# Patient Record
Sex: Male | Born: 1955 | Race: White | Hispanic: No | Marital: Single | Smoking: Never smoker
Health system: Southern US, Community
[De-identification: ages and names within clinical notes are randomized; demographics above are authoritative.]

## PROBLEM LIST (undated history)

## (undated) DIAGNOSIS — R6510 Systemic inflammatory response syndrome (SIRS) of non-infectious origin without acute organ dysfunction: Secondary | ICD-10-CM

## (undated) DIAGNOSIS — I4891 Unspecified atrial fibrillation: Secondary | ICD-10-CM

## (undated) DIAGNOSIS — M1990 Unspecified osteoarthritis, unspecified site: Secondary | ICD-10-CM

## (undated) DIAGNOSIS — G4730 Sleep apnea, unspecified: Secondary | ICD-10-CM

## (undated) DIAGNOSIS — I10 Essential (primary) hypertension: Secondary | ICD-10-CM

## (undated) DIAGNOSIS — I509 Heart failure, unspecified: Secondary | ICD-10-CM

## (undated) DIAGNOSIS — I499 Cardiac arrhythmia, unspecified: Secondary | ICD-10-CM

## (undated) DIAGNOSIS — D72829 Elevated white blood cell count, unspecified: Secondary | ICD-10-CM

## (undated) DIAGNOSIS — E119 Type 2 diabetes mellitus without complications: Secondary | ICD-10-CM

## (undated) DIAGNOSIS — N390 Urinary tract infection, site not specified: Secondary | ICD-10-CM

## (undated) DIAGNOSIS — E871 Hypo-osmolality and hyponatremia: Secondary | ICD-10-CM

## (undated) HISTORY — DX: Type 2 diabetes mellitus without complications: E11.9

## (undated) HISTORY — DX: Systemic inflammatory response syndrome (sirs) of non-infectious origin without acute organ dysfunction: R65.10

## (undated) HISTORY — DX: Elevated white blood cell count, unspecified: D72.829

## (undated) HISTORY — PX: TONSILLECTOMY: SUR1361

## (undated) HISTORY — PX: PENILE PROSTHESIS IMPLANT: SHX240

## (undated) HISTORY — PX: JOINT REPLACEMENT: SHX530

## (undated) HISTORY — DX: Cardiac arrhythmia, unspecified: I49.9

## (undated) HISTORY — PX: OTHER SURGICAL HISTORY: SHX169

## (undated) HISTORY — DX: Urinary tract infection, site not specified: N39.0

## (undated) HISTORY — DX: Essential (primary) hypertension: I10

## (undated) HISTORY — DX: Unspecified atrial fibrillation: I48.91

## (undated) HISTORY — DX: Hypo-osmolality and hyponatremia: E87.1

---

## 2004-11-02 ENCOUNTER — Other Ambulatory Visit: Payer: Self-pay

## 2004-11-03 ENCOUNTER — Other Ambulatory Visit: Payer: Self-pay

## 2004-11-03 ENCOUNTER — Inpatient Hospital Stay: Payer: Self-pay | Admitting: Anesthesiology

## 2007-09-05 ENCOUNTER — Other Ambulatory Visit: Payer: Self-pay

## 2007-09-05 ENCOUNTER — Ambulatory Visit: Payer: Self-pay | Admitting: Podiatry

## 2007-09-08 ENCOUNTER — Ambulatory Visit: Payer: Self-pay | Admitting: Podiatry

## 2010-03-20 ENCOUNTER — Ambulatory Visit: Payer: Self-pay | Admitting: Internal Medicine

## 2012-12-19 ENCOUNTER — Inpatient Hospital Stay: Payer: Self-pay | Admitting: Internal Medicine

## 2012-12-19 LAB — COMPREHENSIVE METABOLIC PANEL
Albumin: 2.1 g/dL — ABNORMAL LOW (ref 3.4–5.0)
Alkaline Phosphatase: 145 U/L — ABNORMAL HIGH (ref 50–136)
Anion Gap: 11 (ref 7–16)
Calcium, Total: 8.2 mg/dL — ABNORMAL LOW (ref 8.5–10.1)
Creatinine: 1.45 mg/dL — ABNORMAL HIGH (ref 0.60–1.30)
EGFR (Non-African Amer.): 53 — ABNORMAL LOW
Potassium: 4 mmol/L (ref 3.5–5.1)
SGOT(AST): 55 U/L — ABNORMAL HIGH (ref 15–37)
SGPT (ALT): 33 U/L (ref 12–78)
Total Protein: 7.2 g/dL (ref 6.4–8.2)

## 2012-12-19 LAB — URINALYSIS, COMPLETE
Glucose,UR: >=500 mg/dL (ref 0–75)
Nitrite: NEGATIVE
Protein: 100
Specific Gravity: 1.02 (ref 1.003–1.030)
Squamous Epithelial: <1
WBC UR: 409 /HPF (ref 0–5)

## 2012-12-19 LAB — CBC
HCT: 34.4 % — ABNORMAL LOW (ref 40.0–52.0)
RBC: 4.04 10*6/uL — ABNORMAL LOW (ref 4.40–5.90)

## 2012-12-19 LAB — LIPASE, BLOOD: Lipase: 66 U/L — ABNORMAL LOW (ref 73–393)

## 2012-12-19 LAB — PRO B NATRIURETIC PEPTIDE: B-Type Natriuretic Peptide: 2717 pg/mL — ABNORMAL HIGH (ref 0–125)

## 2012-12-19 LAB — CK TOTAL AND CKMB (NOT AT ARMC): CK, Total: 89 U/L (ref 35–232)

## 2012-12-20 DIAGNOSIS — I4891 Unspecified atrial fibrillation: Secondary | ICD-10-CM

## 2012-12-20 LAB — BASIC METABOLIC PANEL
Anion Gap: 11 (ref 7–16)
BUN: 20 mg/dL — ABNORMAL HIGH (ref 7–18)
Chloride: 91 mmol/L — ABNORMAL LOW (ref 98–107)
EGFR (African American): >60
Potassium: 3.4 mmol/L — ABNORMAL LOW (ref 3.5–5.1)
Sodium: 126 mmol/L — ABNORMAL LOW (ref 136–145)

## 2012-12-20 LAB — CBC WITH DIFFERENTIAL/PLATELET
Basophil #: 0.1 10*3/uL (ref 0.0–0.1)
Basophil %: 0.3 %
Eosinophil #: 0 10*3/uL (ref 0.0–0.7)
HCT: 37.9 % — ABNORMAL LOW (ref 40.0–52.0)
Lymphocyte #: 1.2 10*3/uL (ref 1.0–3.6)
MCH: 28.6 pg (ref 26.0–34.0)
MCHC: 33.2 g/dL (ref 32.0–36.0)
Monocyte #: 1.9 x10 3/mm — ABNORMAL HIGH (ref 0.2–1.0)
Monocyte %: 8 %
Neutrophil #: 20.6 10*3/uL — ABNORMAL HIGH (ref 1.4–6.5)
Neutrophil %: 86.3 %
Platelet: 240 10*3/uL (ref 150–440)
WBC: 23.8 10*3/uL — ABNORMAL HIGH (ref 3.8–10.6)

## 2012-12-20 LAB — TSH: Thyroid Stimulating Horm: 0.082 u[IU]/mL — ABNORMAL LOW

## 2012-12-21 DIAGNOSIS — I359 Nonrheumatic aortic valve disorder, unspecified: Secondary | ICD-10-CM

## 2012-12-21 LAB — BASIC METABOLIC PANEL
Anion Gap: 9 (ref 7–16)
BUN: 14 mg/dL (ref 7–18)
Co2: 25 mmol/L (ref 21–32)
EGFR (Non-African Amer.): >60
Osmolality: 263 (ref 275–301)
Potassium: 3.3 mmol/L — ABNORMAL LOW (ref 3.5–5.1)

## 2012-12-21 LAB — CBC WITH DIFFERENTIAL/PLATELET
Basophil #: 0 10*3/uL (ref 0.0–0.1)
Basophil %: 0.4 %
Eosinophil %: 0.9 %
HCT: 31.9 % — ABNORMAL LOW (ref 40.0–52.0)
HGB: 10.7 g/dL — ABNORMAL LOW (ref 13.0–18.0)
Lymphocyte #: 0.9 10*3/uL — ABNORMAL LOW (ref 1.0–3.6)
Lymphocyte %: 7.4 %
MCH: 28.8 pg (ref 26.0–34.0)
Monocyte %: 9.9 %
Neutrophil #: 9.6 10*3/uL — ABNORMAL HIGH (ref 1.4–6.5)
Platelet: 167 10*3/uL (ref 150–440)
RBC: 3.73 10*6/uL — ABNORMAL LOW (ref 4.40–5.90)

## 2012-12-21 LAB — MAGNESIUM: Magnesium: 1.6 mg/dL — ABNORMAL LOW

## 2012-12-21 LAB — T4, FREE: Free Thyroxine: 1.47 ng/dL — ABNORMAL HIGH (ref 0.76–1.46)

## 2012-12-22 DIAGNOSIS — I4891 Unspecified atrial fibrillation: Secondary | ICD-10-CM

## 2012-12-22 LAB — BASIC METABOLIC PANEL
Anion Gap: 6 — ABNORMAL LOW (ref 7–16)
BUN: 8 mg/dL (ref 7–18)
Calcium, Total: 7.7 mg/dL — ABNORMAL LOW (ref 8.5–10.1)
Chloride: 99 mmol/L (ref 98–107)
Creatinine: 0.78 mg/dL (ref 0.60–1.30)
EGFR (Non-African Amer.): >60
Sodium: 132 mmol/L — ABNORMAL LOW (ref 136–145)

## 2012-12-22 LAB — CBC WITH DIFFERENTIAL/PLATELET
Eosinophil #: 0.1 10*3/uL (ref 0.0–0.7)
HCT: 32.8 % — ABNORMAL LOW (ref 40.0–52.0)
Lymphocyte #: 0.9 10*3/uL — ABNORMAL LOW (ref 1.0–3.6)
MCH: 28.6 pg (ref 26.0–34.0)
MCHC: 33.4 g/dL (ref 32.0–36.0)
MCV: 86 fL (ref 80–100)
Monocyte #: 1.1 x10 3/mm — ABNORMAL HIGH (ref 0.2–1.0)
Neutrophil #: 8 10*3/uL — ABNORMAL HIGH (ref 1.4–6.5)
Neutrophil %: 78 %
Platelet: 198 10*3/uL (ref 150–440)
RBC: 3.83 10*6/uL — ABNORMAL LOW (ref 4.40–5.90)
RDW: 14.8 % — ABNORMAL HIGH (ref 11.5–14.5)

## 2012-12-22 LAB — STOOL CULTURE

## 2012-12-22 LAB — MAGNESIUM: Magnesium: 1.5 mg/dL — ABNORMAL LOW

## 2012-12-23 LAB — BASIC METABOLIC PANEL
BUN: 7 mg/dL (ref 7–18)
Calcium, Total: 8.2 mg/dL — ABNORMAL LOW (ref 8.5–10.1)
Chloride: 101 mmol/L (ref 98–107)
Co2: 27 mmol/L (ref 21–32)
Creatinine: 0.76 mg/dL (ref 0.60–1.30)
EGFR (Non-African Amer.): >60
Glucose: 92 mg/dL (ref 65–99)
Sodium: 134 mmol/L — ABNORMAL LOW (ref 136–145)

## 2012-12-23 LAB — CBC WITH DIFFERENTIAL/PLATELET
Basophil #: 0.1 10*3/uL (ref 0.0–0.1)
Basophil %: 1 %
Eosinophil #: 0.3 10*3/uL (ref 0.0–0.7)
Eosinophil %: 2.5 %
HCT: 33.4 % — ABNORMAL LOW (ref 40.0–52.0)
HGB: 11.2 g/dL — ABNORMAL LOW (ref 13.0–18.0)
MCH: 29.1 pg (ref 26.0–34.0)
Monocyte #: 1.1 x10 3/mm — ABNORMAL HIGH (ref 0.2–1.0)
Monocyte %: 10.1 %
Neutrophil #: 8.1 10*3/uL — ABNORMAL HIGH (ref 1.4–6.5)
Neutrophil %: 71.6 %
RBC: 3.85 10*6/uL — ABNORMAL LOW (ref 4.40–5.90)
RDW: 15 % — ABNORMAL HIGH (ref 11.5–14.5)
WBC: 11.3 10*3/uL — ABNORMAL HIGH (ref 3.8–10.6)

## 2012-12-23 LAB — MAGNESIUM: Magnesium: 1.6 mg/dL — ABNORMAL LOW

## 2012-12-23 LAB — DIGOXIN LEVEL: Digoxin: 0.77 ng/mL

## 2012-12-24 LAB — T4, FREE: Free Thyroxine: 1.08 ng/dL (ref 0.76–1.46)

## 2012-12-24 LAB — TSH: Thyroid Stimulating Horm: 0.615 u[IU]/mL

## 2012-12-25 DIAGNOSIS — I359 Nonrheumatic aortic valve disorder, unspecified: Secondary | ICD-10-CM

## 2012-12-25 LAB — BASIC METABOLIC PANEL
BUN: 10 mg/dL (ref 7–18)
Calcium, Total: 8.7 mg/dL (ref 8.5–10.1)
Co2: 30 mmol/L (ref 21–32)
EGFR (Non-African Amer.): >60
Glucose: 66 mg/dL (ref 65–99)
Osmolality: 273 (ref 275–301)
Sodium: 138 mmol/L (ref 136–145)

## 2012-12-25 LAB — CBC WITH DIFFERENTIAL/PLATELET
Basophil #: 0.1 10*3/uL (ref 0.0–0.1)
Eosinophil #: 0.4 10*3/uL (ref 0.0–0.7)
Eosinophil %: 3.3 %
HCT: 35 % — ABNORMAL LOW (ref 40.0–52.0)
Lymphocyte #: 1.4 10*3/uL (ref 1.0–3.6)
Lymphocyte %: 12.1 %
MCH: 28.6 pg (ref 26.0–34.0)
MCHC: 33 g/dL (ref 32.0–36.0)
MCV: 87 fL (ref 80–100)
Monocyte #: 1 x10 3/mm (ref 0.2–1.0)
Monocyte %: 8.1 %
Neutrophil #: 8.9 10*3/uL — ABNORMAL HIGH (ref 1.4–6.5)
Platelet: 385 10*3/uL (ref 150–440)
RBC: 4.03 10*6/uL — ABNORMAL LOW (ref 4.40–5.90)
WBC: 11.8 10*3/uL — ABNORMAL HIGH (ref 3.8–10.6)

## 2012-12-25 LAB — MAGNESIUM: Magnesium: 1.9 mg/dL

## 2012-12-26 ENCOUNTER — Telehealth: Payer: Self-pay

## 2012-12-26 LAB — CULTURE, BLOOD (SINGLE)

## 2012-12-26 NOTE — Telephone Encounter (Signed)
tcm

## 2012-12-26 NOTE — Telephone Encounter (Signed)
Message copied by [facility], Idella Lamontagne E on Tue Dec 26, 2012  2:04 PM ------      Message from: Coralee Rud      Created: Tue Dec 26, 2012  1:58 PM      Regarding: tcm       appt 01/04/13 ------

## 2012-12-27 NOTE — Telephone Encounter (Signed)
TCM I was able to speak with pt re: recent d/c from [facility] for atrial fib He confirms compliance with medications as prescribed He was d/c yesterday and is on his way to pharmacy now to get RXs Denies sob, palpitations or CP Has HH nurse coming to home today between 3-4 pm for IV abx admin. Teaching He confirms knowledge re: appt with [place] 4/17 He will call us prior to appt should he have any other concerns

## 2013-01-04 ENCOUNTER — Encounter: Payer: Self-pay | Admitting: Cardiovascular Disease

## 2013-01-04 ENCOUNTER — Ambulatory Visit (INDEPENDENT_AMBULATORY_CARE_PROVIDER_SITE_OTHER): Payer: BC Managed Care – PPO | Admitting: Cardiovascular Disease

## 2013-01-04 VITALS — BP 150/80 | HR 69 | Ht 75.0 in | Wt 263.8 lb

## 2013-01-04 DIAGNOSIS — R7881 Bacteremia: Secondary | ICD-10-CM | POA: Insufficient documentation

## 2013-01-04 DIAGNOSIS — R6510 Systemic inflammatory response syndrome (SIRS) of non-infectious origin without acute organ dysfunction: Secondary | ICD-10-CM

## 2013-01-04 DIAGNOSIS — I4891 Unspecified atrial fibrillation: Secondary | ICD-10-CM

## 2013-01-04 DIAGNOSIS — E119 Type 2 diabetes mellitus without complications: Secondary | ICD-10-CM

## 2013-01-04 DIAGNOSIS — I10 Essential (primary) hypertension: Secondary | ICD-10-CM | POA: Insufficient documentation

## 2013-01-04 MED ORDER — METOPROLOL TARTRATE 25 MG PO TABS: 25.0000 mg | ORAL_TABLET | Freq: Two times a day (BID) | ORAL | Status: DC

## 2013-01-04 MED ORDER — GLIPIZIDE 5 MG PO TABS: 5.0000 mg | ORAL_TABLET | Freq: Two times a day (BID) | ORAL | Status: DC

## 2013-01-04 MED ORDER — HYDROCHLOROTHIAZIDE 25 MG PO TABS: 50.0000 mg | ORAL_TABLET | Freq: Every day | ORAL | Status: DC

## 2013-01-04 MED ORDER — DILTIAZEM HCL ER BEADS 300 MG PO CP24: 300.0000 mg | ORAL_CAPSULE | Freq: Every day | ORAL | Status: DC

## 2013-01-04 MED ORDER — ENALAPRIL MALEATE 20 MG PO TABS: 20.0000 mg | ORAL_TABLET | Freq: Every day | ORAL | Status: DC

## 2013-01-04 MED ORDER — INSULIN DETEMIR 100 UNIT/ML FLEXPEN: 30.0000 [IU] | Freq: Every day | SUBCUTANEOUS | Status: DC

## 2013-01-04 MED ORDER — AMIODARONE HCL 200 MG PO TABS: 200.0000 mg | ORAL_TABLET | Freq: Every day | ORAL | Status: DC

## 2013-01-04 NOTE — Assessment & Plan Note (Signed)
Upper respiratory infection followed by an GI pathology, severe diarrhea. Bacteremia and hypotension  Requiring antibiotics.

## 2013-01-04 NOTE — Assessment & Plan Note (Signed)
Maintaining normal sinus rhythm. We will decrease the amiodarone to 200 mg daily, continue diltiazem and metoprolol. We will hold the anticoagulation at the end of the month as this was a single episode of atrial fibrillation that was short lived and converted on medication.

## 2013-01-04 NOTE — Assessment & Plan Note (Signed)
Continue current medications. We have encouraged he watch his blood pressure at home.

## 2013-01-04 NOTE — Assessment & Plan Note (Signed)
Severe illness recently with SIRS. Improved on antibiotics. TEE suggesting no endocarditis. He did have a PICC line which was recently removed after antibiotics was complete.

## 2013-01-04 NOTE — Progress Notes (Signed)
Patient ID: Raymond Avery, male    DOB: Feb 10, 1956, 57 y.o.   MRN: [id number]  HPI Comments: Mr. Benish is a very pleasant 57 year old gentleman currently with no primary care physician, poorly controlled diabetes who presented to the hospital 12/19/2012 with diarrhea, dysuria, SIRS, developing atrial fibrillation with RVR on April 2. We will consult for management of his arrhythmia. He presents to the clinic today to establish care.  As an outpatient, he had upper respiratory infection, took his son's amoxicillin. Developed diarrhea that was profuse with a dark color. Went to urgent care and was sent to the emergency room. He was tachycardic with heart rates in the 120-130 range, dehydration with hyponatremia, urinary tract infection by report. He was given IV fluids. Developed atrial fibrillation with RVR. He was started on diltiazem IV and digoxin for heart rate of 140. Magnesium and potassium was repleted, he was started on amiodarone as rate did not improve. He converted to normal sinus rhythm and was continued on these medications as an outpatient.  Echocardiogram done in the hospital showed severe asymmetric LVH septal greater than posterior wall, moderately dilated left atrium, ejection fraction 50-55%.  TEE was performed to rule out endocarditis. This did not show any vegetation  Cardiac catheterization February 2006 showing no significant CAD, ejection fraction 40%  Today he reports that he feels well. He is unable to afford the xarelto which cost him $100. He is also on insulin which is costing him $100. He denies any tachycardia or palpitations.  EKG today showing normal sinus rhythm with rate 69 beats per minute, no significant ST or T wave changes, left axis deviation   Outpatient Encounter Prescriptions as of 01/04/2013  Medication Sig Dispense Refill  . amiodarone (PACERONE) 200 MG tablet Take 1 tablet (200 mg total) by mouth daily.  90 tablet  3  . diltiazem (TIAZAC) 300 MG 24  hr capsule Take 1 capsule (300 mg total) by mouth daily.  90 capsule  3  . enalapril (VASOTEC) 20 MG tablet Take 1 tablet (20 mg total) by mouth daily.  90 tablet  3  . glipiZIDE (GLUCOTROL) 5 MG tablet Take 1 tablet (5 mg total) by mouth 2 (two) times daily before a meal.  180 tablet  3  . hydrochlorothiazide (HYDRODIURIL) 25 MG tablet Take 2 tablets (50 mg total) by mouth daily.  90 tablet  3  . insulin detemir (LEVEMIR) 100 unit/ml SOLN Inject 30 Units into the skin daily at 10 pm.  1 mL  6  . metoprolol tartrate (LOPRESSOR) 25 MG tablet Take 1 tablet (25 mg total) by mouth 2 (two) times daily.  180 tablet  3  . Rivaroxaban (XARELTO) 20 MG TABS Take 20 mg by mouth daily.      Marland Kitchen  amiodarone (PACERONE) 400 MG tablet Take 400 mg by mouth daily.      . [DISCONTINUED] dextrose 5 % SOLN 50 mL with cefTRIAXone 2 G SOLR 2 g Inject 2 g into the vein daily.      . [DISCONTINUED] diltiazem (TIAZAC) 300 MG 24 hr capsule Take 300 mg by mouth daily.      . [DISCONTINUED] enalapril (VASOTEC) 20 MG tablet Take 20 mg by mouth daily.      . [DISCONTINUED] glipiZIDE (GLUCOTROL) 5 MG tablet Take 5 mg by mouth 2 (two) times daily before a meal.      . [DISCONTINUED] hydrochlorothiazide (HYDRODIURIL) 25 MG tablet Take 50 mg by mouth daily.       . [  DISCONTINUED] insulin detemir (LEVEMIR) 100 unit/ml SOLN Inject 30 Units into the skin daily at 10 pm.      . [DISCONTINUED] metoprolol tartrate (LOPRESSOR) 25 MG tablet Take 25 mg by mouth 2 (two) times daily.       No facility-administered encounter medications on file as of 01/04/2013.     Review of Systems  Constitutional: Negative.   HENT: Negative.   Eyes: Negative.   Respiratory: Negative.   Cardiovascular: Negative.   Gastrointestinal: Negative.   Musculoskeletal: Negative.   Skin: Negative.   Neurological: Negative.   Psychiatric/Behavioral: Negative.   All other systems reviewed and are negative.    BP 150/80  Pulse 69  Ht 6\' 3"  (1.905 m)  Wt  263 lb 12 oz (119.636 kg)  BMI 32.97 kg/m2  Physical Exam  Nursing note and vitals reviewed. Constitutional: He is oriented to person, place, and time. He appears well-developed and well-nourished.  HENT:  Head: Normocephalic.  Nose: Nose normal.  Mouth/Throat: Oropharynx is clear and moist.  Eyes: Conjunctivae are normal. Pupils are equal, round, and reactive to light.  Neck: Normal range of motion. Neck supple. No JVD present.  Cardiovascular: Normal rate, regular rhythm, S1 normal, S2 normal, normal heart sounds and intact distal pulses.  Exam reveals no gallop and no friction rub.   No murmur heard. Pulmonary/Chest: Effort normal and breath sounds normal. No respiratory distress. He has no wheezes. He has no rales. He exhibits no tenderness.  Abdominal: Soft. Bowel sounds are normal. He exhibits no distension. There is no tenderness.  Musculoskeletal: Normal range of motion. He exhibits no edema and no tenderness.  Lymphadenopathy:    He has no cervical adenopathy.  Neurological: He is alert and oriented to person, place, and time. Coordination normal.  Skin: Skin is warm and dry. No rash noted. No erythema.  Psychiatric: He has a normal mood and affect. His behavior is normal. Judgment and thought content normal.      Assessment and Plan

## 2013-01-04 NOTE — Patient Instructions (Addendum)
You are doing well. Please decrease amiodarone to 1/2 daily (200 mg)  Please stop the xarelto once the script runs out  Please call us if you have new issues that need to be addressed before your next appt.  Your physician wants you to follow-up in: 6 months.  You will receive a reminder letter in the mail two months in advance. If you don't receive a letter, please call our office to schedule the follow-up appointment.

## 2013-01-04 NOTE — Assessment & Plan Note (Addendum)
He'll continue on insulin for now. We have suggested he try to obtain a primary care physician in the very near future for management of his diabetes.we will renew his medications and encouraged him to actively call primary care physicians in [place] or [place] for further management.

## 2013-01-19 ENCOUNTER — Other Ambulatory Visit: Payer: Self-pay

## 2013-01-19 MED ORDER — ENALAPRIL MALEATE 20 MG PO TABS: 20.0000 mg | ORAL_TABLET | Freq: Every day | ORAL | Status: DC

## 2013-01-19 MED ORDER — GLIPIZIDE 5 MG PO TABS: 5.0000 mg | ORAL_TABLET | Freq: Two times a day (BID) | ORAL | Status: DC

## 2013-01-19 MED ORDER — INSULIN DETEMIR 100 UNIT/ML FLEXPEN: 30.0000 [IU] | Freq: Every day | SUBCUTANEOUS | Status: DC

## 2013-01-19 MED ORDER — HYDROCHLOROTHIAZIDE 25 MG PO TABS: 50.0000 mg | ORAL_TABLET | Freq: Every day | ORAL | Status: DC

## 2013-01-19 MED ORDER — METOPROLOL TARTRATE 25 MG PO TABS: 25.0000 mg | ORAL_TABLET | Freq: Two times a day (BID) | ORAL | Status: DC

## 2013-01-19 NOTE — Telephone Encounter (Signed)
Refilled Enalapril, Levemir, glipizide, Hydrochlorothiazide, and metoprolol sent to [facility].

## 2013-01-19 NOTE — Telephone Encounter (Signed)
Pt wants Levemir switched to generic. States he went to [facility] and these were not filled.

## 2013-05-14 ENCOUNTER — Inpatient Hospital Stay: Payer: Self-pay | Admitting: Podiatry

## 2013-05-14 DIAGNOSIS — Z01810 Encounter for preprocedural cardiovascular examination: Secondary | ICD-10-CM

## 2013-05-14 LAB — CBC WITH DIFFERENTIAL/PLATELET
Eosinophil %: 3.2 %
HCT: 36.2 % — ABNORMAL LOW (ref 40.0–52.0)
Lymphocyte %: 15.4 %
MCH: 29.9 pg (ref 26.0–34.0)
Monocyte %: 7.7 %
Neutrophil #: 7.7 10*3/uL — ABNORMAL HIGH (ref 1.4–6.5)
Neutrophil %: 73 %
Platelet: 365 10*3/uL (ref 150–440)

## 2013-05-14 LAB — BASIC METABOLIC PANEL
BUN: 9 mg/dL (ref 7–18)
Chloride: 103 mmol/L (ref 98–107)
EGFR (African American): >60
EGFR (Non-African Amer.): >60
Glucose: 129 mg/dL — ABNORMAL HIGH (ref 65–99)
Osmolality: 276 (ref 275–301)
Potassium: 4.3 mmol/L (ref 3.5–5.1)

## 2013-05-15 LAB — LIPID PANEL
Ldl Cholesterol, Calc: 62 mg/dL (ref 0–100)
Triglycerides: 75 mg/dL (ref 0–200)

## 2013-05-16 LAB — CBC WITH DIFFERENTIAL/PLATELET
Basophil #: 0.1 10*3/uL (ref 0.0–0.1)
Basophil %: 0.5 %
Eosinophil #: 0.2 10*3/uL (ref 0.0–0.7)
Eosinophil %: 1.9 %
Lymphocyte #: 1.4 10*3/uL (ref 1.0–3.6)
Lymphocyte %: 12.4 %
MCV: 87 fL (ref 80–100)
Monocyte #: 1.5 x10 3/mm — ABNORMAL HIGH (ref 0.2–1.0)
Neutrophil %: 72.1 %
RDW: 13.6 % (ref 11.5–14.5)
WBC: 11.6 10*3/uL — ABNORMAL HIGH (ref 3.8–10.6)

## 2013-05-17 LAB — BASIC METABOLIC PANEL
Anion Gap: 8 (ref 7–16)
Chloride: 101 mmol/L (ref 98–107)
Co2: 25 mmol/L (ref 21–32)
Creatinine: 1.14 mg/dL (ref 0.60–1.30)
Glucose: 178 mg/dL — ABNORMAL HIGH (ref 65–99)
Osmolality: 272 (ref 275–301)
Sodium: 134 mmol/L — ABNORMAL LOW (ref 136–145)

## 2013-05-17 LAB — CBC WITH DIFFERENTIAL/PLATELET
Eosinophil #: 0.3 10*3/uL (ref 0.0–0.7)
HGB: 13.3 g/dL (ref 13.0–18.0)
Lymphocyte #: 1.7 10*3/uL (ref 1.0–3.6)
Lymphocyte %: 17.7 %
MCH: 30.6 pg (ref 26.0–34.0)
MCV: 87 fL (ref 80–100)
Monocyte #: 1.2 x10 3/mm — ABNORMAL HIGH (ref 0.2–1.0)
Monocyte %: 12.9 %
Neutrophil #: 6.2 10*3/uL (ref 1.4–6.5)
Neutrophil %: 65.4 %
Platelet: 314 10*3/uL (ref 150–440)
RBC: 4.34 10*6/uL — ABNORMAL LOW (ref 4.40–5.90)
RDW: 13.7 % (ref 11.5–14.5)
WBC: 9.5 10*3/uL (ref 3.8–10.6)

## 2013-05-17 LAB — PATHOLOGY REPORT

## 2013-05-18 LAB — CBC WITH DIFFERENTIAL/PLATELET
Basophil #: 0.1 10*3/uL (ref 0.0–0.1)
Basophil %: 0.7 %
Eosinophil #: 0.5 10*3/uL (ref 0.0–0.7)
Lymphocyte %: 21.6 %
MCH: 29.9 pg (ref 26.0–34.0)
MCHC: 34.6 g/dL (ref 32.0–36.0)
MCV: 86 fL (ref 80–100)
Monocyte #: 1.2 x10 3/mm — ABNORMAL HIGH (ref 0.2–1.0)
Monocyte %: 13.5 %
Neutrophil #: 5.2 10*3/uL (ref 1.4–6.5)
Platelet: 355 10*3/uL (ref 150–440)
RDW: 13.6 % (ref 11.5–14.5)
WBC: 8.8 10*3/uL (ref 3.8–10.6)

## 2013-05-18 LAB — CREATININE, SERUM: EGFR (Non-African Amer.): >60

## 2013-05-20 LAB — WOUND CULTURE

## 2013-08-16 ENCOUNTER — Other Ambulatory Visit: Payer: Self-pay | Admitting: Cardiovascular Disease

## 2013-09-25 ENCOUNTER — Observation Stay: Payer: Self-pay | Admitting: Internal Medicine

## 2013-09-25 LAB — COMPREHENSIVE METABOLIC PANEL
ALK PHOS: 89 U/L
ALT: 20 U/L (ref 12–78)
Albumin: 3.5 g/dL (ref 3.4–5.0)
Anion Gap: 4 — ABNORMAL LOW (ref 7–16)
BUN: 13 mg/dL (ref 7–18)
Bilirubin,Total: 0.8 mg/dL (ref 0.2–1.0)
CO2: 28 mmol/L (ref 21–32)
CREATININE: 1.07 mg/dL (ref 0.60–1.30)
Calcium, Total: 9.4 mg/dL (ref 8.5–10.1)
Chloride: 101 mmol/L (ref 98–107)
EGFR (African American): >60
EGFR (Non-African Amer.): >60
Glucose: 158 mg/dL — ABNORMAL HIGH (ref 65–99)
OSMOLALITY: 270 (ref 275–301)
POTASSIUM: 4.2 mmol/L (ref 3.5–5.1)
SGOT(AST): 22 U/L (ref 15–37)
Sodium: 133 mmol/L — ABNORMAL LOW (ref 136–145)
Total Protein: 8 g/dL (ref 6.4–8.2)

## 2013-09-25 LAB — CBC
HCT: 49.8 % (ref 40.0–52.0)
HGB: 17.1 g/dL (ref 13.0–18.0)
MCH: 30.7 pg (ref 26.0–34.0)
MCHC: 34.3 g/dL (ref 32.0–36.0)
MCV: 90 fL (ref 80–100)
Platelet: 251 10*3/uL (ref 150–440)
RBC: 5.55 10*6/uL (ref 4.40–5.90)
RDW: 13 % (ref 11.5–14.5)
WBC: 8.7 10*3/uL (ref 3.8–10.6)

## 2013-11-16 DIAGNOSIS — N182 Chronic kidney disease, stage 2 (mild): Secondary | ICD-10-CM

## 2013-11-16 DIAGNOSIS — E1122 Type 2 diabetes mellitus with diabetic chronic kidney disease: Secondary | ICD-10-CM | POA: Insufficient documentation

## 2013-12-01 DIAGNOSIS — I10 Essential (primary) hypertension: Secondary | ICD-10-CM

## 2013-12-01 DIAGNOSIS — E1159 Type 2 diabetes mellitus with other circulatory complications: Secondary | ICD-10-CM | POA: Insufficient documentation

## 2013-12-01 DIAGNOSIS — I152 Hypertension secondary to endocrine disorders: Secondary | ICD-10-CM | POA: Insufficient documentation

## 2013-12-03 DIAGNOSIS — R809 Proteinuria, unspecified: Secondary | ICD-10-CM | POA: Insufficient documentation

## 2014-01-27 IMAGING — XA IR VASCULAR PROCEDURE
1 series · 1 of 1 positions shown · non-contrast
Comparison: none

[Series 1: single · 1 of 1 slices shown]
[im 1/1]
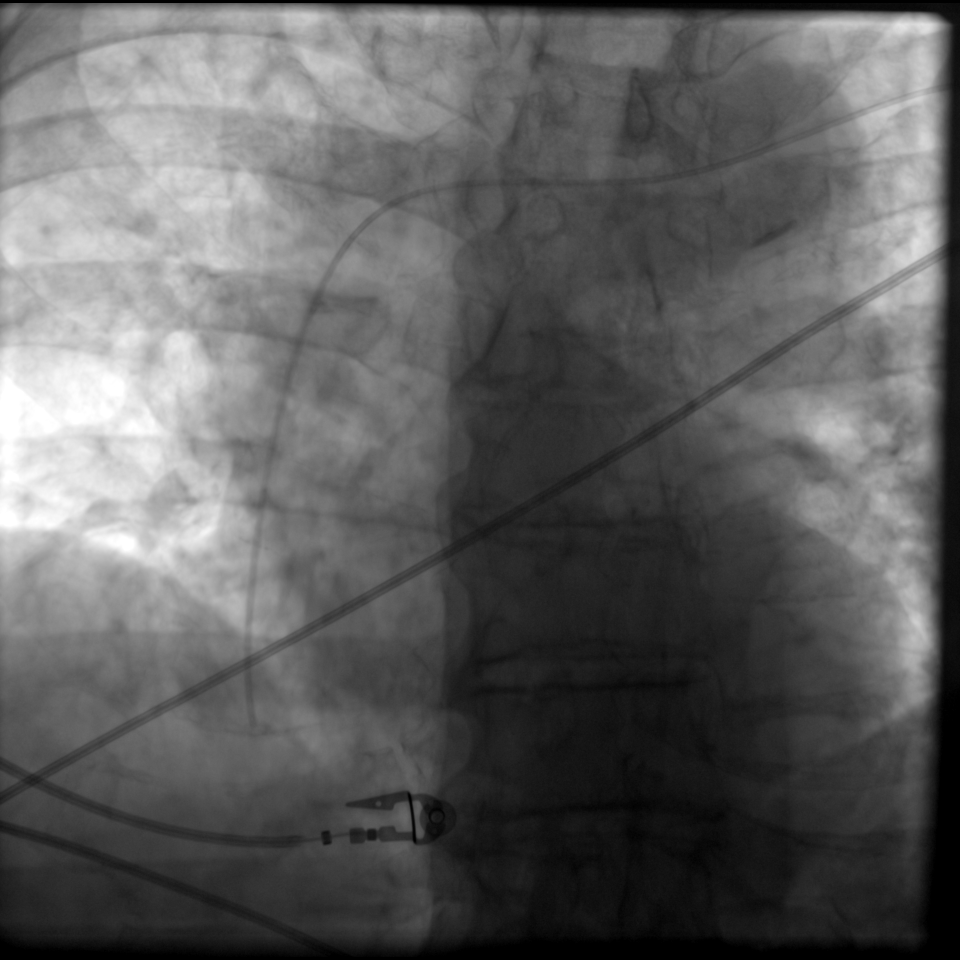

[1 of 1 positions shown; findings below may reference images not displayed]

IMAGES IMPORTED FROM THE SYNGO WORKFLOW SYSTEM
NO DICTATION FOR STUDY

## 2014-04-16 DIAGNOSIS — N529 Male erectile dysfunction, unspecified: Secondary | ICD-10-CM | POA: Insufficient documentation

## 2014-05-24 DIAGNOSIS — M1710 Unilateral primary osteoarthritis, unspecified knee: Secondary | ICD-10-CM | POA: Insufficient documentation

## 2014-08-19 DIAGNOSIS — E1169 Type 2 diabetes mellitus with other specified complication: Secondary | ICD-10-CM | POA: Insufficient documentation

## 2014-08-19 DIAGNOSIS — N521 Erectile dysfunction due to diseases classified elsewhere: Secondary | ICD-10-CM

## 2015-01-10 NOTE — Op Note (Signed)
PATIENT NAME:  Raymond Avery, Raymond Avery MR#:  [id number] DATE OF BIRTH:  01-22-1956  DATE OF PROCEDURE:  05/15/2013  PREOPERATIVE DIAGNOSIS: Gangrene with osteomyelitis, right great toe.   POSTOPERATIVE DIAGNOSIS: Gangrene with osteomyelitis, right great toe.   PROCEDURE: Amputation, right great toe.   SURGEON: Ricci Barkerodd W. Dashiell Franchino, DPM.   ANESTHESIA: Local MAC.   HEMOSTASIS: Pneumatic tourniquet, right ankle, 250 mmHg.   ESTIMATED BLOOD LOSS: 25 mL.   PATHOLOGY: Gangrenous right great toe.   CULTURES: Bone cultures proximal phalanx, right hallux.   DRAINS: None.   COMPLICATIONS: None apparent.   OPERATIVE INDICATIONS: This is a 59 year old, noncompliant diabetic male with a long history of ulceration which subsequently went into gangrene and osteomyelitis. He was admitted for surgical amputation and IV antibiotics.   OPERATIVE PROCEDURE: The patient was taken to the Operating Room and placed on the table in the supine position. Following satisfactory sedation, the right foot was anesthetized with 18 mL of 0.5% Sensorcaine plain around the first metatarsal. A pneumatic tourniquet was applied at the level of the right ankle and the foot was prepped and draped in the usual sterile fashion. The foot was exsanguinated and the tourniquet inflated to 250 mmHg.   Attention was then directed to the distal aspect of the right foot where a fishmouth incision was made coursing medial to lateral around the base of the great toe. The incision was carried sharply down to the level of bone and dissection carried back to the level of the metatarsophalangeal joint, where the toe was completely disarticulated and removed. There was noted to be good healthy bleeding tissues for the entirety of the wound. Some arthritic changes were noted at the first MTPJ and some bony prominence was removed from medial, lateral and dorsal aspect of the joint. The wound was then flushed with copious amounts of sterile saline containing GU  irrigation and then closed using 4-0 nylon vertical mattress and simple interrupted sutures. Xeroform, 4 x 4's, ABD and Kerlix were then applied to the right foot followed by an Ace wrap. The tourniquet was released and blood flow noted to return immediately to the right foot. The patient tolerated the procedure and anesthesia well and was transported to the PACU with vital signs stable and in good condition.    ____________________________ Raymond Avery Taneia Mealor, DPM tc:jm D: 05/15/2013 13:29:40 ET T: 05/15/2013 14:10:36 ET JOB#: [id number]  cc: Raymond Avery Randie Bloodgood, DPM, <Dictator> Kurstyn Larios DPM ELECTRONICALLY SIGNED 05/26/2013 10:01

## 2015-01-10 NOTE — Consult Note (Signed)
PATIENT NAME:  Raymond Avery, Raymond Avery MR#:  [id number] DATE OF BIRTH:  January 08, 1956  DATE OF CONSULTATION:  05/17/2013  REFERRING PHYSICIAN:  Dr. Cleda Mccreedy in podiatry CONSULTING PHYSICIAN:  Cheral Marker. Ola Spurr, MD  REASON FOR CONSULT:  Osteomyelitis status post amputation of great toe.   HISTORY OF PRESENT ILLNESS: This is a 59 year old, poorly controlled diabetic who has no primary care physician. He developed an ulceration on his right great toe that started to become infected. He was initially treated with amoxicillin, but the lesion turned black and he was seen by podiatry.  He was admitted with August 25 and underwent amputation of the great toe on August 26. Cultures are sent and are pending and are mixed. We are consulted for further antibiotic management. Since then, the wound has been dressed and apparently has been stable. He has had no fevers. He just really wants to go home tomorrow and hopes she can be treated with oral antibiotic as opposed to IV.  He states he had IV PICC line in April of this year, but I find no record of this and he has no further recollection of what was done at that time.   PAST MEDICAL HISTORY: 1.  Poorly controlled diabetes.  2.  Hypertension.   3.  History of atrial fibrillation.   PAST SURGICAL HISTORY:  None.     SOCIAL HISTORY: The patient quit smoking many years ago. He drinks 2 to 4 beers daily. He lives at home with his son. He is a Designer, fashion/clothing.   FAMILY HISTORY: Positive for mother with myocardial infarction, father with colon cancer.   REVIEW OF SYSTEMS:  Eleven systems reviewed and negative, except as per HPI.    CURRENT ANTIBIOTICS:  Current antibiotics include vancomycin began on August 25.     OTHER MEDICATIONS: Include: Amiodarone, enalapril, hydralazine, sliding scale insulin, metoprolol, morphine, hydrochlorothiazide and amlodipine.    allergies: the patient is listed as being allergic to only oxycodone, which causes nausea, vomiting  ALLERGIES:  THE PATIENT IS LISTED AS BEING TO ALLERGIC TO ONLY OXYCODONE, WHICH CAUSES NAUSEA AND VOMITING.   PHYSICAL EXAMINATION: VITAL SIGNS: T-max 98.4, pulse 63, blood pressure 165/88 respirations 17, sats 95% in room air.  GENERAL: He is lying in bed. He was unpleasant and grumpy during the exam.   HEENT: Pupils equal, round, reactive to light and accommodation. Extraocular movements are intact. Sclerae are anicteric.  Oropharynx is clear with no thrush.  NECK: Supple.  HEART:  Regular rate and rhythm.   ABDOMEN: Soft, nontender, nondistended. No organomegaly.  EXTREMITIES: Trace edema bilaterally. His right leg was wrapped postoperative. No drainage on the dressings. It did not remove the dressings.    LABORATORY, DIAGNOSTIC AND RADIOLOGIC DATA:  Renal function was normal with a creatinine of 1.14. White blood count is currently 9.5, down from 11.6 on August 27. Hemoglobin is 13.4, platelets 314. No ESR is available. Culture from the wound done on August 26 operatively reveals moderate growth of staph aureus, moderate 2 gram-negative rods. No sensitivities are yet available. Pathology from the surgery reveals necrotic toe with osteomyelitis. The proximal soft tissue margin appeared viable. The metatarsal had reactive periosteal changes and osteoarthritis.   IMPRESSION: A 59 year old with poorly controlled diabetic who is now two days status post amputation of his right great toe for gangrene. Cultures were mixed. He is currently on vancomycin, although he does have gram-negative growing in his culture.    1.  He does seem to have a good  blood supply per operative note reviewed. He also had a relatively viable margin for pathology review. I believe he can likely be treated with oral antibiotics as most of the infected bone has been removed. I would defer local wound care to Dr. Cleda Mccreedy. Pending culture results, I would recommend we should start him on Zosyn while he is hospitalized. Once we get sensitivities  to the staph aureus and the 2 E. coli's, we can  tailor an oral antibiotic regimen.  I would also continue the vancomycin for now.  2.  He would likely need a 2 to 4 week course of oral antibiotics depending on how well his wound heals.  I can see him as an outpatient to help follow along with Dr. Cleda Mccreedy.  3.  I will be out of the office until Tuesday, September 2, but  please call with questions [phone]. Thank you for the consult   ____________________________ Cheral Marker. Ola Spurr, MD dpf:cc D: 05/17/2013 22:09:49 ET T: 05/17/2013 23:03:31 ET JOB#: [id number]  cc: Cheral Marker. Ola Spurr, MD, <Dictator> Susa Bones Ola Spurr MD ELECTRONICALLY SIGNED 05/21/2013 11:51

## 2015-01-10 NOTE — Discharge Summary (Signed)
PATIENT NAME:  Raymond Avery, Raymond Avery MR#:  [id number] DATE OF BIRTH:  1956-05-12  DATE OF ADMISSION:  05/14/2013 DATE OF DISCHARGE:  05/18/2013   PRINCIPAL DIAGNOSIS: Gangrene with osteomyelitis, right great toe.   SECONDARY DIAGNOSIS: Non-insulin-dependent diabetes, poorly controlled.   PROCEDURES: Amputation right great toe, 05/15/2013.   CONSULTS:  1.  [facility], internal medicine, for daily medical management.  2.  Infectious disease, Dr. Sampson GoonFitzgerald.   HOSPITAL COURSE: The patient was admitted on 08/25/2014m initiated on vancomycin. The patient was stabilized and cleared medically for surgery, which was performed on 05/15/2013. Uneventful amputation of the right great toe was performed. Postoperatively, the incision remained coapted with moderate bleeding but reduction of the erythema and edema. Admission white blood cell count was 10.5 with a slight left shift. Postoperatively, white count continued to reduce after initial spike the second day down to 8.8 on discharge with reduction of the left shift. 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 The patient was stabilized for discharge on oral antibiotics after consultation with Dr. Sampson GoonFitzgerald on 05/18/2013.   DISCHARGE INSTRUCTIONS: 1.  The patient is to keep the bandage clean, dry, and do not remove.   2.  Limited walking or standing with surgical shoe on the right foot.  3.  Bactrim DS 1 p.o. q.12 h.  4.  Follow-up appointment next Wednesday.   The patient was discharged in stable and satisfactory condition.   ____________________________ Linus Galasodd Ileana Chalupa, DPM tc:jm D: 05/18/2013 13:00:19 ET T: 05/18/2013 13:14:08 ET JOB#: [id number]  cc: Linus Galasodd Clayden Withem, DPM, <Dictator> Annaleia Pence DPM ELECTRONICALLY SIGNED 05/26/2013 10:02

## 2015-01-10 NOTE — Consult Note (Signed)
Brief Consult Note: Diagnosis: 1. Right Great Toe Infection/Gangrene 2. DM 3. HTN 4. hx of A. fib.   Patient was seen by consultant.   Consult note dictated.   Recommend to proceed with surgery or procedure.   Orders entered.   Comments: 59 yo male w/ hx of DM, HTN, hx of a. fib came into hospital as a direct admission from Podiatry office due to right great toe infection/gangrene.   1. Right Great Toe infection/gangrene - cont. IV Vanco.  - going for right great toe amputation tomorrow as per POdiatry.  - cont. care as per Podiatry.   2. Pre-operative medial eval - likely a low risk for non-cardiac surgery.  - no contraindications to surgery at this time.  - cont. pre-operative B-blocker. Will review ECG.   3. DM - cont. SSI.  - hold Glipizide for now.   4. HTN - cont. Enalapril, Metoprolol  5. hx of a. fib - currently in NSR and will check ECG.   Full Code  Thanks for the consult and will follow with you.  Job # [id number].  Electronic Signatures: [place] SirenSainani, Dhana Totton J (MD)  (Signed 25-Aug-14 14:27)  Authored: Brief Consult Note   Last Updated: 25-Aug-14 14:27 by [place] SirenSainani, Tykia Mellone J (MD)

## 2015-01-10 NOTE — Consult Note (Signed)
PATIENT NAME:  Raymond Avery, Raymond Avery MR#:  [id number] DATE OF BIRTH:  1955/10/24  DATE OF CONSULTATION:  12/22/2012  REFERRING PHYSICIAN:  Dr. Allena KatzPatel CONSULTING PHYSICIAN:  Rosalyn GessMichael E. Chevez Sambrano, MD  NEW INFECTIOUS DISEASE VISIT   REASON FOR CONSULTATION:  Methicillin sensitive Staphylococcus aureus sepsis.   HISTORY OF PRESENT ILLNESS: The patient is a 59 year old male with a past history significant for poorly controlled diabetes, who was admitted on 04/01 with several-week history of generalized malaise and feeling poorly. He states that he began feeling ill in January when he had a nonspecific viral illness with generalized malaise and some nausea and vomiting. He recovered from this illness and several weeks prior to admission developed a subsequent illness with some cough, some shortness of breath and generalized malaise. He was out of work for approximately a week or so and his family ultimately brought him to the hospital. During his recent illness, he has had some diarrhea. He did take some of his son's amoxicillin. He denied any urinary symptoms to me, although the H and P indicates he had some increased frequency and burning with urination. In the Emergency Room he was noted to be tachycardic and have acute renal failure. He was admitted to the CCU. He was started on vancomycin and Zosyn. Blood cultures have grown methicillin sensitive Staphylococcus aureus. An echocardiogram did not demonstrate any valvular vegetations. He was also noted to be in atrial fibrillation. He has been doing better over the last few days.   ALLERGIES:  Include OXYCODONE.   PAST MEDICAL HISTORY: 1.  Diabetes. The patient had been on diabetes medicine until approximately September when he decided to stop.  2.  Hypertension.  3.  Status post MI.   SOCIAL HISTORY: The patient lives at home with his son. He does not smoke. He drinks several drinks per day. The last time he had anything to drink was several weeks ago;  however, no injecting drug use history.   FAMILY HISTORY: Positive for MI in his mother and colon cancer in his father.  REVIEW OF SYSTEMS:  GENERAL: Some mild fevers, chills and sweats but he said these had improved over the last week or so prior to admission.  HEENT:  No headaches. No sinus congestion. Rare sore throat. No nasal congestion.  NECK:  No stiffness. No swollen glands.  RESPIRATORY: Some nonproductive cough, minimal shortness of breath. No sputum production. No wheezing.  CARDIAC:  No chest pains or palpitations. No peripheral edema.  GASTROINTESTINAL: Some nausea. No vomiting. No abdominal pain. Positive diarrhea multiple times a day. This has now resolved.  GENITOURINARY:  He denied any complaints to me.  MUSCULOSKELETAL:  No myalgias or arthralgias. No frank arthritis.  SKIN:  No rashes.  NEUROLOGIC:  No focal weakness.  PSYCHIATRIC:  No complaints.  All other systems are negative.   PHYSICAL EXAMINATION: VITAL SIGNS:  T-max of 100.2, T-current of 98.2, pulse 94, blood pressure 166/85, 93% on room air.  GENERAL:  A 59 year old white male in no acute distress.  HEENT: Normocephalic, atraumatic. Pupils equal, reactive to light. Extraocular motion intact. Sclerae, conjunctivae, and lids are without evidence for emboli or petechiae. Oropharynx shows no erythema or exudate. Teeth and gums are in fair condition.  NECK:  Supple. Full range of motion. Midline trachea. No lymphadenopathy. No thyromegaly.  CHEST:  Clear to auscultation bilaterally with good air movement. No focal consolidation.  CARDIAC: Irregularly irregular rhythm without murmur, rub or gallop.  ABDOMEN:  Soft, nontender and nondistended. No hepatosplenomegaly.  No hernias noted.  EXTREMITIES:  No evidence for tenosynovitis.  SKIN:  No rashes. No stigmata of endocarditis. Specifically, no Janeway lesions or Osler nodes.  NEUROLOGIC:  The patient was awake and interactive, moving all 4 extremities.  PSYCHIATRIC:   Mood and affect appeared normal.   LABORATORY DATA: BUN of 8, creatinine 0.78, bicarbonate 27, anion gap of 6. LFTs on admission showed an AST of 55 ALT 33, alkaline phosphatase of 145, total bilirubin of 0.9. TSH was 0.082 with a free T4 of 1.47. White count is 10.3 with a hemoglobin of 11.0, platelet count of 198, ANC of 8.0. On admission his white count was 27.0. Blood cultures from admission are growing methicillin sensitive Staphylococcus aureus in 4 of 4 bottles. There was gram-negative rod noted in one set that on Gram stain that was unable to be isolated. A Clostridium difficile PCR was negative. Stool cultures were negative. A urinalysis on admission had greater than 500 glucose, 2+ blood, negative nitrites, 3+ leukocyte esterase, 19 red cells, 409 white cells. Urine culture is growing 50,000 CFUs per mL of methicillin sensitive Staphylococcus aureus. Repeat blood culture from 04/02 shows no growth. An echocardiogram showed mild global LV dysfunction with an ejection fraction of 50% to 55%. Mitral valve had mild mitral annular calcifications but no stenosis. There was trace regurgitation seen. Tricuspid valve was normal with trivial tricuspid regurgitation. Aortic valve had mild sclerosis present, but no stenosis. There was no regurgitation noted. Pulmonic valve was not well seen. There is no note of any vegetations.  A chest x-ray showed no acute cardiopulmonary disease.  A CT scan of the abdomen and pelvis demonstrated severe bladder wall thickening and perivesicular inflammatory changes concerning for cystitis. There was a nodular soft tissue area adjacent to the right bladder base that could have been an enlarged seminal vesicle versus a prostatic mass   IMPRESSION:  A 59 year old male with a past history of poorly controlled diabetes, who was admitted with methicillin sensitive Staphylococcus aureus and gram-negative rod bacteremia and atrial fibrillation.   RECOMMENDATIONS: 1.  His urine  had a small amount of MSSA on culture. Staph does not usually cause an ascending urinary tract infection. It is much more likely to be hematogenously spread to the bladder. He has not had any recent lines or surgeries. There is no obvious source for his bacteremia. He does have a possible prostatic mass.  2.  His repeat blood cultures show clearance.  3.  He had a transthoracic echo, which did not show any vegetations. He will need a TEE.  4.  We will change his antibiotics to ceftriaxone.  5.  The gram-negative rod seen on his Gram stain did not grow on culture. Ceftriaxone will cover many gram-negative rods. 6.  Would ask urology to see him about the possibility of a prostatic mass.  This is a high-level infectious disease consult. Thank you much for involving me in the patient's care.    ____________________________ Rosalyn Gess. Tanya Marvin, MD meb:ce D: 12/22/2012 14:45:29 ET T: 12/22/2012 15:32:24 ET JOB#: [id number]  cc: Rosalyn Gess. Kelina Beauchamp, MD, <Dictator> Seymore Brodowski E Holli Rengel MD ELECTRONICALLY SIGNED 12/23/2012 10:29

## 2015-01-10 NOTE — Discharge Summary (Signed)
PATIENT NAME:  Raymond Avery, Raymond Avery MR#:  [id number] DATE OF BIRTH:  03-04-1956  DATE OF ADMISSION:  12/19/2012 DATE OF DISCHARGE:  12/26/2012  DISCHARGE DIAGNOSES:  Methicillin-sensitive Staphylococcus aureus bacteremia, urinary tract infection, systemic inflammatory response syndrome due to bacteremia, diabetes mellitus, atrial fibrillation, hypertension, acute renal failure, improved hypomagnesemia, hypertrophic obstructive cardiomyopathy, pelvic mass.   CODE STATUS: FULL CODE.   MEDICATIONS ON DISCHARGE:  Enalapril 20 mg oral tablet once a day, amiodarone 400 mg oral tablet once a day, rivaroxaban 20 mg oral tablet once a day, glipizide 5 mg oral tablet 2 times a day, insulin detemir 100 units/mL take 30 units subcutaneous once a day, metoprolol tartrate 25 mg oral tablet 2 times a day, diltiazem 300 mg 24-hour capsule extended release once a day, ceftriaxone 2 grams IV every 24 hours for 8 days until April 16th, 2014, hydrochlorothiazide 25 mg oral tablet once a day.   HOME HEALTH ON DISCHARGE:  Yes.  HOME HEALTH SERVICES:  Nurse Health instructions IV antibiotic therapy and PICC line care as protocol.   DIET ON DISCHARGE:  Low sodium, low fat, cholesterol, carbohydrate-controlled diet, regular consistency.  TIMEFRAME TO FOLLOWUP:  Within 1 to 2 weeks with Dr. Leavy CellaBlocker, and Dr. Windell HummingbirdGollan's office, in 2 to 4 weeks with Dr. Heywood FootmanStoioff's office. Follow up with Dr. Leavy CellaBlocker for infection and antibiotics follow up. Followup with Dr. Mariah MillingGollan for atrial fibrillation and anticoagulation and amiodarone doses and follow up with Urologic Clinic with Dr. Lonna CobbStoioff for bladder thickening and mass.   HISTORY OF PRESENTING ILLNESS:  This is a 59 year old male who presented to the hospital with a few days history of diarrhea, frequent dysuria, also had upper respiratory illness, took some amoxicillin, which was prescribed for his son, took about 10 tablets in the last few days and started having diarrhea, has to go to the  bathroom about 10 to 15 times per day. Also had increased frequency with the urination and some burning with urination, did not have any improvement so went to [facility] and he was referred from there to ER for further evaluation. In the ER, he was having significant tachycardia with heart rate 120 to 130, noted to have acute renal failure and hyponatremia and also had a positive urinalysis with urinary tract infection noted to be having SIRS.  HOSPITAL COURSE AND STAY:  SIRS due to UTI. His urine was growing 50,000 colony-forming units plus blood culture was growing MSSA. Initially he was on broad-spectrum, but Infectious Disease consult was done, and after cultures came out positive with this, he was started on ceftriaxone 2 grams daily and continued on that with advice from ID to follow and continue doing this for the next 2 more weeks. He was discharged with PICC line and advised to follow up with Dr. Leavy CellaBlocker for further workup. His blood culture repeat one came out negative.   OTHER MEDICAL ISSUES ADDRESSED DURING THE HOSPITAL STAY:  1.  Systemic inflammatory response syndrome due to MSSA bacteremia, resolved after treatment.  2.  Acute on chronic diarrhea. C. difficile and bacterial cultures were negative and his diarrhea resolved. Possibly that might be due to antibiotic use what he was doing over-the-counter from his son's prescription.  3.  Diabetes mellitus. Which was very poorly controlled. His Hb A1c was 11, started on insulin and discharged on the same.  4.  Hypertension. Blood pressure remained stable while in the hospital.  5.  Atrial fibrillation with rapid ventricular rhythm. He was started on amiodarone drip  for better control and diltiazem was started and later on with medication it came under control. Cardiology consult was also done for this. They helped in management. Echocardiogram was done, which showed some HOCM and medication doses were adjusted by Dr. Mariah Milling. Later on  because of his MSSA bacteremia, a transesophageal echocardiogram was also done by Dr. Mariah Milling which does not show any vegetation from the walls and so he was advised to have IV antibiotic therapy for 2 weeks after having negative blood culture by Dr. Leavy Cella. 6.  Hypertension. Blood pressure was initially elevated, but metoprolol, enalapril and hydrochlorothiazide brought it under control.  7.  Acute renal failure. This was due to tubular necrosis secondary to hypotension and SIRS, improved slowly, and resolved with IV fluids.  8.  Hyponatremia. This was due to hypovolemia secondary to diarrhea and dehydration. Improved with IV fluids.  9.  Suppressed TSH and slightly elevated Free T4 likely due to euthyroid sick syndrome. We repeated it later on and TSH and free T4 were normal.  10.  Bladder thickening and pelvic mass on CT of the abdomen. Urology consult was called in and they suggested to followup as outpatient in Urology Clinic. No further inpatient workup. CONSULTATIONS DURING THE HOSPITAL:  Dr. Mariah Milling for Cardiology. Dr. Irineo Axon for Urology, Dr. Orson Aloe for Infectious Disease.  IMPORTANT LABORATORY RESULTS IN THE HOSPITAL:  On presentation, creatinine was 1.45. BNP was 2717, creatinine became normal and remained around 0.7, 0.8 range until discharge. On presentation sodium was also 121, which came up to normal and remained stable. On discharge it was 138. Hemoglobin A1c was 11.6. The patient was also treated for hypomagnesemia with the magnesium level was low in the hospital and it came up after replacement. Next, total WBC count was 27,000 on presentation, which came down to 11,000 on discharge. Hemoglobin level 11.7 on presentation and remained fairly stable. C. difficile in stool was negative. Blood culture:  MSSA bacteremia. Urine culture:  50,000 colony-forming units of staph aureus. This was reported on April 1st. Repeated blood culture on April 2nd remained no growth. Urinalysis  positive with 409 WBCs and 3+ leukocyte esterase.   Echocardiogram:  Severe, asymmetric left ventricular hypertrophy, moderately dilated left atrium, mild mitral annular calcification, low normal global left ventricular systolic function, left ventricular ejection fraction by visual estimate is 50% to 55%, mild aortic valve sclerosis without stenosis.  Transesophageal echocardiogram:  No valvular vegetation nor concerning endocarditis. No thrombus. Left ventricular ejection fraction by visual estimate is 55% to 60%. Normal right ventricular size and systolic function. Mild aortic valve sclerosis without stenosis.   TOTAL TIME SPENT ON THIS DISCHARGE:  45 minutes.   ____________________________ Hope Pigeon Elisabeth Pigeon, MD [id number] D: 12/29/2012 08:51:35 ET T: 12/29/2012 10:19:48 ET JOB#: [id number]  cc: Hope Pigeon. Elisabeth Pigeon, MD, <Dictator> Antonieta Iba, MD Verna Czech. Lonna Cobb, MD Rosalyn Gess. Blocker, MD  Altamese Dilling MD ELECTRONICALLY SIGNED 12/31/2012 22:20

## 2015-01-10 NOTE — H&P (Signed)
PATIENT NAME:  Raymond Avery, GEIMAN MR#:  [id number] DATE OF BIRTH:  23-Apr-1956  DATE OF ADMISSION:  12/19/2012  PRIMARY CARE PHYSICIAN: Does not have one.   CHIEF COMPLAINT: Diarrhea, dysuria and frequency of urination.   HISTORY OF PRESENT ILLNESS: This is a 59 year old male who presents to the hospital with a few day history of diarrhea and also frequency and dysuria. The patient says about a few weeks ago he developed an upper respiratory illness, which has now resolved. He took some of his son's amoxicillin. He took about 10 tablets. For the past few days he has been having some diarrhea, going to the bathroom about 10 to 15 times per day. He also has been having some frequency with urination, also some burning on urination. Since he was not improving, he went to the urgent care here near the hospital. He was referred by the urgent care to come to the ER for further evaluation. In the Emergency Room, the patient at triage was noted to be significantly tachycardic with heart rates in the 120s to 130s. Also noted to have acute renal failure and hyponatremia and also a positive urinalysis for urinary tract infection. The patient was noted to be in systemic inflammatory response syndrome, likely suspected to be due to urinary tract infection. The patient's diarrhea has been not related to any food. It has been now kind of dark in color, nonbloody. He has had no nausea, no vomiting, no fevers, no chills, no sick contacts and no other associated symptoms presently.   REVIEW OF SYSTEMS:    CONSTITUTIONAL: No documented fever. No weight gain or weight loss.  EYES: No blurred or double vision.  ENT: No tinnitus. No postnasal drip, no redness of the oropharynx.  RESPIRATORY: No cough, no wheeze, hemoptysis, no dyspnea.  CARDIOVASCULAR: No chest pain, no orthopnea, no palpitations, no syncope.  GASTROINTESTINAL: No nausea, no vomiting. Positive diarrhea. No abdominal pain, no melena, no hematochezia.   GENITOURINARY: Positive dysuria. No hematuria. Positive frequency.  ENDOCRINE: No polyuria, nocturia. No heat or cold intolerance.  HEMATOLOGIC: No anemia, no bruising, no bleeding.  INTEGUMENTARY: No rashes, no lesions.  MUSCULOSKELETAL: No arthritis, no swelling, no gout.  NEUROLOGIC: No numbness, no tingling, no ataxia, no seizure type activity.  PSYCHIATRIC: No anxiety, no insomnia, no ADD.   PAST MEDICAL HISTORY: Consistent with diabetes and hypertension.   ALLERGIES: No known drug allergies.   SOCIAL HISTORY: No smoking. He does drink about a couple of beers and mixed drinks every day, last drink a few weeks back. No illicit drug abuse. Lives at home with his son.   FAMILY HISTORY: Mother died from MI. Father died from colon cancer.   CURRENT MEDICATIONS: Glipizide 10 mg daily, enalapril unknown dose but daily and metoprolol 1 tab daily.   PHYSICAL EXAMINATION:  VITAL SIGNS: On admission noted to be temperature 98, pulse 107, respirations 20, blood pressure 143/90, sats 97% on room air.  GENERAL: He is a pleasant appearing male, in mild distress.  HEENT: Atraumatic, normocephalic. Extraocular muscles are intact. Pupils equal and reactive to light. Sclerae anicteric. No conjunctival injection. No pharyngeal erythema.  NECK: Supple. There is no jugular venous distention, no bruits, no lymphadenopathy, no thyromegaly.  HEART: Tachycardic, regular. No murmurs, no rubs, no clicks.  LUNGS: Clear to auscultation bilaterally. No rales, no rhonchi, no wheezes.  ABDOMEN: Soft, flat, nontender, nondistended. Has good bowel sounds. No hepatosplenomegaly appreciated.  EXTREMITIES: No evidence of any cyanosis, clubbing or peripheral edema. Has +2 pedal  and radial pulses bilaterally.  NEUROLOGICAL: The patient is alert, awake, oriented x 3 with no focal motor or sensory deficits appreciated bilaterally.  SKIN: Moist and warm with no rash appreciated.  LYMPHATIC: There is no cervical or axillary  lymphadenopathy.   LABORATORY AND RADIOLOGICAL DATA: Serum glucose of 399, BUN 20, creatinine 1.4, sodium 121, potassium 4, chloride 87, bicarbonate 23, AST 65, alkaline phosphatase 145, albumin 2.1. White cell count 27, hemoglobin 11.7, hematocrit 34.4, platelet count 223. Urinalysis showed 3+ leukocyte esterase, 19 red cells and 409 white cells. The patient did have a chest x-ray done, which showed no evidence of any acute cardiopulmonary disease. The patient also had a CT of the abdomen and pelvis done without contrast, which showed severe bladder wall thickening and perivesicular inflammatory changes consistent with cystitis.   ASSESSMENT AND PLAN: This is a 59 year old male with a history of diabetes, hypertension, who presents to the hospital due to diarrhea, frequency of urination, dysuria, and noted to be in systemic inflammatory response syndrome secondary to urinary tract infection.  1.  Systemic inflammatory response syndrome: The patient presented with tachycardia, hypotension from the urgent care; therefore, this is the likely diagnosis. Noted to have a positive urinalysis. Also has underlying diarrhea, but the source of diarrhea is unclear. I will treat the patient with intravenous fluids, give intravenous ceftriaxone for the urinary tract infection. Follow urine and blood cultures. Also check stool for Clostridium difficile and comprehensive culture. Continue supportive care for now and follow hemodynamics.  2.  Urinary tract infection: Likely the source of the systemic inflammatory response syndrome. Treat with IV ceftriaxone. Follow urine cultures. Give him some Pyridium for his dysuria.  3.  Acute diarrhea: The exact etiology of the diarrhea is unclear. He did say that he took some amoxicillin for his upper respiratory infection, therefore, questionable if this is related to Clostridium difficile. His CT abdomen did not show any evidence of intra-abdominal pathology, although it was done  noncontrast. For now, will continue supportive care with intravenous fluids and antidiarrheals. Check stool for Clostridium difficile and comprehensive culture. Place him on a clear liquid diet for now.  4.  Diabetes: The patient is usually very noncompliant with his medications and his sugars are fairly uncontrolled. For now, I will place him on sliding scale insulin coverage. Check a hemoglobin A1c.  5.  Hypertension: The patient's blood pressure is currently on the lower side; therefore, I will hold his metoprolol and enalapril for now.  6.  Acute renal failure: This is likely acute tubular necrosis from hypotension and dehydration from diarrhea. Will continue aggressive intravenous fluids. Follow BUN, creatinine and urine output. Renal dose medications. Avoid nephrotoxins. Hold his angiotensin-converting enzyme inhibitor for now.  7.  Hyponatremia: This is likely hypovolemic hypotonic hyponatremia secondary to diarrhea and dehydration. Will hydrate him with intravenous fluids and follow his sodium. 8.  Leukocytosis: This is likely secondary to the urinary tract infection and also to acute diarrhea. Will follow his white cell count after treatment with intravenous antibiotics.   CODE STATUS: The patient is a full code.   TIME SPENT WITH ADMISSION: 50 minutes.  ____________________________ Rolly PancakeVivek J. Cherlynn KaiserSainani, MD [email or web address] D: 12/19/2012 18:30:03 ET T: 12/19/2012 19:00:34 ET JOB#: [id number]  cc: Rolly PancakeVivek J. Cherlynn KaiserSainani, MD, <Dictator> Houston SirenVIVEK J Keliah Harned MD ELECTRONICALLY SIGNED 12/20/2012 21:19

## 2015-01-10 NOTE — Consult Note (Signed)
  DATE OF BIRTH:  07-Jan-1956  DATE OF CONSULTATION:  12/22/2012  REFERRING PHYSICIAN:  Dr. Leavy CellaBlocker  CONSULTING PHYSICIAN:  Scott C. Stoioff, MD  REASON FOR CONSULTATION:  Possible prostatic mass.   HISTORY OF PRESENT ILLNESS:  A 59 year old white male admitted April 1 with a several- week history of malaise, and a several-day history of  cough and shortness of breath.. He was subsequently found to have methicillin-sensitive Staphylococcus aureus sepsis. A urine culture did grow 50,000 colonies of methicillin-resistant Staph aureus. He has noted a one-week history of urinary frequency and dysuria. He is presently on peridium, and states these symptoms are significantly improved. He has been seen by Infectious Disease, and no obvious etiology of his sepsis has been identified. It is felt his positive urine culture is secondary to hematogenous spread.   Past urologic history is remarkable for erectile dysfunction. He has previously seen Dr. Leonette MonarchHarman. Prior to his admission, he has had no bothersome lower urinary tract symptoms, including hesitancy, straining to urinate, decreased force caliber stream, urinary incontinence, frequency, urgency or nocturia. He denies gross hematuria.   PAST MEDICAL HISTORY: 1.  Diabetes mellitus.  2.  Hypertension.  3.  Coronary artery disease.   SOCIAL HISTORY: No tobacco use. He has several drinks per week.   ALLERGIES:  NKDA.  REVIEW OF SYSTEMS: Otherwise noncontributory, except as per the HPI.   PHYSICAL EXAMINATION:  VITAL SIGNS:  Temp is 95.9, pulse 76, BP 149/78.  GENERAL:  Alert male in no acute distress.  HEENT:  Conjunctivae/sclerae clear.  NECK:  Supple, without adenopathy.  ABDOMEN:  Soft, nontender, without masses.  GENITOURINARY: Examination was deferred at this time.   DIAGNOSTIC DATA:  Noncontrast CT of the abdomen and pelvis was reviewed. There is no hydronephrosis. The bladder wall is thickened. There is an approximately 3 x 4 cm soft  tissue density adjacent to the right bladder base. It is difficult to ascertain if this is asymmetric prostate or seminal vesicle.    IMPRESSIONS:  1.  Small pelvic mass of undetermined etiology.  2.  Benign prostatic hypertrophy.  3.  Frequency and dysuria secondary to urinary tract infection - improving.   RECOMMENDATION:  I have recommended a followup appointment in approximately one month for urine recheck, DRE. He will subsequently need either a contrast CT of pelvis or MRI. This was discussed with the patient, and he indicates he will follow up in approximately one month. If he is discharged over the weekend, he will contact our office for followup.   ____________________________ Verna CzechScott C. Lonna CobbStoioff, MD scs:mr D: 12/22/2012 18:39:21 ET T: 12/22/2012 21:33:08 ET JOB#: [id number]  cc: Lorin PicketScott C. Lonna CobbStoioff, MD, <Dictator> Riki AltesSCOTT C STOIOFF MD ELECTRONICALLY SIGNED 12/28/2012 8:06

## 2015-01-10 NOTE — Consult Note (Signed)
Impression:    59yo male w/ h/o poorly controlled DM admitted with Methacillin Sensitive Staph aureus and GNR bacteremia sepsis and atrial fibrillation.     His urine had a small amount of Methacillin Sensitive Staph aureus in his urine.  Staph does not usually cause ascending UTI.  It is much more likely to be hematogenous spread to the bladder.  He has not had any recent lines or surgeries.  No obvious source for his bacteremia.  He does have a possible prostatic mass.    His repeat BCx show clearance.    He had a TTE which did not note any vegetations.  He will need a TEE.    Will change his antibiotics to ceftriaxone.    The GNR was seen on Gm stain, but did not grow on culture.  Ceftriaxone will cover many GNRs. 6) Would ask urology to see him for possible prostatic mass.  Electronic Signatures: Ashanna Heinsohn MPH, Rosalyn GessMichael E (MD) (Signed on 04-Apr-14 14:32)  Authored   Last Updated: 04-Apr-14 14:45 by Briona Korpela MPH, Rosalyn GessMichael E (MD)

## 2015-01-10 NOTE — H&P (Signed)
PATIENT NAME:  Raymond Avery, Raymond Avery MR#:  [id number] DATE OF BIRTH:  19-Feb-1956  DATE OF ADMISSION:  05/14/2013  HISTORY OF PRESENT ILLNESS: This is a 59 year old poorly controlled diabetic male with a chronic history of an ulceration on his right great toe. The patient states that within the last month or so, started to have more problems with the ulceration and it became infected a couple of weeks ago. Started taking some amoxicillin. He states that in the last couple of weeks it has significantly degraded and the tip has turned black and the infection has seemed to spread all the way down to the bone, which is now exposed. The patient states he has not been followed by a regular medical doctor for his diabetes and states he most likely is not in very good control. Has had a history of noncompliance with medical issues and medication in the past. The patient presented to the office and was admitted for IV antibiotics and amputation of the great toe.   PAST MEDICAL HISTORY:  Type 2 diabetes with associated neuropathy, hypertension, obesity, A. fib, noncompliance with medical recommendations, sleep apnea.   PAST SURGICAL HISTORY:  Right foot surgery 2008.   MEDICATIONS:  Hydrochlorothiazide 25 mg 2 daily, glipizide 5 mg 1 tablet p.o. b.i.d., enalapril maleate 20 mg daily, metoprolol 25 mg b.i.d., amoxicillin dose unsure.   ALLERGIES: No known drug allergies.   FAMILY HISTORY: Hypertension.   SOCIAL HISTORY: Does relate some alcohol use. Does not relate tobacco. He does work but it is only to about 20 hours a week at this point.   REVIEW OF SYSTEMS: The patient denies any fever or chills. He has had some swelling in the right leg and foot. Significant redness and drainage with odor from the right great toe. Does relate significant neuropathy symptoms distally in the toes on both feet. Denies any stomach pain or nausea. No cough or shortness of breath. Denies nasal discharge or stuffiness. No hearing or  vision changes. Denies any generalized weakness or fatigue. No weight loss or gain.   PHYSICAL EXAMINATION VASCULAR: DP pulse is 2/4 bilateral. PT pulse is trace bilateral. Capillary filling time is intact with the exception of the right great toe which is gangrenous.  NEUROLOGICAL: There is loss of protective sensation distally in the toes bilateral. Proprioception is impaired.  INTEGUMENT: The skin is warm, dry and supple. There is some edema in the right foot and ankle as well as significant erythema in the forefoot and at the base of the great toe. Full-thickness ulceration with underlying necrotic tissue and exposed bone at the hallux IPJ with gangrenous changes distally at the tip.  MUSCULOSKELETAL: Adequate range of motion of the pedal joints.   IMPRESSION 1.  Diabetes with neuropathy, poorly controlled.  2.  Gangrene with osteomyelitis, right great toe.  3.  Cellulitis, foot.   PLAN:  The patient is admitted for IV antibiotics and amputation of the right great toe. I discussed the procedure with the patient as well as expected healing times. Risks and complications of surgery were discussed. We will obtain a consent form for amputation of the right great toe. Consult internal medicine for medical history and physical and daily medical management as well as surgical clearance. The patient initiated on vancomycin, to be dose adjusted by pharmacy. He will be n.p.o. after midnight. Plan for surgery tomorrow around noon.    ____________________________ Linus Galas, DPM tc:cs D: 05/14/2013 13:54:32 ET T: 05/14/2013 14:10:23 ET JOB#: [id number]  cc: Tawanna Cooler  Alberteen Spindleline, DPM, <Dictator> Jerami Tammen DPM ELECTRONICALLY SIGNED 05/26/2013 10:01

## 2015-01-10 NOTE — Consult Note (Signed)
General Aspect 59 year old male who presents to the hospital with a few day history of diarrhea, dysuria, converting to atrial fib with RVR today.    A few weeks ago he developed an upper respiratory illness, and took  his son's amoxicillin, about 10 tablets. For the past few days he has been having some diarrhea, going to the bathroom about 10 to 15 times per day. He describes it as "very dark tar like stool".  He has been having some frequency with urination, and burning on urination. Since he was not improving, he went to the urgent care.  referred by the urgent care to come to the ER for further evaluation. In the Emergency Room, the patient  was noted to be significantly tachycardic with heart rates in the 120s to 130s. Also noted to have acute renal failure, hyponatremia, positive urinalysis for urinary tract infection. . He has had no nausea, no vomiting, no fevers, no chills, no sick contacts and no other associated symptoms presently.   Present Illness . ALLERGIES: No known drug allergies.   SOCIAL HISTORY: No smoking. He does drink about a couple of beers and mixed drinks every day, last drink a few weeks back. No illicit drug abuse. Lives at home with his son.   FAMILY HISTORY: Mother died from MI. Father died from colon cancer.   Physical Exam:  GEN well developed, well nourished, no acute distress   HEENT pink conjunctivae   NECK supple   RESP normal resp effort  clear BS   CARD Irregular rate and rhythm  Tachycardic   ABD denies tenderness  soft   LYMPH negative neck   SKIN normal to palpation   NEURO motor/sensory function intact   PSYCH alert, A+O to time, place, person, good insight   Review of Systems:  Subjective/Chief Complaint diarrhea, palpitations   General: Weight loss or gain  Fatigue  Weakness  Trouble sleeping   Skin: No Complaints   ENT: No Complaints   Eyes: No Complaints   Neck: No Complaints   Respiratory: No Complaints    Cardiovascular: Palpitations   Gastrointestinal: No Complaints   Genitourinary: No Complaints   Vascular: No Complaints   Musculoskeletal: No Complaints   Neurologic: No Complaints   Hematologic: No Complaints   Endocrine: No Complaints   Psychiatric: No Complaints   Review of Systems: All other systems were reviewed and found to be negative   Medications/Allergies Reviewed Medications/Allergies reviewed     Urinary Tract Infection: 19-Dec-2012   Hyponatremia: 19-Dec-2012   Diarrhea: 19-Dec-2012   Acute Renal Failure: 19-Dec-2012   CHF:    HTN:        Admit Diagnosis:   ACUTE RENAL FAILURE: Onset Date: 20-Dec-2012, Status: Active, Description: ACUTE RENAL FAILURE      Admit Reason:   Diarrhea (787.91): Status: Active, Coding System: ICD9, Coded Name: Diarrhea  Home Medications: Medication Instructions Status  metoprolol 1  orally once a day Active  enalapril 1  orally once a day Active  glipiZIDE 1  orally once a day Active   Lab Results:  Hepatic:  01-Apr-14 11:45   Bilirubin, Total 0.9  Alkaline Phosphatase  145  SGPT (ALT) 33  SGOT (AST)  55  Total Protein, Serum 7.2  Albumin, Serum  2.1  Routine Chem:  01-Apr-14 11:45   Glucose, Serum  399  BUN  20  Creatinine (comp)  1.45  Sodium, Serum  121  Potassium, Serum 4.0  Chloride, Serum  87  CO2, Serum  23  Calcium (Total), Serum  8.2  Anion Gap 11  Osmolality (calc) 263  eGFR (African American) >60  eGFR (Non-African American)  53 (eGFR values <29m/min/1.73 m2 may be an indication of chronic kidney disease (CKD). Calculated eGFR is useful in patients with stable renal function. The eGFR calculation will not be reliable in acutely ill patients when serum creatinine is changing rapidly. It is not useful in  patients on dialysis. The eGFR calculation may not be applicable to patients at the low and high extremes of body sizes, pregnant women, and vegetarians.)  Hemoglobin A1c ([facility])  11.6 (The  American Diabetes Association recommends that a primary goal of therapy should be <7% and that physicians should reevaluate the treatment regimen in patients with HbA1c values consistently >8%.)  B-Type Natriuretic Peptide ([facility]  2717 (Result(s) reported on 19 Dec 2012 at 01:34PM.)  Lipase  66 (Result(s) reported on 19 Dec 2012 at 12:29PM.)  Cardiac:  01-Apr-14 11:45   CK, Total 89  CPK-MB, Serum 0.5 (Result(s) reported on 19 Dec 2012 at 01:34PM.)  Troponin I < 0.02 (0.00-0.05 0.05 ng/mL or less: NEGATIVE  Repeat testing in 3-6 hrs  if clinically indicated. >0.05 ng/mL: POTENTIAL  MYOCARDIAL INJURY. Repeat  testing in 3-6 hrs if  clinically indicated. NOTE: An increase or decrease  of 30% or more on serial  testing suggests a  clinically important change)  Routine Hem:  01-Apr-14 11:45   WBC (CBC)  27.0  RBC (CBC)  4.04  Hemoglobin (CBC)  11.7  Hematocrit (CBC)  34.4  Platelet Count (CBC) 223 (Result(s) reported on 19 Dec 2012 at 12:04PM.)  MCV 85  MCH 29.0  MCHC 34.1  RDW 14.3    Oxycodone: N/V  Vital Signs/Nurse's Notes: **Vital Signs.:   02-Apr-14 18:26  Vital Signs Type Routine  Temperature Temperature (F) 99.5  Celsius 37.5  Temperature Source oral  Pulse Pulse 144  Respirations Respirations 36  Systolic BP Systolic BP 1229 Diastolic BP (mmHg) Diastolic BP (mmHg) 78  Mean BP 98  Pulse Ox % Pulse Ox % 94  Pulse Ox Activity Level  At rest  Oxygen Delivery Room Air/ 21 %  Pulse Ox Heart Rate 110  Telemetry pattern Cardiac Rhythm Atrial fibrillation; rate151    Impression 59year old male who presents to the hospital with a few day history of diarrhea, dysuria, converting to atrial fib with RVR today.    1) Atrial fibrillation with RVR rate is around 140 this evening, mild sx Poor rate control on diltiazem IV and digoxin Likely converted secondary to recent diarrhea/SIRS, low potassium and magnesium --Will replete magnesium, potassium (additional kcl  40) --Will start amiodarone infusion/bolus, change diltaizem to 90 mg po q6 hrs given extra digoxin 0.25 now  2) Diarrhea stool studies pending,  3.  Urinary tract infection:  on ABX, cx pending  4.  Diabetes:  poorly controlled, hgba1c is 11, incrase levamir  5.  Hypertension:   will monitor on diltiazem  6.  Acute renal failure: Suspect dehydration and acute tubular necrosis  7.  Hyponatremia: Likely secondary to secondary to diarrhea and dehydration. improved with ivf  8.  Leukocytosis:  This is likely secondary to the urinary tract infection and also to acute diarrhea. management per medical service   Electronic Signatures: GIda Rogue(MD)  (Signed 02-Apr-14 22:08)  Authored: General Aspect/Present Illness, History and Physical Exam, Review of System, Past Medical History, Health Issues, Home Medications, Labs, Allergies, Vital Signs/Nurse's Notes, Impression/Plan   Last Updated:  02-Apr-14 22:08 by Ida Rogue (MD)

## 2015-01-10 NOTE — Consult Note (Signed)
Brief Consult Note: Diagnosis: R great toe osteo and gangrene with mixed cx. S/p amp 7.26 with viable margins. Poorly controlled DM.   Patient was seen by consultant.   Consult note dictated.   Recommend further assessment or treatment.   Orders entered.   Comments: Cont vanco  Start zosyn pending ID and sensis of GNR Could likely treat wtih oral abx depending on final cx results. Would do 2-4 weeks oral therapy depeniding on how wound heals I will be off until 9/2 but please call wtih cx results and I can help with final abx selection [phone]. i will also see in 2-4 weeks as otpt. Thank you for the consult.  Electronic Signatures: Dierdre HarnessFitzgerald, Makaela Cando Patrick (MD)  (Signed 28-Aug-14 22:13)  Authored: Brief Consult Note   Last Updated: 28-Aug-14 22:13 by Dierdre HarnessFitzgerald, Mehar Sagen Patrick (MD)

## 2015-01-10 NOTE — Op Note (Signed)
PATIENT NAME:  Raymond Avery, Raymond Avery MR#:  [id number] DATE OF BIRTH:  12/28/1955  DATE OF PROCEDURE:  12/26/2012  PREOPERATIVE DIAGNOSIS:  Methicillin-resistant Staphylococcus aureus bacteremia.  POSTOPERATIVE DIAGNOSIS:  Methicillin-resistant Staphylococcus aureus bacteremia.   PROCEDURES: 1.  Ultrasound guidance for vascular access to left basilic vein.  2.  Fluoroscopic guidance for placement of catheter.  3.  Insertion of a single-lumen 4-French PICC line, left arm.   SURGEON:  Renford DillsGregory G Elinda Bunten, M.D.   ANESTHESIA:  Local.   ESTIMATED BLOOD LOSS:  Minimal.   INDICATION FOR PROCEDURE:  Requiring IV antibiotics greater than 5 days.  DESCRIPTION OF PROCEDURE:  The patient's left arm was sterilely prepped and draped, and a sterile surgical field was created. The basilic vein was accessed under direct ultrasound guidance without difficulty with a micropuncture needle and permanent image was recorded. 0.018 wire was then placed into the superior vena cava. Peel-away sheath was placed over the wire. A single lumen peripherally inserted central venous catheter was then placed over the wire and the wire and peel-away sheath were removed. The catheter tip was placed into the superior vena cava and was secured at the skin at 52 cm with a sterile dressing. The catheter withdrew blood well and flushed easily with heparinized saline. The patient tolerated procedure well.  ____________________________ Renford DillsGregory G. Kenzy Campoverde, MD ggs:jm D: 12/29/2012 18:08:36 ET T: 12/30/2012 12:08:38 ET JOB#: [id number]  cc: Renford DillsGregory G. Alinah Sheard, MD, <Dictator> Renford DillsGREGORY G Viva Gallaher MD ELECTRONICALLY SIGNED 01/29/2013 13:43

## 2015-01-10 NOTE — Consult Note (Signed)
PATIENT NAME:  Raymond Avery, Raymond Avery MR#:  [id number] DATE OF BIRTH:  February 24, 1956  DATE OF CONSULTATION:  05/14/2013  REFERRING PHYSICIAN:  Linus Galasodd Cline, DPM CONSULTING PHYSICIAN:  Rolly PancakeVivek J. Cherlynn KaiserSainani, MD  PRIMARY CARE PHYSICIAN: Does not have one.   REASON FOR CONSULTATION:  Medical management and preoperative clearance.   HISTORY OF PRESENT ILLNESS: This is a 59 year old male with a past medical history of diabetes, hypertension, history of A. fib, who presented to the hospital for from Dr. Dory Larsenline's office due to a right great toe infection/gangrene. The patient said that about a month ago, he developed significant swelling in his right leg. He used Epsom salt baths which seemed to reduce the swelling down although his right great toe continued to bother him. He went to see Dr. Alberteen Spindleline, who put him on some topical antibiotics. The patient was applying the topical antibiotics although his toe was not looking any better. The patient went to Dr. Dory Larsenline's office today and was noted to have gangrene of his right great toe. He was admitted to the hospital for IV antibiotics and for possible right great toe amputation. Hospitalist services were contacted for preoperative medical evaluation and medical management.   The patient presently denies any chest pain, shortness of breath, nausea, vomiting, abdominal pain, fevers, chills, cough or any other associated symptoms presently.   REVIEW OF SYSTEMS CONSTITUTIONAL: No documented fever. No weight gain, no weight loss.  EYES: No blurred or double vision.  EARS, NOSE AND THROAT:  No tinnitus. No postnasal drip. No redness of the oropharynx.  RESPIRATORY: No cough, no wheeze, no hemoptysis, no dyspnea.  CARDIOVASCULAR: No chest pain, no orthopnea, no palpitations, no syncope.  GASTROINTESTINAL: No nausea. No vomiting. No diarrhea. No abdominal pain. No melena or hematochezia.  GENITOURINARY: No dysuria or hematuria.  ENDOCRINE: No polyuria or nocturia. No heat or cold  intolerance .  HEMATOLOGY:  No anemia, no bruising, no bleeding.  INTEGUMENTARY: No rashes. No lesions.  MUSCULOSKELETAL: No arthritis. No swelling. No gout.  NEUROLOGIC: No numbness or tingling. No ataxia. No seizure-type activity.  PSYCHIATRIC: No anxiety, no insomnia. No ADD.   PAST MEDICAL HISTORY: Consistent with diabetes, hypertension, history of atrial fibrillation.   ALLERGIES: OXYCODONE, WHICH CAUSES NAUSEA.   SOCIAL HISTORY: Used to be a smoker, quit many years ago, does drink about 3 to 4 beers daily. No illicit drug abuse. Lives at home with his son.   FAMILY HISTORY: The patient's mother died from a myocardial infarction. Father died from colon cancer.   CURRENT MEDICATIONS: Glipizide 5 mg b.i.d., hydrochlorothiazide 25 mg daily, enalapril 20 mg b.i.d., metoprolol tartrate 25 mg b.i.d.   PHYSICAL EXAMINATION: Presently is as follows VITAL SIGNS: Temperature 98.6, pulse 71, respirations 20, blood pressure 168/92, sats 96% on room air.  GENERAL: He is a pleasant-appearing male in no apparent distress.  HEAD, EYES, EARS, NOSE AND THROAT:  Exam atraumatic, normocephalic. Extraocular muscles are intact. Pupils are equal and reactive to light. Sclerae anicteric. No conjunctival injection. No pharyngeal erythema.  NECK: Supple. There is jugular venous distension. No bruits. No lymphadenopathy. No thyromegaly.  HEART: Regular rate and rhythm. No murmurs. No rubs. No clicks.  LUNGS: Clear to auscultation bilaterally. No rales, no rhonchi, no wheezes.  ABDOMEN: Soft, flat, nontender, nondistended. Has good bowel sounds. No hepatosplenomegaly appreciated.  EXTREMITIES: He does have +1 to 2 pitting edema from the knees to the ankles bilaterally. No evidence of any cyanosis or clubbing. He does have a right great toe bandage  with exposed bone and gangrene on it. He does have a foul-smelling odor to it.  SKIN: Moist and warm with no rashes.  LYMPHATIC: There is no cervical or axillary  lymphadenopathy.   LABORATORY DATA: Serum glucose 129, BUN 9, creatinine 1.15, sodium 138, potassium 4.2, chloride 103, bicarb 30. The patient did have an x-ray of the right great toe which showed  disruption and fragmentation of the distal aspect of the first proximal phalanx and the base of the first distal phalanx concerning for osteomyelitis.   ASSESSMENT AND PLAN: This is a 59 year old male with a history of diabetes, hypertension, history of atrial fibrillation, who presents to the hospital as a direct admission from podiatry office due to a right great toe infection/gangrene.  1.  Right great toe infection and gangrene. The patient's x-ray is concerning for possible underlying osteomyelitis. For now, continue the patient with IV vancomycin. The patient is going for right great toe amputation, possibly tomorrow, as per podiatry. Continue care as per podiatry for now.  2.  Preoperative medical evaluation. The patient is likely a low risk for noncardiac surgery. There is no absolute contraindication to surgery. I would continue preoperative beta blocker.  I will review his EKG.  3.  Diabetes. Continue sliding scale insulin for now. Hold his glipizide.  4.  Hypertension. Continue enalapril. Continue metoprolol.  5.  History of chronic atrial fibrillation. The patient seems to be currently in normal sinus rhythm I will review his EKG as mentioned.  6.  The patient is a full code.   Thank you so much for the consultation. We will follow along with you.   TIME SPENT: 50 minutes.   ____________________________ Rolly Pancake. Cherlynn Kaiser, MD vjs:cs D: 05/14/2013 14:26:32 ET T: 05/14/2013 14:52:54 ET JOB#: [id number]  cc: Rolly Pancake. Cherlynn Kaiser, MD, <Dictator> Houston Siren MD ELECTRONICALLY SIGNED 06/02/2013 15:09

## 2015-01-10 NOTE — Consult Note (Signed)
Brief Consult Note: Diagnosis: Small pelvic mass of undetermined etiology.   Patient was seen by consultant.   Consult note dictated.   Comments: f/u office 1 month for DRE, UA,PSA.  Will sched pelvic MRI w/contrast on f/u.  Electronic Signatures: Riki AltesStoioff, Ladarrian Asencio C (MD)  (Signed 04-Apr-14 18:43)  Authored: Brief Consult Note   Last Updated: 04-Apr-14 18:43 by Riki AltesStoioff, Deitrich Steve C (MD)

## 2015-01-11 NOTE — H&P (Signed)
PATIENT NAME:  Raymond Avery, Raymond Avery MR#:  [id number] DATE OF BIRTH:  06/05/1956  DATE OF ADMISSION:  09/25/2013  PRIMARY CARE PHYSICIAN:  None.   REFERRING ER PHYSICIAN:  Dr. Loleta Roseory Forbach   CHIEF COMPLAINT: Double vision.   HISTORY OF PRESENT ILLNESS: This is a 59 year old male with past medical history of hypertension, atrial fibrillation, diabetes, who is taking medication every day, as he states, but is not following with any doctor. For the last 10 to 15 days,  before Christmas, he started having some double vision. He said that he has to keep closed, which is right side to see everything started and so he started wearing an eye patch. He did not see any doctor or did not go to any Emergency Room and just continued like that doing his regular work, but then the problem was not going away. So he went to an ophthalmologist today and ophthalmologist after examining him found there are some abnormal movements so we told him to go to the Emergency Room immediately, and he came over here. He was suspected having a stroke. MRI of the brain and MR angiogram were done to rule out any aneurysm , but it both of them are negative, but they confirm some finding of sinusitis.  Because of his persistent symptoms and his blood pressure was severely elevated, so he is being admitted for further management of his hypertension.  REVIEW OF SYSTEMS:  CONSTITUTIONAL: Negative for fever, fatigue, weakness, pain or weight loss.  EYES: No blurring, but has double vision.  No pain or redness or discharge.  EARS, NOSE, THROAT: No tinnitus, ear pain or hearing loss.  RESPIRATORY: No cough, wheezing, hemoptysis, or shortness of breath.  CARDIOVASCULAR: No chest pain, orthopnea, edema or arrhythmia.  GASTROINTESTINAL: No nausea, vomiting, diarrhea, abdominal pain.  GENITOURINARY: No dysuria, hematuria or increased frequency.  ENDOCRINE: No heat or cold intolerance. No increased sweating.  SKIN: No acne, rashes, or lesions.   MUSCULOSKELETAL: No pain or swelling in the joints.  NEUROLOGICAL: No numbness, weakness, tremor or vertigo.   PSYCHIATRIC: No anxiety, insomnia, bipolar disorder.   PAST MEDICAL HISTORY: Hypertension, atrial fibrillation, diabetes.   SOCIAL HISTORY: He used to be a smoker, quit many years ago. Drinks about 3 to 4 beers daily. No illicit drug use. Lives at home with his son.   FAMILY HISTORY: Mother died of myocardial infarction and father died of colon cancer.   HOME MEDICATIONS: 1.  The patient states he is taking metoprolol 25 mg oral tablet once a day.  2.  Hydrochlorothiazide 25 mg once a day.  3.  Glipizide 5 mg 2 times a day.  4.  Enalapril 20 mg 1 to 2 times a day.   He is not following with any doctor but when he was admitted to the hospital, they gave  enough refills in prescriptions and he is still taking the medication and he agrees that he is taking it regularly.    PHYSICAL EXAMINATION: VITAL SIGNS: Temperature 97.9, pulse rate 77, respirations 16 blood pressure 217/102 and pulse oximetry 96 on room air.  GENERAL: The patient is fully alert and oriented to time, place, and person. Does not appear in any acute distress.  HEENT: Head and neck atraumatic. He is wearing a patch on his right eye.  Conjunctivae pink. Oral mucosa moist.  NECK: Supple. No JVD.  RESPIRATORY: Bilateral clear and equal air entry.  CARDIOVASCULAR: S1, S2 present, regular. No murmur.  ABDOMEN: Soft, nontender. Bowel sounds present. No  organomegaly.  SKIN: No rashes.  LEGS: No edema.  NEUROLOGICAL: Power 5/5. Follows commands, no gross abnormality. Cranial nerves: Right eye has defect in moving inward, so he is having right medial rectus palsy. Other eye movements were preserved.  JOINTS: No swelling or tenderness.  SKIN: No rashes.  PSYCHIATRIC: Does not appear in any acute psychiatric illness at this time.   LABORATORY, DIAGNOSTIC AND RADIOLOGIC DATA:  Glucose 158, BUN 13, creatinine 1.07, sodium  133, potassium 4.2, chloride 101, CO2 is 28, calcium 9.4. WBC 8.7, hemoglobin 17.1, platelet count 251, MCV 90.   MRI of the brain without contrast is done and it showed unremarkable head.    MR angiogram:  No aneurysm identified. 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    ASSESSMENT AND PLAN: 1.  Hypertensive urgency. The ER physician gave hydralazine and labetalol injections to the patient and his blood pressure came down to 184/something.  We will admit him to the floor with further management with oral medication of his hypertension I added amlodipine to his home medication to have a better control.  2.  Diabetes. We will keep him on insulin sliding scale coverage currently and monitor his blood sugar.  3.  Atrial fibrillation, currently in sinus rhythm. We will continue monitoring.  4.  Double vision, and right medial rectus palsy. We will call neurology consult for further work-up. MRI is negative. Most likely this is an effect of his hypertensive urgency, but will need further input from neurologist.   CODE STATUS: Full code.   TOTAL TIME SPENT ON THIS ADMISSION: 50 minutes    ____________________________ Hope Pigeon Elisabeth Pigeon, MD vgv:cc D: 09/25/2013 20:44:29 ET T: 09/25/2013 21:00:32 ET JOB#: [id number]  cc: Hope Pigeon. Elisabeth Pigeon, MD, <Dictator> Altamese Dilling MD ELECTRONICALLY SIGNED 09/26/2013 22:24

## 2015-01-11 NOTE — Discharge Summary (Signed)
PATIENT NAME:  Raymond Avery, Raymond Avery MR#:  [id number] DATE OF BIRTH:  1955/10/24  DATE OF ADMISSION:  09/25/2013 DATE OF DISCHARGE:  09/26/2013  ADMITTING DIAGNOSIS: Double vision.   DISCHARGE DIAGNOSES: 1. 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 No evidence of cerebrovascular accident or brain mass, likely due to poorly controlled hypertension.  2. Accelerated hypertension.  3. Diabetes.  4. History of atrial fibrillation.   PERTINENT LABS AND EVALUATIONS: Admitting glucose 158, BUN 13, creatinine 1.07, sodium was 133, potassium 4.2, chloride 101, CO2 is 28, calcium 9.4. WBC count 8.7, hemoglobin 17.1, platelet count was 251.   MR angiogram. No aneurysm identified. No other abnormality was noted. 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   MRI of the brain. No evidence of acute intracranial abnormality.  left maxillary sinus  fluid compatible with history of recent sinus infection.   WBC count was 8.7, hemoglobin 17.1, platelet count 251. LFTs were normal. BMP: Glucose was 158, BUN 13, creatinine 1.07, sodium 133, potassium was 4.2, chloride 101, CO2 was 28, calcium was 8.4.   HOSPITAL COURSE: Please refer to H and P done by the admitting physician. The patient is a 59 year old white male who has a history of poorly controlled hypertension, diabetes, previous history of atrial fibrillation, who presented with complaint of having double vision since 10 to 15 days. He kept his eyes closed and then subsequently went to an ophthalmologist who recommended the patient be evaluated in the ED for possible CVA or brain mass. He was noted to have cranial III palsy. He also was noted to have significantly elevated blood pressures on presentation. Due to these symptoms, he was admitted. He underwent MRI and MRA of the brain showed no acute abnormality.  His case was discussed with Dr. Frazier ButtBenfield. He states that with MRI being  negative and MRA being negative, likely due to accelerated hypertension, recommend continuing the patient with supportive care including the patch. Follow-up with them in a few weeks. Therefore, at this time, the patient is being discharged. With further outpatient ophthalmology follow-up.   DISCHARGE MEDICATIONS: Hydrochlorothiazide 25, 2 tabs daily, enalapril 20 1 tab p.o. b.i.d., glipizide 5, 1 tab p.o. b.i.d. metoprolol tartrate 25 daily, amlodipine 10 daily, amoxicillin clavulanate   mg 1 tab p.o. b.i.d. x 7 days.   DIET: Low sodium, carbohydrate -controlled.   ACTIVITY: As tolerated.   FOLLOWUP: With primary M.D. in 1 to 2 weeks. Follow [facility] with [facility] in 2 to 4 weeks. The patient to keep a blood pressure log  every day. Notify primary care M.D. if blood pressure is accelerated significantly.   TIME SPENT ON DISCHARGE: 35 minutes.  ____________________________ Lacie ScottsShreyang H. Allena KatzPatel, MD shp:sg D: 10/16/2013 08:13:19 ET T: 10/16/2013 09:19:32 ET JOB#: [id number]  cc: Jkayla Spiewak H. Allena KatzPatel, MD, <Dictator> Charise CarwinSHREYANG H Jackelynn Hosie MD ELECTRONICALLY SIGNED 10/20/2013 10:29

## 2015-06-09 ENCOUNTER — Inpatient Hospital Stay
Admission: AD | Admit: 2015-06-09 | Discharge: 2015-06-14 | DRG: 616 | Disposition: A | Discharge status: Home / Self Care | Payer: No Typology Code available for payment source | Source: Ambulatory Visit | Attending: Internal Medicine | Admitting: Internal Medicine

## 2015-06-09 ENCOUNTER — Inpatient Hospital Stay: Payer: No Typology Code available for payment source

## 2015-06-09 DIAGNOSIS — I739 Peripheral vascular disease, unspecified: Secondary | ICD-10-CM | POA: Diagnosis present

## 2015-06-09 DIAGNOSIS — Z79899 Other long term (current) drug therapy: Secondary | ICD-10-CM

## 2015-06-09 DIAGNOSIS — E1152 Type 2 diabetes mellitus with diabetic peripheral angiopathy with gangrene: Secondary | ICD-10-CM | POA: Diagnosis present

## 2015-06-09 DIAGNOSIS — N189 Chronic kidney disease, unspecified: Secondary | ICD-10-CM | POA: Diagnosis present

## 2015-06-09 DIAGNOSIS — E11621 Type 2 diabetes mellitus with foot ulcer: Principal | ICD-10-CM | POA: Diagnosis present

## 2015-06-09 DIAGNOSIS — Z7901 Long term (current) use of anticoagulants: Secondary | ICD-10-CM | POA: Diagnosis not present

## 2015-06-09 DIAGNOSIS — E11628 Type 2 diabetes mellitus with other skin complications: Secondary | ICD-10-CM | POA: Diagnosis present

## 2015-06-09 DIAGNOSIS — I129 Hypertensive chronic kidney disease with stage 1 through stage 4 chronic kidney disease, or unspecified chronic kidney disease: Secondary | ICD-10-CM | POA: Diagnosis present

## 2015-06-09 DIAGNOSIS — E860 Dehydration: Secondary | ICD-10-CM | POA: Diagnosis present

## 2015-06-09 DIAGNOSIS — E1169 Type 2 diabetes mellitus with other specified complication: Secondary | ICD-10-CM | POA: Diagnosis present

## 2015-06-09 DIAGNOSIS — M25532 Pain in left wrist: Secondary | ICD-10-CM | POA: Diagnosis present

## 2015-06-09 DIAGNOSIS — R6520 Severe sepsis without septic shock: Secondary | ICD-10-CM | POA: Diagnosis present

## 2015-06-09 DIAGNOSIS — M868X7 Other osteomyelitis, ankle and foot: Secondary | ICD-10-CM | POA: Diagnosis present

## 2015-06-09 DIAGNOSIS — Z794 Long term (current) use of insulin: Secondary | ICD-10-CM

## 2015-06-09 DIAGNOSIS — L03116 Cellulitis of left lower limb: Secondary | ICD-10-CM

## 2015-06-09 DIAGNOSIS — I4891 Unspecified atrial fibrillation: Secondary | ICD-10-CM | POA: Diagnosis present

## 2015-06-09 DIAGNOSIS — I509 Heart failure, unspecified: Secondary | ICD-10-CM | POA: Diagnosis present

## 2015-06-09 DIAGNOSIS — N179 Acute kidney failure, unspecified: Secondary | ICD-10-CM | POA: Diagnosis present

## 2015-06-09 DIAGNOSIS — E669 Obesity, unspecified: Secondary | ICD-10-CM | POA: Diagnosis present

## 2015-06-09 DIAGNOSIS — E1140 Type 2 diabetes mellitus with diabetic neuropathy, unspecified: Secondary | ICD-10-CM | POA: Diagnosis present

## 2015-06-09 DIAGNOSIS — A4181 Sepsis due to Enterococcus: Secondary | ICD-10-CM | POA: Diagnosis present

## 2015-06-09 DIAGNOSIS — M84478A Pathological fracture, left toe(s), initial encounter for fracture: Secondary | ICD-10-CM | POA: Diagnosis present

## 2015-06-09 DIAGNOSIS — R52 Pain, unspecified: Secondary | ICD-10-CM

## 2015-06-09 DIAGNOSIS — Z6839 Body mass index (BMI) 39.0-39.9, adult: Secondary | ICD-10-CM

## 2015-06-09 HISTORY — DX: Heart failure, unspecified: I50.9

## 2015-06-09 LAB — COMPREHENSIVE METABOLIC PANEL
ALBUMIN: 3.4 g/dL — AB (ref 3.5–5.0)
ALT: 27 U/L (ref 17–63)
ANION GAP: 11 (ref 5–15)
AST: 23 U/L (ref 15–41)
Alkaline Phosphatase: 66 U/L (ref 38–126)
BILIRUBIN TOTAL: 0.8 mg/dL (ref 0.3–1.2)
BUN: 32 mg/dL — ABNORMAL HIGH (ref 6–20)
CALCIUM: 8.8 mg/dL — AB (ref 8.9–10.3)
CHLORIDE: 97 mmol/L — AB (ref 101–111)
CO2: 22 mmol/L (ref 22–32)
CREATININE: 1.51 mg/dL — AB (ref 0.61–1.24)
GFR calc Af Amer: 56 mL/min — ABNORMAL LOW (ref >60)
GFR calc non Af Amer: 48 mL/min — ABNORMAL LOW (ref >60)
Glucose, Bld: 222 mg/dL — ABNORMAL HIGH (ref 65–99)
POTASSIUM: 4 mmol/L (ref 3.5–5.1)
Sodium: 130 mmol/L — ABNORMAL LOW (ref 135–145)
Total Protein: 7.9 g/dL (ref 6.5–8.1)

## 2015-06-09 LAB — CBC WITH DIFFERENTIAL/PLATELET
BASOS ABS: 0 10*3/uL (ref 0–0.1)
BASOS PCT: 0 %
EOS ABS: 0.1 10*3/uL (ref 0–0.7)
EOS PCT: 1 %
HCT: 38.5 % — ABNORMAL LOW (ref 40.0–52.0)
Hemoglobin: 12.9 g/dL — ABNORMAL LOW (ref 13.0–18.0)
Lymphocytes Relative: 7 %
Lymphs Abs: 0.9 10*3/uL — ABNORMAL LOW (ref 1.0–3.6)
MCH: 29 pg (ref 26.0–34.0)
MCHC: 33.6 g/dL (ref 32.0–36.0)
MCV: 86.4 fL (ref 80.0–100.0)
MONO ABS: 1.9 10*3/uL — AB (ref 0.2–1.0)
MONOS PCT: 15 %
Neutro Abs: 9.8 10*3/uL — ABNORMAL HIGH (ref 1.4–6.5)
Neutrophils Relative %: 77 %
PLATELETS: 290 10*3/uL (ref 150–440)
RBC: 4.46 MIL/uL (ref 4.40–5.90)
RDW: 13.3 % (ref 11.5–14.5)
WBC: 12.9 10*3/uL — ABNORMAL HIGH (ref 3.8–10.6)

## 2015-06-09 LAB — GLUCOSE, CAPILLARY
GLUCOSE-CAPILLARY: 224 mg/dL — AB (ref 65–99)
GLUCOSE-CAPILLARY: 181 mg/dL — AB (ref 65–99)

## 2015-06-09 MED ORDER — METOPROLOL TARTRATE 25 MG PO TABS: 25.0000 mg | ORAL_TABLET | Freq: Two times a day (BID) | ORAL | Status: DC

## 2015-06-09 MED ORDER — SODIUM CHLORIDE 0.9 % IV SOLN
INTRAVENOUS | Status: AC
  Administered 2015-06-09: 17:00:00 via INTRAVENOUS

## 2015-06-09 MED ORDER — AMIODARONE HCL 200 MG PO TABS: 200.0000 mg | ORAL_TABLET | Freq: Every day | ORAL | Status: DC

## 2015-06-09 MED ORDER — ACETAMINOPHEN 325 MG PO TABS
650.0000 mg | ORAL_TABLET | Freq: Four times a day (QID) | ORAL | Status: DC | PRN
  Administered 2015-06-10 – 2015-06-11 (×2): 650 mg via ORAL
  Filled 2015-06-09 (×2): qty 2

## 2015-06-09 MED ORDER — DILTIAZEM HCL ER BEADS 300 MG PO CP24: 300.0000 mg | ORAL_CAPSULE | Freq: Every day | ORAL | Status: DC

## 2015-06-09 MED ORDER — GLIPIZIDE 5 MG PO TABS
5.0000 mg | ORAL_TABLET | Freq: Two times a day (BID) | ORAL | Status: DC
  Administered 2015-06-09: 5 mg via ORAL
  Filled 2015-06-09: qty 1

## 2015-06-09 MED ORDER — VANCOMYCIN HCL 10 G IV SOLR
1500.0000 mg | Freq: Two times a day (BID) | INTRAVENOUS | Status: DC
  Filled 2015-06-09: qty 1500

## 2015-06-09 MED ORDER — VANCOMYCIN HCL 10 G IV SOLR
2500.0000 mg | Freq: Once | INTRAVENOUS | Status: DC
  Filled 2015-06-09 (×2): qty 2500

## 2015-06-09 MED ORDER — VANCOMYCIN HCL IN DEXTROSE 1-5 GM/200ML-% IV SOLN: 1000.0000 mg | INTRAVENOUS | Status: DC

## 2015-06-09 MED ORDER — INSULIN DETEMIR 100 UNIT/ML FLEXPEN: 30.0000 [IU] | Freq: Every day | SUBCUTANEOUS | Status: DC

## 2015-06-09 MED ORDER — INSULIN ASPART 100 UNIT/ML ~~LOC~~ SOLN: 0.0000 [IU] | Freq: Every day | SUBCUTANEOUS | Status: DC

## 2015-06-09 MED ORDER — SENNOSIDES-DOCUSATE SODIUM 8.6-50 MG PO TABS
1.0000 | ORAL_TABLET | Freq: Every evening | ORAL | Status: DC | PRN
  Filled 2015-06-09: qty 1

## 2015-06-09 MED ORDER — PIPERACILLIN-TAZOBACTAM 3.375 G IVPB
3.3750 g | Freq: Three times a day (TID) | INTRAVENOUS | Status: DC
Start: 2015-06-09 — End: 2015-06-14
  Administered 2015-06-09 – 2015-06-14 (×16): 3.375 g via INTRAVENOUS
  Filled 2015-06-09 (×21): qty 50

## 2015-06-09 MED ORDER — ENOXAPARIN SODIUM 40 MG/0.4ML ~~LOC~~ SOLN
40.0000 mg | SUBCUTANEOUS | Status: DC
  Filled 2015-06-09: qty 0.4

## 2015-06-09 MED ORDER — HYDRALAZINE HCL 50 MG PO TABS
100.0000 mg | ORAL_TABLET | Freq: Two times a day (BID) | ORAL | Status: DC
  Administered 2015-06-09 – 2015-06-14 (×10): 100 mg via ORAL
  Filled 2015-06-09 (×10): qty 2

## 2015-06-09 MED ORDER — METOPROLOL SUCCINATE ER 25 MG PO TB24
25.0000 mg | ORAL_TABLET | Freq: Every day | ORAL | Status: DC
  Administered 2015-06-10 – 2015-06-14 (×5): 25 mg via ORAL
  Filled 2015-06-09 (×5): qty 1

## 2015-06-09 MED ORDER — ACETAMINOPHEN 650 MG RE SUPP: 650.0000 mg | Freq: Four times a day (QID) | RECTAL | Status: DC | PRN

## 2015-06-09 MED ORDER — HYDROCHLOROTHIAZIDE 25 MG PO TABS: 50.0000 mg | ORAL_TABLET | Freq: Every day | ORAL | Status: DC

## 2015-06-09 MED ORDER — INSULIN DETEMIR 100 UNIT/ML ~~LOC~~ SOLN
30.0000 [IU] | Freq: Every day | SUBCUTANEOUS | Status: DC
  Filled 2015-06-09: qty 0.3

## 2015-06-09 MED ORDER — VANCOMYCIN HCL 10 G IV SOLR
1500.0000 mg | Freq: Two times a day (BID) | INTRAVENOUS | Status: DC
  Filled 2015-06-09 (×2): qty 1500

## 2015-06-09 MED ORDER — ENALAPRIL MALEATE 10 MG PO TABS: 20.0000 mg | ORAL_TABLET | Freq: Every day | ORAL | Status: DC

## 2015-06-09 MED ORDER — VANCOMYCIN HCL 10 G IV SOLR
1500.0000 mg | Freq: Two times a day (BID) | INTRAVENOUS | Status: DC
  Administered 2015-06-10: 1500 mg via INTRAVENOUS
  Filled 2015-06-09 (×3): qty 1500

## 2015-06-09 MED ORDER — VANCOMYCIN HCL 10 G IV SOLR
1500.0000 mg | Freq: Once | INTRAVENOUS | Status: AC
  Administered 2015-06-09: 1500 mg via INTRAVENOUS
  Filled 2015-06-09: qty 1500

## 2015-06-09 MED ORDER — PIPERACILLIN-TAZOBACTAM 3.375 G IVPB 30 MIN: 3.3750 g | Freq: Four times a day (QID) | INTRAVENOUS | Status: DC

## 2015-06-09 MED ORDER — INSULIN ASPART 100 UNIT/ML ~~LOC~~ SOLN
0.0000 [IU] | Freq: Three times a day (TID) | SUBCUTANEOUS | Status: DC
  Administered 2015-06-09: 5 [IU] via SUBCUTANEOUS
  Administered 2015-06-10: 3 [IU] via SUBCUTANEOUS
  Administered 2015-06-10 (×2): 5 [IU] via SUBCUTANEOUS
  Administered 2015-06-11: 3 [IU] via SUBCUTANEOUS
  Administered 2015-06-11 (×2): 5 [IU] via SUBCUTANEOUS
  Administered 2015-06-12: 3 [IU] via SUBCUTANEOUS
  Administered 2015-06-12: 2 [IU] via SUBCUTANEOUS
  Administered 2015-06-12: 3 [IU] via SUBCUTANEOUS
  Administered 2015-06-13: 2 [IU] via SUBCUTANEOUS
  Administered 2015-06-13: 8 [IU] via SUBCUTANEOUS
  Administered 2015-06-13: 3 [IU] via SUBCUTANEOUS
  Administered 2015-06-14: 5 [IU] via SUBCUTANEOUS
  Administered 2015-06-14 (×2): 3 [IU] via SUBCUTANEOUS
  Filled 2015-06-09: qty 3
  Filled 2015-06-09: qty 2
  Filled 2015-06-09: qty 5
  Filled 2015-06-09: qty 3
  Filled 2015-06-09: qty 5
  Filled 2015-06-09 (×2): qty 3
  Filled 2015-06-09: qty 5
  Filled 2015-06-09: qty 3
  Filled 2015-06-09: qty 8
  Filled 2015-06-09 (×2): qty 5
  Filled 2015-06-09: qty 2
  Filled 2015-06-09: qty 5
  Filled 2015-06-09 (×2): qty 3

## 2015-06-09 MED ORDER — VANCOMYCIN HCL 10 G IV SOLR
2000.0000 mg | Freq: Once | INTRAVENOUS | Status: DC
  Filled 2015-06-09: qty 2000

## 2015-06-09 NOTE — Plan of Care (Signed)
Problem: Consults Goal: General Medical Patient Education See Patient Education Module for specific education. Outcome: Progressing Pt was admitted to 1a direct admit from Dr's office, lives at home. Admitted for IV antibiotics for osteomyelitis to Left great toe.

## 2015-06-09 NOTE — Consult Note (Signed)
ANTIBIOTIC CONSULT NOTE - INITIAL  Pharmacy Consult for vancomycin/zosyn Indication: possible sepsis/osteomyleitis  No Known Allergies  Patient Measurements: Height:  (190.5 cm) Weight: (!) 315 lb (142.883 kg) IBW/kg (Calculated) : 84.5 Adjusted Body Weight: 105kg  Vital Signs:   Intake/Output from previous day:   Intake/Output from this shift:    Labs:  Recent Labs  06/09/15 1311  WBC 12.9*  HGB 12.9*  PLT 290  CREATININE 1.51*   Estimated Creatinine Clearance: 78.4 mL/min (by C-G formula based on Cr of 1.51). No results for input(s): VANCOTROUGH, VANCOPEAK, VANCORANDOM, GENTTROUGH, GENTPEAK, GENTRANDOM, TOBRATROUGH, TOBRAPEAK, TOBRARND, AMIKACINPEAK, AMIKACINTROU, AMIKACIN in the last 72 hours.   Microbiology: No results found for this or any previous visit (from the past 720 hour(s)).  Medical History: Past Medical History  Diagnosis Date  . Systemic inflammatory response syndrome   . UTI (urinary tract infection)   . Diabetes   . Hypertension   . Hyponatremia   . Leukocytosis   . Arrhythmia   . A-fib   . Acute renal failure     Medications:  Scheduled:  . enalapril  20 mg Oral Daily  . enoxaparin (LOVENOX) injection  40 mg Subcutaneous Q24H  . glipiZIDE  5 mg Oral BID AC  . hydrALAZINE  100 mg Oral BID  . insulin aspart  0-15 Units Subcutaneous TID WC  . insulin aspart  0-5 Units Subcutaneous QHS  . [START ON 06/10/2015] metoprolol succinate  25 mg Oral Daily  . piperacillin-tazobactam (ZOSYN)  IV  3.375 g Intravenous 3 times per day  . vancomycin  1,500 mg Intravenous Q12H  . vancomycin  2,500 mg Intravenous Once   Assessment: Pt is a 59 year old male direct admit from dr Clide [place] office for possible sepsis/osteo. Possible amputation of diabetic toe tomorrow Weight/ht from todays note from Dr kline= 142.9kg 6 foot 3 inch Vd= 73 T1/2=12 Ke=0.059  Goal of Therapy:  Vancomycin trough level 15-20 mcg/ml  Plan:  Measure antibiotic drug levels  at steady state Follow up culture results Load with vancomycin  once then vancomycin  q 12 hours. Zosyn 3.375g q 8 hours EI infuction  Will measure vanc trough at steady state- 21 Sept @ 1530 Pharmacy will continue to monitor renal function, adjust as needed.   Laurene Footman Maccia 06/09/2015,3:12 PM

## 2015-06-09 NOTE — Progress Notes (Signed)
ANTIBIOTIC CONSULT NOTE - INITIAL  Pharmacy Consult for Vancomycin Indication: sepsis, osteomyelitis  No Known Allergies  Patient Measurements: Height:  (190.5 cm) Weight: (!) 315 lb (142.883 kg) IBW/kg (Calculated) : 84.5 Adjusted Body Weight: 107.9 kg   Vital Signs: Temp: 99.3 F (37.4 C) (09/19 1513) Temp Source: Oral (09/19 1513) BP: 161/75 mmHg (09/19 1513) Pulse Rate: 77 (09/19 1513) Intake/Output from previous day:   Intake/Output from this shift: Total I/O In: -  Out: 200 [Urine:200]  Labs:  Recent Labs  06/09/15 1311  WBC 12.9*  HGB 12.9*  PLT 290  CREATININE 1.51*   Estimated Creatinine Clearance: 78.4 mL/min (by C-G formula based on Cr of 1.51). No results for input(s): VANCOTROUGH, VANCOPEAK, VANCORANDOM, GENTTROUGH, GENTPEAK, GENTRANDOM, TOBRATROUGH, TOBRAPEAK, TOBRARND, AMIKACINPEAK, AMIKACINTROU, AMIKACIN in the last 72 hours.   Microbiology: No results found for this or any previous visit (from the past 720 hour(s)).  Medical History: Past Medical History  Diagnosis Date  . Systemic inflammatory response syndrome   . UTI (urinary tract infection)   . Diabetes   . Hypertension   . Hyponatremia   . Leukocytosis   . Arrhythmia   . A-fib   . Acute renal failure     Medications:  Prescriptions prior to admission  Medication Sig Dispense Refill Last Dose  . amiodarone (PACERONE) 200 MG tablet Take 1 tablet (200 mg total) by mouth daily. 90 tablet 3 06/09/2015 at Unknown time  . diltiazem (TIAZAC) 300 MG 24 hr capsule Take 1 capsule (300 mg total) by mouth daily. 90 capsule 3 06/09/2015 at Unknown time  . enalapril (VASOTEC) 20 MG tablet Take 1 tablet (20 mg total) by mouth daily. 90 tablet 3 06/09/2015 at Unknown time  . glipiZIDE (GLUCOTROL) 5 MG tablet Take 1 tablet (5 mg total) by mouth 2 (two) times daily before a meal. 180 tablet 3 06/09/2015 at Unknown time  . hydrochlorothiazide (HYDRODIURIL) 25 MG tablet TAKE 2 TABLETS BY MOUTH EVERY  DAY 60 tablet 0 06/09/2015 at Unknown time  . insulin detemir (LEVEMIR) 100 unit/ml SOLN Inject 30 Units into the skin daily at 10 pm. 1 mL 6 06/09/2015 at Unknown time  . metoprolol tartrate (LOPRESSOR) 25 MG tablet Take 1 tablet (25 mg total) by mouth 2 (two) times daily. 180 tablet 3 06/09/2015 at Unknown time  . Rivaroxaban (XARELTO) 20 MG TABS Take 20 mg by mouth daily.   06/09/2015 at Unknown time   Assessment: CrCl = 78.4 ml/min Ke = 0.07 hr-1 T1/2 = 9.9 hrs Vd = 75.5 L   Goal of Therapy:  Vancomycin trough level 15-20 mcg/ml   Vancomycin 1500 mg IV X 1 to be given on 9/19 @ 21:00. Vancomycin 1500 mg IV Q12H ordered to start 9/21 @ 3:00, ~ 6 hrs after 1st dose (stacked dosing). This pt will reach Css by 9/21 @ 21:00. Will draw 1st trough on 9/21 @ 14:30, which will be very close to Css.   Plan:  Expected duration 7 days with resolution of temperature and/or normalization of WBC  Robbins,Jason D 06/09/2015,6:04 PM

## 2015-06-09 NOTE — Consult Note (Signed)
Reason for Consult: Gangrene with osteomyelitis left great toe Referring Physician: Prime doc  Raymond Avery is an 59 y.o. male.  HPI: The patient relates a history of an ulcer starting on his left great toe about 1 month ago. States he had some significant increased redness and drainage from the area couple of weeks ago. He denies any injury. About 1 week ago he did start taking some clindamycin that he was able to procure. More recently over the last week he did start to have some fever and chills and presented to the office for evaluation. Gangrene of the left great toe with apparent osteomyelitis was noted and he was sent to the hospital for admission and planning for surgical amputation of the left great toe.  Past Medical History  Diagnosis Date  . Systemic inflammatory response syndrome   . UTI (urinary tract infection)   . Diabetes   . Hypertension   . Hyponatremia   . Leukocytosis   . Arrhythmia   . A-fib   . Acute renal failure     History reviewed. No pertinent past surgical history.  Family History  Problem Relation Age of Onset  . Heart attack Mother     Social History:  reports that he has never smoked. He does not have any smokeless tobacco history on file. His alcohol and drug histories are not on file.  Allergies: No Known Allergies  Medications: I have reviewed the patient's current medications.  Results for orders placed or performed during the hospital encounter of 06/09/15 (from the past 48 hour(s))  CBC WITH DIFFERENTIAL     Status: Abnormal   Collection Time: 06/09/15  1:11 PM  Result Value Ref Range   WBC 12.9 (H) 3.8 - 10.6 K/uL   RBC 4.46 4.40 - 5.90 MIL/uL   Hemoglobin 12.9 (L) 13.0 - 18.0 g/dL   HCT 38.5 (L) 40.0 - 52.0 %   MCV 86.4 80.0 - 100.0 fL   MCH 29.0 26.0 - 34.0 pg   MCHC 33.6 32.0 - 36.0 g/dL   RDW 13.3 11.5 - 14.5 %   Platelets 290 150 - 440 K/uL   Neutrophils Relative % 77 %   Neutro Abs 9.8 (H) 1.4 - 6.5 K/uL   Lymphocytes  Relative 7 %   Lymphs Abs 0.9 (L) 1.0 - 3.6 K/uL   Monocytes Relative 15 %   Monocytes Absolute 1.9 (H) 0.2 - 1.0 K/uL   Eosinophils Relative 1 %   Eosinophils Absolute 0.1 0 - 0.7 K/uL   Basophils Relative 0 %   Basophils Absolute 0.0 0 - 0.1 K/uL  Comprehensive metabolic panel     Status: Abnormal   Collection Time: 06/09/15  1:11 PM  Result Value Ref Range   Sodium 130 (L) 135 - 145 mmol/L   Potassium 4.0 3.5 - 5.1 mmol/L   Chloride 97 (L) 101 - 111 mmol/L   CO2 22 22 - 32 mmol/L   Glucose, Bld 222 (H) 65 - 99 mg/dL   BUN 32 (H) 6 - 20 mg/dL   Creatinine, Ser 1.51 (H) 0.61 - 1.24 mg/dL   Calcium 8.8 (L) 8.9 - 10.3 mg/dL   Total Protein 7.9 6.5 - 8.1 g/dL   Albumin 3.4 (L) 3.5 - 5.0 g/dL   AST 23 15 - 41 U/L   ALT 27 17 - 63 U/L   Alkaline Phosphatase 66 38 - 126 U/L   Total Bilirubin 0.8 0.3 - 1.2 mg/dL   GFR calc non Af Amer   48 (L) >60 mL/min   GFR calc Af Amer 56 (L) >60 mL/min    Comment: (NOTE) The eGFR has been calculated using the CKD EPI equation. This calculation has not been validated in all clinical situations. eGFR's persistently <60 mL/min signify possible Chronic Kidney Disease.    Anion gap 11 5 - 15  Glucose, capillary     Status: Abnormal   Collection Time: 06/09/15  5:00 PM  Result Value Ref Range   Glucose-Capillary 224 (H) 65 - 99 mg/dL   Comment 1 Notify RN     Dg Foot Complete Left  06/09/2015   CLINICAL DATA:  Left foot cellulitis.  EXAM: LEFT FOOT - COMPLETE 3+ VIEW  COMPARISON:  None.  FINDINGS: Lytic destruction is seen involving the first distal phalanx consistent with osteomyelitis. The degenerative changes seen involving several tarsometatarsal joints. Vascular calcifications are noted.  IMPRESSION: Lytic destruction of the first distal phalanx consistent with osteomyelitis.   Electronically Signed   By: James  Green Jr, M.D.   On: 06/09/2015 14:04    Review of Systems  Constitutional: Positive for fever and chills.  HENT: Negative.    Eyes: Negative.   Respiratory: Negative.   Cardiovascular: Negative.   Gastrointestinal: Negative.   Genitourinary: Negative.   Musculoskeletal: Negative.   Skin:       Significant swelling and redness in the left leg as compared to the right. Heavy drainage from the wound on his left great toe.  Neurological:       He does relate numbness and paresthesias in his feet from his diabetic neuropathy  Endo/Heme/Allergies: Negative.   Psychiatric/Behavioral: Negative.    Blood pressure 161/75, pulse 77, temperature 99.3 F (37.4 C), temperature source Oral, resp. rate 18, height 6' 3" (1.905 m), weight 142.883 kg (315 lb), SpO2 95 %. Physical Exam  Cardiovascular:  DP and PT pulses are 2 over 4 on the right. DP pulse is barely palpable on the left and PT pulses 1 over 4 on the left.  Musculoskeletal:  Adequate range of motion of the pedal joints. On motion of the left great toe there is an obvious fracture in the distal phalanx, most likely from bone infection. Previous amputation of the right great toe is noted.  Neurological:  Loss of protective threshold monofilament wire distally in the forefoot and toes bilateral.  Skin:  The skin is warm dry and supple. Necrosis of the distal aspect of the left hallux with heavy necrotic tissue that extends directly down to exposed bone on the dorsal aspect of the toe. Significant edema and cellulitis in the entire left foot, ankle, and lower extremity. Heavy drainage from the wound on the great toe. Significant malodor is also noted.    Assessment/Plan: Assessment: 1. Gangrene with osteomyelitis left great toe. 2. Diabetes mellitus with associated neuropathy  Plan: Discussed with the patient the need for amputation of the left great toe. We will plan for this tomorrow in the early afternoon. Also discussed that he has significant calcified blood vessels which were noted on his x-ray. Due to the fact that his pulses are not as palpable on the left we  will obtain a vascular consultation after his amputation. Most likely will need circulation evaluated inpatient versus outpatient. He will be nothing by mouth after midnight tonight. Consent form for amputation of the left great toe. Plan for surgery tomorrow  CLINE,TODD W. 06/09/2015, 5:40 PM      

## 2015-06-09 NOTE — H&P (Signed)
[facility] - [place] [facility]   PATIENT NAME: Raymond Avery    MR#:  [id number]  DATE OF BIRTH:  [birth year]  DATE OF ADMISSION:  06/09/2015  PRIMARY CARE PHYSICIAN: No PCP Per Patient   REQUESTING/REFERRING PHYSICIAN: Dr. Edman Circle   CHIEF COMPLAINT:  No chief complaint on file.   HISTORY OF PRESENT ILLNESS:  Raymond Avery  is a 59 y.o. male with a known history of hypertension, diabetes, obesity comes in secondary to left to the great toe infection. The patient is a direct admit from Dr. Tawanna Cooler Cline's office concerning the spread of infection. Noted to have left great toe swelling and redness about 2 weeks ago. Symptoms got worse and associated with chills, patient redness worsened and it has spread to the below the left knee. In standing this Dr. Alberteen Spindle called and asked for diabetic admission, possible amputation of the left great toe tomorrow.  PAST MEDICAL HISTORY:   Past Medical History  Diagnosis Date  . Systemic inflammatory response syndrome   . UTI (urinary tract infection)   . Diabetes   . Hypertension   . Hyponatremia   . Leukocytosis   . Arrhythmia   . A-fib   . Acute renal failure     PAST SURGICAL HISTOIRY:  History reviewed. No pertinent past surgical history.  SOCIAL HISTORY:   Social History  Substance Use Topics  . Smoking status: Never Smoker   . Smokeless tobacco: Not on file  . Alcohol Use: Not on file    FAMILY HISTORY:   Family History  Problem Relation Age of Onset  . Heart attack Mother     DRUG ALLERGIES:  No Known Allergies  REVIEW OF SYSTEMS:  CONSTITUTIONAL: No fever, fatigue or weakness.  EYES: No blurred or double vision.  EARS, NOSE, AND THROAT: No tinnitus or ear pain.  RESPIRATORY: No cough, shortness of breath, wheezing or hemoptysis.  CARDIOVASCULAR: No chest pain, orthopnea, edema.  GASTROINTESTINAL: No nausea, vomiting, diarrhea or abdominal pain.  GENITOURINARY: No dysuria, hematuria.   ENDOCRINE: No polyuria, nocturia,  HEMATOLOGY: No anemia, easy bruising or bleeding SKIN: No rash or lesion. MUSCULOSKELETAL: Left great toe swelling ,redness, drainage. NEUROLOGIC: No tingling, numbness, weakness.  PSYCHIATRY: No anxiety or depression.   MEDICATIONS AT HOME:   Prior to Admission medications   Medication Sig Start Date End Date Taking? Authorizing Provider  amiodarone (PACERONE) 200 MG tablet Take 1 tablet (200 mg total) by mouth daily. 01/04/13  Yes Antonieta Iba, MD  diltiazem (TIAZAC) 300 MG 24 hr capsule Take 1 capsule (300 mg total) by mouth daily. 01/04/13  Yes Antonieta Iba, MD  enalapril (VASOTEC) 20 MG tablet Take 1 tablet (20 mg total) by mouth daily. 01/19/13  Yes Antonieta Iba, MD  glipiZIDE (GLUCOTROL) 5 MG tablet Take 1 tablet (5 mg total) by mouth 2 (two) times daily before a meal. 01/19/13  Yes Antonieta Iba, MD  hydrochlorothiazide (HYDRODIURIL) 25 MG tablet TAKE 2 TABLETS BY MOUTH EVERY DAY 08/16/13  Yes Antonieta Iba, MD  insulin detemir (LEVEMIR) 100 unit/ml SOLN Inject 30 Units into the skin daily at 10 pm. 01/19/13  Yes Antonieta Iba, MD  metoprolol tartrate (LOPRESSOR) 25 MG tablet Take 1 tablet (25 mg total) by mouth 2 (two) times daily. 01/19/13  Yes Antonieta Iba, MD  Rivaroxaban (XARELTO) 20 MG TABS Take 20 mg by mouth daily.   Yes Historical Provider, MD      VITAL SIGNS:  There were no vitals taken for this visit.  PHYSICAL EXAMINATION:  GENERAL:  59 y.o.-year-old patient lying in the bed with no acute distress.  EYES: Pupils equal, round, reactive to light and accommodation. No scleral icterus. Extraocular muscles intact.  HEENT: Head atraumatic, normocephalic. Oropharynx and nasopharynx clear.  NECK:  Supple, no jugular venous distention. No thyroid enlargement, no tenderness.  LUNGS: Normal breath sounds bilaterally, no wheezing, rales,rhonchi or crepitation. No use of accessory muscles of respiration.  CARDIOVASCULAR: S1,  S2 normal. No murmurs, rubs, or gallops.  ABDOMEN: Soft, nontender, nondistended. Bowel sounds present. No organomegaly or mass.  EXTREMITIES: Left lower toe swelling present and redness is involving off to the left knee area. Dressing is present to the left great toe. NEUROLOGIC: Cranial nerves II through XII are intact. Muscle strength 5/5 in all extremities. Sensation intact. Gait not checked.  PSYCHIATRIC: The patient is alert and oriented x 3.  SKIN: No obvious rash, lesion, or ulcer.   LABORATORY PANEL:   CBC  Recent Labs Lab 06/09/15 1311  WBC 12.9*  HGB 12.9*  HCT 38.5*  PLT 290   ------------------------------------------------------------------------------------------------------------------  Chemistries   Recent Labs Lab 06/09/15 1311  NA 130*  K 4.0  CL 97*  CO2 22  GLUCOSE 222*  BUN 32*  CREATININE 1.51*  CALCIUM 8.8*  AST 23  ALT 27  ALKPHOS 66  BILITOT 0.8   ------------------------------------------------------------------------------------------------------------------  Cardiac Enzymes No results for input(s): TROPONINI in the last 168 hours. ------------------------------------------------------------------------------------------------------------------  RADIOLOGY:  Dg Foot Complete Left  06/09/2015   CLINICAL DATA:  Left foot cellulitis.  EXAM: LEFT FOOT - COMPLETE 3+ VIEW  COMPARISON:  None.  FINDINGS: Lytic destruction is seen involving the first distal phalanx consistent with osteomyelitis. The degenerative changes seen involving several tarsometatarsal joints. Vascular calcifications are noted.  IMPRESSION: Lytic destruction of the first distal phalanx consistent with osteomyelitis.   Electronically Signed   By: Lupita Raider, M.D.   On: 06/09/2015 14:04    EKG:   Orders placed or performed during the hospital encounter of 06/09/15  . EKG 12-Lead  . EKG 12-Lead    IMPRESSION AND PLAN:   #1 left great toe cellulitis possible l  diabetic foot  infection of the left great toe, unable to exclude osteo-. Patient is started on IV vancomycin, Zosyn. X-ray of the left foot is ordered to evaluate for osteomyelitis. Consult podiatry  #2: Possible  Developing  sepsis with evidence of fever, elevated white count:, Acute renal failure. Continue IV hydration, follow up cultures, continue IV antibiotics. #3 acute renal failure likely ATN due to sepsis continue IV fluids. Hold the hydrochlorothiazide. #4 hypertension continue home medications except his HCTZ. #4 diabetes type 2;;continue with his glipizide which we already ordered continue SSI with coverage. Check hemoglobin A1c.   All the records are reviewed and case discussed with ED provider. Management plans discussed with the patient, family and they are in agreement.  CODE STATUS: full  TOTAL TIME TAKING CARE OF THIS PATIENT: 55 minutes.    Katha Hamming M.D on 06/09/2015 at 3:00 PM  Between 7am to 6pm - Pager - [phone]  After 6pm go to [email or web address] - password [email or web address] [facility]  [facility]  Office  [phone]  CC: Primary care physician; No PCP Per Patient

## 2015-06-09 NOTE — Plan of Care (Signed)
Problem: Discharge Progression Outcomes Goal: Discharge plan in place and appropriate Outcome: Progressing Possible surgery or partial amputation to Left great toe because of infection that turned into osteomyelitis.

## 2015-06-10 ENCOUNTER — Encounter
Admission: AD | Disposition: A | Discharge status: Home / Self Care | Payer: Self-pay | Source: Ambulatory Visit | Attending: Internal Medicine

## 2015-06-10 ENCOUNTER — Encounter: Payer: Self-pay | Admitting: *Deleted

## 2015-06-10 ENCOUNTER — Inpatient Hospital Stay: Payer: No Typology Code available for payment source | Admitting: Anesthesiology

## 2015-06-10 HISTORY — PX: AMPUTATION TOE: SHX6595

## 2015-06-10 LAB — BASIC METABOLIC PANEL
ANION GAP: 6 (ref 5–15)
BUN: 25 mg/dL — ABNORMAL HIGH (ref 6–20)
CHLORIDE: 102 mmol/L (ref 101–111)
CO2: 25 mmol/L (ref 22–32)
Calcium: 8.4 mg/dL — ABNORMAL LOW (ref 8.9–10.3)
Creatinine, Ser: 1.22 mg/dL (ref 0.61–1.24)
GFR calc Af Amer: >60 mL/min (ref >60)
Glucose, Bld: 211 mg/dL — ABNORMAL HIGH (ref 65–99)
POTASSIUM: 3.9 mmol/L (ref 3.5–5.1)
SODIUM: 133 mmol/L — AB (ref 135–145)

## 2015-06-10 LAB — GLUCOSE, CAPILLARY
GLUCOSE-CAPILLARY: 172 mg/dL — AB (ref 65–99)
GLUCOSE-CAPILLARY: 165 mg/dL — AB (ref 65–99)
GLUCOSE-CAPILLARY: 190 mg/dL — AB (ref 65–99)
Glucose-Capillary: 225 mg/dL — ABNORMAL HIGH (ref 65–99)
Glucose-Capillary: 212 mg/dL — ABNORMAL HIGH (ref 65–99)

## 2015-06-10 LAB — CBC
HEMATOCRIT: 35.4 % — AB (ref 40.0–52.0)
HEMOGLOBIN: 12.1 g/dL — AB (ref 13.0–18.0)
MCH: 29.8 pg (ref 26.0–34.0)
MCHC: 34.2 g/dL (ref 32.0–36.0)
MCV: 87.1 fL (ref 80.0–100.0)
Platelets: 239 10*3/uL (ref 150–440)
RBC: 4.07 MIL/uL — AB (ref 4.40–5.90)
RDW: 13.8 % (ref 11.5–14.5)
WBC: 11.2 10*3/uL — AB (ref 3.8–10.6)

## 2015-06-10 SURGERY — AMPUTATION, TOE
Anesthesia: General | Laterality: Left | Wound class: Dirty or Infected

## 2015-06-10 MED ORDER — ENALAPRIL MALEATE 10 MG PO TABS
10.0000 mg | ORAL_TABLET | Freq: Two times a day (BID) | ORAL | Status: DC
  Administered 2015-06-11 – 2015-06-13 (×5): 10 mg via ORAL
  Filled 2015-06-10 (×5): qty 1

## 2015-06-10 MED ORDER — ONDANSETRON HCL 4 MG/2ML IJ SOLN: 4.0000 mg | Freq: Once | INTRAMUSCULAR | Status: DC | PRN

## 2015-06-10 MED ORDER — SODIUM CHLORIDE 0.9 % IV SOLN
1250.0000 mg | Freq: Three times a day (TID) | INTRAVENOUS | Status: DC
  Administered 2015-06-10 – 2015-06-11 (×3): 1250 mg via INTRAVENOUS
  Filled 2015-06-10 (×7): qty 1250

## 2015-06-10 MED ORDER — OXYCODONE-ACETAMINOPHEN 5-325 MG PO TABS: 1.0000 | ORAL_TABLET | ORAL | Status: DC | PRN

## 2015-06-10 MED ORDER — SODIUM CHLORIDE 0.9 % IJ SOLN: 3.0000 mL | INTRAMUSCULAR | Status: DC | PRN

## 2015-06-10 MED ORDER — TRAZODONE HCL 50 MG PO TABS: 25.0000 mg | ORAL_TABLET | Freq: Every evening | ORAL | Status: DC | PRN

## 2015-06-10 MED ORDER — NEOMYCIN-POLYMYXIN B GU 40-200000 IR SOLN
Status: DC | PRN
  Administered 2015-06-10: 2 mL

## 2015-06-10 MED ORDER — PROPOFOL INFUSION 10 MG/ML OPTIME
INTRAVENOUS | Status: DC | PRN
  Administered 2015-06-10: 25 ug/kg/min via INTRAVENOUS

## 2015-06-10 MED ORDER — PROMETHAZINE HCL 25 MG PO TABS: 12.5000 mg | ORAL_TABLET | ORAL | Status: DC | PRN

## 2015-06-10 MED ORDER — MORPHINE SULFATE (PF) 4 MG/ML IV SOLN: 4.0000 mg | INTRAVENOUS | Status: DC | PRN

## 2015-06-10 MED ORDER — NEOMYCIN-POLYMYXIN B GU 40-200000 IR SOLN
Status: AC
  Filled 2015-06-10: qty 2

## 2015-06-10 MED ORDER — LIDOCAINE HCL (PF) 1 % IJ SOLN
INTRAMUSCULAR | Status: AC
  Filled 2015-06-10: qty 30

## 2015-06-10 MED ORDER — LACTATED RINGERS IV SOLN
INTRAVENOUS | Status: DC | PRN
  Administered 2015-06-10: 13:00:00 via INTRAVENOUS

## 2015-06-10 MED ORDER — FENTANYL CITRATE (PF) 100 MCG/2ML IJ SOLN: 25.0000 ug | INTRAMUSCULAR | Status: DC | PRN

## 2015-06-10 MED ORDER — SODIUM CHLORIDE 0.9 % IJ SOLN
3.0000 mL | Freq: Two times a day (BID) | INTRAMUSCULAR | Status: DC
  Administered 2015-06-10 – 2015-06-13 (×2): 3 mL via INTRAVENOUS

## 2015-06-10 MED ORDER — FENTANYL CITRATE (PF) 100 MCG/2ML IJ SOLN
INTRAMUSCULAR | Status: DC | PRN
  Administered 2015-06-10 (×2): 50 ug via INTRAVENOUS

## 2015-06-10 MED ORDER — INFLUENZA VAC SPLIT QUAD 0.5 ML IM SUSY
0.5000 mL | PREFILLED_SYRINGE | INTRAMUSCULAR | Status: DC
  Filled 2015-06-10: qty 0.5

## 2015-06-10 MED ORDER — MIDAZOLAM HCL 2 MG/2ML IJ SOLN
INTRAMUSCULAR | Status: DC | PRN
  Administered 2015-06-10: 2 mg via INTRAVENOUS

## 2015-06-10 MED ORDER — SODIUM CHLORIDE 0.9 % IV SOLN: 250.0000 mL | INTRAVENOUS | Status: DC | PRN

## 2015-06-10 MED ORDER — BUPIVACAINE HCL 0.5 % IJ SOLN
INTRAMUSCULAR | Status: DC | PRN
  Administered 2015-06-10: 10 mL

## 2015-06-10 MED ORDER — BUPIVACAINE HCL (PF) 0.5 % IJ SOLN
INTRAMUSCULAR | Status: AC
  Filled 2015-06-10: qty 30

## 2015-06-10 SURGICAL SUPPLY — 44 items
BANDAGE ELASTIC 4 CLIP ST LF (GAUZE/BANDAGES/DRESSINGS) ×2 IMPLANT
BLADE MED AGGRESSIVE (BLADE) ×2 IMPLANT
BLADE OSC/SAGITTAL MD 5.5X18 (BLADE) IMPLANT
BLADE SURG 15 STRL LF DISP TIS (BLADE) ×2 IMPLANT
BLADE SURG 15 STRL SS (BLADE) ×2
BLADE SURG MINI STRL (BLADE) ×2 IMPLANT
BNDG ESMARK 4X12 TAN STRL LF (GAUZE/BANDAGES/DRESSINGS) ×2 IMPLANT
BNDG GAUZE 4.5X4.1 6PLY STRL (MISCELLANEOUS) ×2 IMPLANT
CANISTER SUCT 1200ML W/VALVE (MISCELLANEOUS) ×2 IMPLANT
CUFF TOURN 18 STER (MISCELLANEOUS) ×2 IMPLANT
CUFF TOURN DUAL PL 12 NO SLV (MISCELLANEOUS) IMPLANT
DRAPE FLUOR MINI C-ARM 54X84 (DRAPES) IMPLANT
DURAPREP 26ML APPLICATOR (WOUND CARE) ×2 IMPLANT
GAUZE FLUFF 18X24 1PLY STRL (GAUZE/BANDAGES/DRESSINGS) ×2 IMPLANT
GAUZE PETRO XEROFOAM 1X8 (MISCELLANEOUS) ×2 IMPLANT
GAUZE SPONGE 4X4 12PLY STRL (GAUZE/BANDAGES/DRESSINGS) ×2 IMPLANT
GAUZE STRETCH 2X75IN STRL (MISCELLANEOUS) ×2 IMPLANT
GLOVE BIO SURGEON STRL SZ7.5 (GLOVE) ×4 IMPLANT
GLOVE INDICATOR 8.0 STRL GRN (GLOVE) ×4 IMPLANT
GOWN STRL REUS W/ TWL LRG LVL3 (GOWN DISPOSABLE) ×2 IMPLANT
GOWN STRL REUS W/TWL LRG LVL3 (GOWN DISPOSABLE) ×2
HANDPIECE VERSAJET DEBRIDEMENT (MISCELLANEOUS) IMPLANT
KIT RM TURNOVER STRD PROC AR (KITS) ×2 IMPLANT
LABEL OR SOLS (LABEL) ×2 IMPLANT
NEEDLE FILTER BLUNT 18X 1/2SAF (NEEDLE) ×1
NEEDLE FILTER BLUNT 18X1 1/2 (NEEDLE) ×1 IMPLANT
NEEDLE HYPO 25X1 1.5 SAFETY (NEEDLE) ×2 IMPLANT
NS IRRIG 500ML POUR BTL (IV SOLUTION) ×2 IMPLANT
PACK EXTREMITY ARMC (MISCELLANEOUS) ×2 IMPLANT
PAD GROUND ADULT SPLIT (MISCELLANEOUS) ×2 IMPLANT
PENCIL ELECTRO HAND CTR (MISCELLANEOUS) IMPLANT
SOL .9 NS 3000ML IRR  AL (IV SOLUTION)
SOL .9 NS 3000ML IRR UROMATIC (IV SOLUTION) IMPLANT
SOL PREP PVP 2OZ (MISCELLANEOUS) ×2
SOLUTION PREP PVP 2OZ (MISCELLANEOUS) ×1 IMPLANT
SPONGE XRAY 4X4 16PLY STRL (MISCELLANEOUS) ×2 IMPLANT
STOCKINETTE STRL 6IN 960660 (GAUZE/BANDAGES/DRESSINGS) ×2 IMPLANT
STRIP CLOSURE SKIN 1/4X4 (GAUZE/BANDAGES/DRESSINGS) ×2 IMPLANT
SUT ETHILON 4-0 (SUTURE) ×1
SUT ETHILON 4-0 FS2 18XMFL BLK (SUTURE) ×1
SUT ETHILON 5-0 FS-2 18 BLK (SUTURE) IMPLANT
SUTURE ETHLN 4-0 FS2 18XMF BLK (SUTURE) ×1 IMPLANT
SWAB DUAL CULTURE TRANS RED ST (MISCELLANEOUS) ×2 IMPLANT
SYRINGE 10CC LL (SYRINGE) IMPLANT

## 2015-06-10 NOTE — Clinical Documentation Improvement (Signed)
Hospitalist  A cause and effect relationship may not be assumed and must be documented by a provider.  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 A specific answer is not anticipated or expected.   Thank You,  Jerral Ralph  RN BSN CCDS [phone] Health Information Management [facility]

## 2015-06-10 NOTE — H&P (View-Only) (Signed)
Reason for Consult: Gangrene with osteomyelitis left great toe Referring Physician: Prime doc  Raymond Avery is an 59 y.o. male.  HPI: The patient relates a history of an ulcer starting on his left great toe about 1 month ago. States he had some significant increased redness and drainage from the area couple of weeks ago. He denies any injury. About 1 week ago he did start taking some clindamycin that he was able to procure. More recently over the last week he did start to have some fever and chills and presented to the office for evaluation. Gangrene of the left great toe with apparent osteomyelitis was noted and he was sent to the hospital for admission and planning for surgical amputation of the left great toe.  Past Medical History  Diagnosis Date  . Systemic inflammatory response syndrome   . UTI (urinary tract infection)   . Diabetes   . Hypertension   . Hyponatremia   . Leukocytosis   . Arrhythmia   . A-fib   . Acute renal failure     History reviewed. No pertinent past surgical history.  Family History  Problem Relation Age of Onset  . Heart attack Mother     Social History:  reports that he has never smoked. He does not have any smokeless tobacco history on file. His alcohol and drug histories are not on file.  Allergies: No Known Allergies  Medications: I have reviewed the patient's current medications.  Results for orders placed or performed during the hospital encounter of 06/09/15 (from the past 48 hour(s))  CBC WITH DIFFERENTIAL     Status: Abnormal   Collection Time: 06/09/15  1:11 PM  Result Value Ref Range   WBC 12.9 (H) 3.8 - 10.6 K/uL   RBC 4.46 4.40 - 5.90 MIL/uL   Hemoglobin 12.9 (L) 13.0 - 18.0 g/dL   HCT 38.5 (L) 40.0 - 52.0 %   MCV 86.4 80.0 - 100.0 fL   MCH 29.0 26.0 - 34.0 pg   MCHC 33.6 32.0 - 36.0 g/dL   RDW 13.3 11.5 - 14.5 %   Platelets 290 150 - 440 K/uL   Neutrophils Relative % 77 %   Neutro Abs 9.8 (H) 1.4 - 6.5 K/uL   Lymphocytes  Relative 7 %   Lymphs Abs 0.9 (L) 1.0 - 3.6 K/uL   Monocytes Relative 15 %   Monocytes Absolute 1.9 (H) 0.2 - 1.0 K/uL   Eosinophils Relative 1 %   Eosinophils Absolute 0.1 0 - 0.7 K/uL   Basophils Relative 0 %   Basophils Absolute 0.0 0 - 0.1 K/uL  Comprehensive metabolic panel     Status: Abnormal   Collection Time: 06/09/15  1:11 PM  Result Value Ref Range   Sodium 130 (L) 135 - 145 mmol/L   Potassium 4.0 3.5 - 5.1 mmol/L   Chloride 97 (L) 101 - 111 mmol/L   CO2 22 22 - 32 mmol/L   Glucose, Bld 222 (H) 65 - 99 mg/dL   BUN 32 (H) 6 - 20 mg/dL   Creatinine, Ser 1.51 (H) 0.61 - 1.24 mg/dL   Calcium 8.8 (L) 8.9 - 10.3 mg/dL   Total Protein 7.9 6.5 - 8.1 g/dL   Albumin 3.4 (L) 3.5 - 5.0 g/dL   AST 23 15 - 41 U/L   ALT 27 17 - 63 U/L   Alkaline Phosphatase 66 38 - 126 U/L   Total Bilirubin 0.8 0.3 - 1.2 mg/dL   GFR calc non Af Amer   48 (L) >60 mL/min   GFR calc Af Amer 56 (L) >60 mL/min    Comment: (NOTE) The eGFR has been calculated using the CKD EPI equation. This calculation has not been validated in all clinical situations. eGFR's persistently <60 mL/min signify possible Chronic Kidney Disease.    Anion gap 11 5 - 15  Glucose, capillary     Status: Abnormal   Collection Time: 06/09/15  5:00 PM  Result Value Ref Range   Glucose-Capillary 224 (H) 65 - 99 mg/dL   Comment 1 Notify RN     Dg Foot Complete Left  06/09/2015   CLINICAL DATA:  Left foot cellulitis.  EXAM: LEFT FOOT - COMPLETE 3+ VIEW  COMPARISON:  None.  FINDINGS: Lytic destruction is seen involving the first distal phalanx consistent with osteomyelitis. The degenerative changes seen involving several tarsometatarsal joints. Vascular calcifications are noted.  IMPRESSION: Lytic destruction of the first distal phalanx consistent with osteomyelitis.   Electronically Signed   By: James  Green Jr, M.D.   On: 06/09/2015 14:04    Review of Systems  Constitutional: Positive for fever and chills.  HENT: Negative.    Eyes: Negative.   Respiratory: Negative.   Cardiovascular: Negative.   Gastrointestinal: Negative.   Genitourinary: Negative.   Musculoskeletal: Negative.   Skin:       Significant swelling and redness in the left leg as compared to the right. Heavy drainage from the wound on his left great toe.  Neurological:       He does relate numbness and paresthesias in his feet from his diabetic neuropathy  Endo/Heme/Allergies: Negative.   Psychiatric/Behavioral: Negative.    Blood pressure 161/75, pulse 77, temperature 99.3 F (37.4 C), temperature source Oral, resp. rate 18, height 6' 3" (1.905 m), weight 142.883 kg (315 lb), SpO2 95 %. Physical Exam  Cardiovascular:  DP and PT pulses are 2 over 4 on the right. DP pulse is barely palpable on the left and PT pulses 1 over 4 on the left.  Musculoskeletal:  Adequate range of motion of the pedal joints. On motion of the left great toe there is an obvious fracture in the distal phalanx, most likely from bone infection. Previous amputation of the right great toe is noted.  Neurological:  Loss of protective threshold monofilament wire distally in the forefoot and toes bilateral.  Skin:  The skin is warm dry and supple. Necrosis of the distal aspect of the left hallux with heavy necrotic tissue that extends directly down to exposed bone on the dorsal aspect of the toe. Significant edema and cellulitis in the entire left foot, ankle, and lower extremity. Heavy drainage from the wound on the great toe. Significant malodor is also noted.    Assessment/Plan: Assessment: 1. Gangrene with osteomyelitis left great toe. 2. Diabetes mellitus with associated neuropathy  Plan: Discussed with the patient the need for amputation of the left great toe. We will plan for this tomorrow in the early afternoon. Also discussed that he has significant calcified blood vessels which were noted on his x-ray. Due to the fact that his pulses are not as palpable on the left we  will obtain a vascular consultation after his amputation. Most likely will need circulation evaluated inpatient versus outpatient. He will be nothing by mouth after midnight tonight. Consent form for amputation of the left great toe. Plan for surgery tomorrow  CLINE,TODD W. 06/09/2015, 5:40 PM      

## 2015-06-10 NOTE — Progress Notes (Signed)
ANTIBIOTIC CONSULT NOTE - FOLLOW UP  Pharmacy Consult for vancomycin   Indication: osteomyelitis   No Known Allergies  Patient Measurements: Height:  (190.5 cm) Weight: (!) 315 lb (142.883 kg) IBW/kg (Calculated) : 84.5 Adjusted Body Weight: 107.86  Vital Signs: Temp: 98.1 F (36.7 C) (09/20 0800) Temp Source: Oral (09/20 0800) BP: 145/48 mmHg (09/20 0800) Pulse Rate: 50 (09/20 0800) Intake/Output from previous day: 09/19 0701 - 09/20 0700 In: 240 [P.O.:240] Out: 2075 [Urine:2075] Intake/Output from this shift: Total I/O In: 1251.3 [I.V.:1101.3; IV Piggyback:150] Out: -   Labs:  Recent Labs  06/09/15 1311 06/10/15 0424  WBC 12.9* 11.2*  HGB 12.9* 12.1*  PLT 290 239  CREATININE 1.51* 1.22   Estimated Creatinine Clearance: 97 mL/min (by C-G formula based on Cr of 1.22). No results for input(s): VANCOTROUGH, VANCOPEAK, VANCORANDOM, GENTTROUGH, GENTPEAK, GENTRANDOM, TOBRATROUGH, TOBRAPEAK, TOBRARND, AMIKACINPEAK, AMIKACINTROU, AMIKACIN in the last 72 hours.   Microbiology: Recent Results (from the past 720 hour(s))  Culture, blood (routine x 2)     Status: None (Preliminary result)   Collection Time: 06/09/15  1:11 PM  Result Value Ref Range Status   Specimen Description BLOOD LEFT ARM  Final   Special Requests BOTTLES DRAWN AEROBIC AND ANAEROBIC 10CC  Final   Culture NO GROWTH < 24 HOURS  Final   Report Status PENDING  Incomplete  Culture, blood (routine x 2)     Status: None (Preliminary result)   Collection Time: 06/09/15  1:11 PM  Result Value Ref Range Status   Specimen Description BLOOD LEFT HAND  Final   Special Requests BOTTLES DRAWN AEROBIC AND ANAEROBIC 10CC  Final   Culture NO GROWTH < 24 HOURS  Final   Report Status PENDING  Incomplete    Anti-infectives    Start     Dose/Rate Route Frequency Ordered Stop   06/10/15 1200  vancomycin (VANCOCIN) 1,250 mg in sodium chloride 0.9 % 250 mL IVPB     1,250 mg 166.7 mL/hr over 90 Minutes Intravenous  Every 8 hours 06/10/15 0857     06/10/15 0400  vancomycin (VANCOCIN) 1,500 mg in sodium chloride 0.9 % 500 mL IVPB  Status:  Discontinued     1,500 mg 250 mL/hr over 120 Minutes Intravenous Every 12 hours 06/09/15 1517 06/09/15 1716   06/10/15 0300  vancomycin (VANCOCIN) 1,500 mg in sodium chloride 0.9 % 500 mL IVPB  Status:  Discontinued     1,500 mg 250 mL/hr over 120 Minutes Intravenous Every 12 hours 06/09/15 1801 06/10/15 0854   06/09/15 1545  vancomycin (VANCOCIN) 2,000 mg in sodium chloride 0.9 % 500 mL IVPB  Status:  Discontinued     2,000 mg 250 mL/hr over 120 Minutes Intravenous  Once 06/09/15 1532 06/09/15 1535   06/09/15 1545  vancomycin (VANCOCIN) 1,500 mg in sodium chloride 0.9 % 500 mL IVPB     1,500 mg 250 mL/hr over 120 Minutes Intravenous  Once 06/09/15 1541 06/09/15 2332   06/09/15 1515  vancomycin (VANCOCIN) 2,500 mg in sodium chloride 0.9 % 500 mL IVPB  Status:  Discontinued     2,500 mg 250 mL/hr over 120 Minutes Intravenous  Once 06/09/15 1511 06/09/15 1531   06/09/15 1515  vancomycin (VANCOCIN) 1,500 mg in sodium chloride 0.9 % 500 mL IVPB  Status:  Discontinued     1,500 mg 250 mL/hr over 120 Minutes Intravenous Every 12 hours 06/09/15 1511 06/09/15 1517   06/09/15 1515  piperacillin-tazobactam (ZOSYN) IVPB 3.375 g  3.375 g 12.5 mL/hr over 240 Minutes Intravenous 3 times per day 06/09/15 1511     06/09/15 1315  vancomycin (VANCOCIN) IVPB 1000 mg/200 mL premix  Status:  Discontinued     1,000 mg 200 mL/hr over 60 Minutes Intravenous Every 24 hours 06/09/15 1306 06/09/15 1511   06/09/15 1315  piperacillin-tazobactam (ZOSYN) IVPB 3.375 g  Status:  Discontinued     3.375 g 100 mL/hr over 30 Minutes Intravenous 4 times per day 06/09/15 1306 06/09/15 1511      Assessment: 59 yo patient with PMH of diabetes was admitted with gangrene and osteomyelitis of the left great toe. Patient scheduled for amputation today. Pharmacy has been consulted for Vancomycin dosing  and monitoring. Patient originally started on vancomycin  q12 on 9/19. Patients CrCl and Scr improved to 97 ml/min and Scr 1.22.   Goal of Therapy:  Vancomycin trough level 15-20 mcg/ml  Plan:  Will change patients regimen to vancomycin  q8 hours due to CrCl improvement. Trough ordered for 9/21 at 1130 and Scr ordered for 0500. Pharmacy will monitor renal function and trough closely and make adjustments as needed.   Cher Nakai, PharmD Pharmacy Resident

## 2015-06-10 NOTE — Anesthesia Postprocedure Evaluation (Signed)
  Anesthesia Post-op Note  Patient: Raymond Avery  Procedure(s) Performed: Procedure(s): Left great toe amputation  (Left)  Anesthesia type:General  Patient location: PACU  Post pain: Pain level controlled  Post assessment: Post-op Vital signs reviewed, Patient's Cardiovascular Status Stable, Respiratory Function Stable, Patent Airway and No signs of Nausea or vomiting  Post vital signs: Reviewed and stable  Last Vitals:  Filed Vitals:   06/10/15 1354  BP: 136/75  Pulse: 61  Temp: 37.9 C  Resp: 18    Level of consciousness: awake, alert  and patient cooperative  Complications: No apparent anesthesia complications

## 2015-06-10 NOTE — Progress Notes (Signed)
Patient ID: Raymond Avery, male   DOB: 1956/07/06, 59 y.o.   MRN: [id number] [facility] PROGRESS NOTE  HPI/Subjective: Patient awaiting surgery today. He feels okay. No complaints of chest pain or shortness of breath. Not much pain in the toe. He states that his left first toe has been swollen for about a month.   Objective: Filed Vitals:   06/10/15 0721  BP: 144/66  Pulse: 76  Temp: 99.4 F (37.4 C)  Resp: 18    Filed Weights   06/09/15 1509  Weight: 142.883 kg (315 lb)    ROS: Review of Systems  Constitutional: Positive for fever. Negative for chills.  Eyes: Negative for blurred vision.  Respiratory: Negative for cough and shortness of breath.   Cardiovascular: Negative for chest pain.  Gastrointestinal: Negative for nausea, vomiting, abdominal pain, diarrhea and constipation.  Genitourinary: Negative for dysuria.  Musculoskeletal: Negative for joint pain.  Neurological: Negative for dizziness and headaches.   Exam: Physical Exam  Constitutional: He is oriented to person, place, and time.  HENT:  Nose: No mucosal edema.  Mouth/Throat: No oropharyngeal exudate or posterior oropharyngeal edema.  Eyes: Conjunctivae, EOM and lids are normal. Pupils are equal, round, and reactive to light.  Neck: No JVD present. Carotid bruit is not present. No edema present. No thyroid mass and no thyromegaly present.  Cardiovascular: S1 normal and S2 normal.  Exam reveals no gallop.   No murmur heard. Pulses:      Dorsalis pedis pulses are 2+ on the right side, and 2+ on the left side.  Respiratory: No respiratory distress. He has no wheezes. He has no rhonchi. He has no rales.  GI: Soft. Bowel sounds are normal. There is no tenderness.  Musculoskeletal:       Right ankle: He exhibits swelling.       Left ankle: He exhibits swelling.  Lymphadenopathy:    He has no cervical adenopathy.  Neurological: He is alert and oriented to person, place, and time. No cranial nerve  deficit.  Skin: Skin is warm. No rash noted. Nails show no clubbing.  Left first toe very swollen and covered by bandage.  Psychiatric: He has a normal mood and affect.    Data Reviewed: Basic Metabolic Panel:  Recent Labs Lab 06/09/15 1311 06/10/15 0424  NA 130* 133*  K 4.0 3.9  CL 97* 102  CO2 22 25  GLUCOSE 222* 211*  BUN 32* 25*  CREATININE 1.51* 1.22  CALCIUM 8.8* 8.4*   Liver Function Tests:  Recent Labs Lab 06/09/15 1311  AST 23  ALT 27  ALKPHOS 66  BILITOT 0.8  PROT 7.9  ALBUMIN 3.4*   CBC:  Recent Labs Lab 06/09/15 1311 06/10/15 0424  WBC 12.9* 11.2*  NEUTROABS 9.8*  --   HGB 12.9* 12.1*  HCT 38.5* 35.4*  MCV 86.4 87.1  PLT 290 239   CBG:  Recent Labs Lab 06/09/15 1700 06/09/15 2111 06/10/15 0719  GLUCAP 224* 181* 225*    Recent Results (from the past 240 hour(s))  Culture, blood (routine x 2)     Status: None (Preliminary result)   Collection Time: 06/09/15  1:11 PM  Result Value Ref Range Status   Specimen Description BLOOD LEFT ARM  Final   Special Requests BOTTLES DRAWN AEROBIC AND ANAEROBIC 10CC  Final   Culture NO GROWTH < 24 HOURS  Final   Report Status PENDING  Incomplete  Culture, blood (routine x 2)     Status: None (Preliminary result)  Collection Time: 06/09/15  1:11 PM  Result Value Ref Range Status   Specimen Description BLOOD LEFT HAND  Final   Special Requests BOTTLES DRAWN AEROBIC AND ANAEROBIC 10CC  Final   Culture NO GROWTH < 24 HOURS  Final   Report Status PENDING  Incomplete     Studies: Dg Foot Complete Left  06/09/2015   CLINICAL DATA:  Left foot cellulitis.  EXAM: LEFT FOOT - COMPLETE 3+ VIEW  COMPARISON:  None.  FINDINGS: Lytic destruction is seen involving the first distal phalanx consistent with osteomyelitis. The degenerative changes seen involving several tarsometatarsal joints. Vascular calcifications are noted.  IMPRESSION: Lytic destruction of the first distal phalanx consistent with osteomyelitis.    Electronically Signed   By: Lupita Raider, M.D.   On: 06/09/2015 14:04    Scheduled Meds: . enalapril  20 mg Oral Daily  . hydrALAZINE  100 mg Oral BID  . insulin aspart  0-15 Units Subcutaneous TID WC  . insulin aspart  0-5 Units Subcutaneous QHS  . metoprolol succinate  25 mg Oral Daily  . piperacillin-tazobactam (ZOSYN)  IV  3.375 g Intravenous 3 times per day  . vancomycin  1,500 mg Intravenous Q12H   Continuous Infusions: . sodium chloride 75 mL/hr at 06/09/15 1727    Assessment/Plan: 1. Clinical sepsis, osteomyelitis of the left first toe. No contraindications to surgery at this time. Continue vancomycin and Zosyn. EKG reviewed. No chest pain or shortness of breath with exertion. 2. Acute Kidney injury- improving with fluids. 3. Essential HTN- continue medications 4. Type 2 Diabetes with neuropathy- holding diabetic meds prior to OR 5. Hx afib- holding xaralto  Code Status:     Code Status Orders        Start     Ordered   06/09/15 1302  Full code   Continuous     06/09/15 1306     Disposition Plan: watch post op for a few days  Consultants:  Podiatry  Time spent:  Alford Highland  [facility]

## 2015-06-10 NOTE — Care Management Note (Addendum)
Case Management Note  Patient Details  Name: Raymond Avery MRN: [id number] Date of Birth: [birth year]  Subjective/Objective:                   Met with patient to discuss discharge planning. His PCP is with [facility] in [place] [phone] but he states he has not been there in over a year. He does not follow his blood sugars either. His podiatrist is with [facility]- Dr. Cleda Mccreedy. He uses [facility] [employer]. He lives with his 79 year old son. He is independent with mobility and drives. He declines RNCM needs.  Action/Plan:  Appointment made with Dr. Salome Holmes with [facility] on 06/18/15 at [phone]. Appointment made with Dr. Sharlotte Alamo on 06/23/15 at [phone].    Expected Discharge Date:                  Expected Discharge Plan:     In-House Referral:     Discharge planning Services  CM Consult, NA  Post Acute Care Choice:    Choice offered to:  Patient  DME Arranged:    DME Agency:     HH Arranged:    [place] Agency:     Status of Service:  Completed, signed off  Medicare Important Message Given:    Date Medicare IM Given:    Medicare IM give by:    Date Additional Medicare IM Given:    Additional Medicare Important Message give by:     If discussed at [place] of Stay Meetings, dates discussed:    Additional Comments:  Lamichael Youkhana, RN 06/10/2015, 9:39 AM

## 2015-06-10 NOTE — Progress Notes (Signed)
CBG in PACU= 165

## 2015-06-10 NOTE — Progress Notes (Signed)
Inpatient Diabetes Program Recommendations  AACE/ADA: New Consensus Statement on Inpatient Glycemic Control (2015)  Target Ranges:  Prepandial:   less than 140 mg/dL      Peak postprandial:   less than 180 mg/dL (1-2 hours)      Critically ill patients:  140 - 180 mg/dL   Results for Raymond Avery, Raymond Avery (MRN [id number]) as of 06/10/2015 08:55  Ref. Range 06/09/2015 17:00 06/09/2015 21:11 06/10/2015 07:19  Glucose-Capillary Latest Ref Range: 65-99 mg/dL 409 (H) 811 (H) 914 (H)    Review of Glycemic Control  Diabetes history: Type 2 Outpatient Diabetes medications: Levemir 30 units qhs, Glipizide  bid Current orders for Inpatient glycemic control: Novolog 0-5 units qhs, Novolog 0-15 units tid   Inpatient Diabetes Program Recommendations:  1. Consider obtaining an A1C to determine blood sugar control at home 2. Consider adding starting 1/2 home dose of basal insulin, Levemir 15 units qhs 3. Because of this patient's weight, consider increasing Novolog correction to resistant scale 0-20 units Novolog tid   Susette Racer, RN, [place], [place], CDE Diabetes Coordinator Inpatient Diabetes Program  [phone] (Team Pager) [phone] [facility] Office) 06/10/2015 8:59 AM

## 2015-06-10 NOTE — Consult Note (Signed)
[facility] Vascular Consult Note  MRN : [id number]  Raymond Avery is a 59 y.o. (Mar 12, 1956) male who presents with chief complaint of No chief complaint on file. Marland Kitchen  History of Present Illness: Patient admitted with necrotizing infection in left foot/toe from diabetic toe ulceration.  Had digital amputation today by Dr. Alberteen Spindle.  Had reasonably bleeding at the time of surgery, but has vascular calcifications of Xray.  Did not describe ischemic rest pain or claudication symptoms prior to this episode.  Already had a toe amputation on the right foot.  Long term diabetic with suboptimal control.  Currently not having a lot of pain, but recently out of surgery.    Current Facility-Administered Medications  Medication Dose Route Frequency Provider Last Rate Last Dose  . 0.9 %  sodium chloride infusion  250 mL Intravenous PRN Linus Galas, MD      . acetaminophen (TYLENOL) tablet 650 mg  650 mg Oral Q6H PRN Katha Hamming, MD       Or  . acetaminophen (TYLENOL) suppository 650 mg  650 mg Rectal Q6H PRN Katha Hamming, MD      . Melene Muller ON 06/11/2015] enalapril (VASOTEC) tablet 10 mg  10 mg Oral BID Alford Highland, MD      . fentaNYL (SUBLIMAZE) injection 25 mcg  25 mcg Intravenous Q5 min PRN Naomie Dean, MD      . hydrALAZINE (APRESOLINE) tablet 100 mg  100 mg Oral BID Katha Hamming, MD   100 mg at 06/09/15 1727  . insulin aspart (novoLOG) injection 0-15 Units  0-15 Units Subcutaneous TID WC Katha Hamming, MD   3 Units at 06/10/15 1151  . insulin aspart (novoLOG) injection 0-5 Units  0-5 Units Subcutaneous QHS Katha Hamming, MD   0 Units at 06/09/15 2128  . metoprolol succinate (TOPROL-XL) 24 hr tablet 25 mg  25 mg Oral Daily Katha Hamming, MD   25 mg at 06/10/15 0756  . morphine 4 MG/ML injection 4 mg  4 mg Intravenous Q2H PRN Linus Galas, MD      . ondansetron [facility]) injection 4 mg  4 mg Intravenous Once PRN Naomie Dean, MD      .  oxyCODONE-acetaminophen (PERCOCET/ROXICET) 5-325 MG per tablet 1-2 tablet  1-2 tablet Oral Q4H PRN Linus Galas, MD      . piperacillin-tazobactam (ZOSYN) IVPB 3.375 g  3.375 g Intravenous 3 times per day Olene Floss, RPH   3.375 g at 06/10/15 1557  . promethazine (PHENERGAN) tablet 12.5 mg  12.5 mg Oral Q4H PRN Linus Galas, MD      . senna-docusate (Senokot-S) tablet 1 tablet  1 tablet Oral QHS PRN Katha Hamming, MD      . sodium chloride 0.9 % injection 3 mL  3 mL Intravenous Q12H Linus Galas, MD   3 mL at 06/10/15 1500  . sodium chloride 0.9 % injection 3 mL  3 mL Intravenous PRN Linus Galas, MD      . traZODone (DESYREL) tablet 25 mg  25 mg Oral QHS PRN Linus Galas, MD      . vancomycin (VANCOCIN) 1,250 mg in sodium chloride 0.9 % 250 mL IVPB  1,250 mg Intravenous Q8H Katha Hamming, MD   1,250 mg at 06/10/15 1255    Past Medical History  Diagnosis Date  . Systemic inflammatory response syndrome   . UTI (urinary tract infection)   . Diabetes   . Hypertension   . Hyponatremia   . Leukocytosis   .  Arrhythmia   . A-fib   . Acute renal failure   . CHF (congestive Avery failure)     History reviewed. No pertinent past surgical history.  Social History Social History  Substance Use Topics  . Smoking status: Never Smoker   . Smokeless tobacco: None  . Alcohol Use: None  No IVDU  Family History Family History  Problem Relation Age of Onset  . Avery attack Mother   no bleeding or clotting disorders, no autoimmune diseases  No Known Allergies   REVIEW OF SYSTEMS (Negative unless checked)  Constitutional: Weight loss  Fever  Chills Cardiac: Chest pain   Chest pressure   Palpitations   Shortness of breath when laying flat   Shortness of breath at rest   Shortness of breath with exertion. Vascular:  Pain in legs with walking   Pain in legs at rest   Pain in legs when laying flat   Claudication   Pain in feet when walking  Pain in feet at  rest  Pain in feet when laying flat   History of DVT   Phlebitis   Swelling in legs   Varicose veins   Non-healing ulcers Pulmonary:   Uses home oxygen   Productive cough   Hemoptysis   Wheeze  COPD   Asthma Neurologic:  Dizziness  Blackouts   Seizures   History of stroke   History of TIA  Aphasia   Temporary blindness   Dysphagia   Weakness or numbness in arms   Weakness or numbness in legs Musculoskeletal:  Arthritis   Joint swelling   Joint pain   Low back pain Hematologic:  Easy bruising  Easy bleeding   Hypercoagulable state   Anemic  Hepatitis Gastrointestinal:  Blood in stool   Vomiting blood  Gastroesophageal reflux/heartburn   Difficulty swallowing. Genitourinary:  Chronic kidney disease   Difficult urination  Frequent urination  Burning with urination   Blood in urine Skin:  Rashes   Ulcers   Wounds Psychological:  History of anxiety    History of major depression.  Physical Examination  Filed Vitals:   06/10/15 0800 06/10/15 1353 06/10/15 1354 06/10/15 1537  BP: 145/48  136/75 134/58  Pulse: 50  61 75  Temp: 98.1 F (36.7 C) 100.1 F (37.8 C) 100.2 F (37.9 C) 99 F (37.2 C)  TempSrc: Oral  Tympanic Oral  Resp: Height:      Weight:      SpO2: 100%  100% 94%   Body mass index is 39.37 kg/(m^2). Gen:  WD/WN, NAD Head: [place]/AT, No temporalis wasting. Prominent temp pulse not noted. Ear/Nose/Throat: Hearing grossly intact, nares w/o erythema or drainage, oropharynx w/o Erythema/Exudate Eyes: PERRLA, EOMI.  Neck: Supple, no nuchal rigidity.  No bruit or JVD.  Pulmonary:  Good air movement, clear to auscultation bilaterally.  Cardiac: irregularly irregular Vascular:  Vessel Right Left  Radial Palpable Palpable  Ulnar Palpable Palpable  Brachial Palpable Palpable  Carotid Palpable, without bruit Palpable, without bruit  Aorta Not palpable N/A  Femoral Palpable Palpable   Popliteal Palpable Palpable  PT Not Palpable Not Palpable (bulky dressing)  DP Palpable Not Palpable (bulky dressing)   Gastrointestinal: soft, non-tender/non-distended. No guarding/reflex. No masses, surgical incisions, or scars. Musculoskeletal: M/S 5/5 throughout.  Right great toe amputation. Left foot in bulky dressing. 1-2+ edema. Neurologic: CN 2-12 intact. Pain and light touch intact in extremities.  Symmetrical.  Speech is fluent. Motor exam as listed above. Psychiatric: Judgment  intact, Mood & affect appropriate for pt's clinical situation. Dermatologic: left foot in bulky dressing after toe amputation today Lymph : No Cervical, Axillary, or Inguinal lymphadenopathy.      CBC Lab Results  Component Value Date   WBC 11.2* 06/10/2015   HGB 12.1* 06/10/2015   HCT 35.4* 06/10/2015   MCV 87.1 06/10/2015   PLT 239 06/10/2015    BMET    Component Value Date/Time   NA 133* 06/10/2015 0424   NA 133* 09/25/2013 1151   K 3.9 06/10/2015 0424   K 4.2 09/25/2013 1151   CL 102 06/10/2015 0424   CL 101 09/25/2013 1151   CO2 25 06/10/2015 0424   CO2 28 09/25/2013 1151   GLUCOSE 211* 06/10/2015 0424   GLUCOSE 158* 09/25/2013 1151   BUN 25* 06/10/2015 0424   BUN 13 09/25/2013 1151   CREATININE 1.22 06/10/2015 0424   CREATININE 1.07 09/25/2013 1151   CALCIUM 8.4* 06/10/2015 0424   CALCIUM 9.4 09/25/2013 1151   GFRNONAA >60 06/10/2015 0424   GFRNONAA >60 09/25/2013 1151   GFRAA >60 06/10/2015 0424   GFRAA >60 09/25/2013 1151   Estimated Creatinine Clearance: 97 mL/min (by C-G formula based on Cr of 1.22).  COAG No results found for: INR, PROTIME  Radiology Dg Foot Complete Left  06/09/2015   CLINICAL DATA:  Left foot cellulitis.  EXAM: LEFT FOOT - COMPLETE 3+ VIEW  COMPARISON:  None.  FINDINGS: Lytic destruction is seen involving the first distal phalanx consistent with osteomyelitis. The degenerative changes seen involving several tarsometatarsal joints. Vascular  calcifications are noted.  IMPRESSION: Lytic destruction of the first distal phalanx consistent with osteomyelitis.   Electronically Signed   By: Lupita Raider, M.D.   On: 06/09/2015 14:04      Assessment/Plan 1. Diabetic foot infection with necrosis.  Required left first digital amputation.  Wound currently dressed.  By report the bleeding was fair at surgery today 2. Vascular calcifications on Xrays.  Likely medial calcification from PAD.  Would recommend outpatient evaluation as we do not have ABIs or arterial duplex available at this hospital and he does not appear to be terribly ischemic at current. 3. Diabetes. Poorly controlled. Improved sugar control will help with wound healing and slow progression of PAD. 4. HTN. Stable 5. Atrial fibrillation  Have discussed the case with Dr. Alberteen Spindle today who agrees with outpatient vascular evaluation.   DEW,JASON, MD  06/10/2015 4:10 PM

## 2015-06-10 NOTE — Interval H&P Note (Signed)
History and Physical Interval Note:  06/10/2015 12:38 PM  Raymond Avery  has presented today for surgery, with the diagnosis of Cellulitits of left foot   The various methods of treatment have been discussed with the patient and family. After consideration of risks, benefits and other options for treatment, the patient has consented to  Procedure(s): Left great toe amputation  (Left) as a surgical intervention .  The patient's history has been reviewed, patient examined, no change in status, stable for surgery.  I have reviewed the patient's chart and labs.  Questions were answered to the patient's satisfaction.     CLINE,TODD W.

## 2015-06-10 NOTE — Anesthesia Preprocedure Evaluation (Addendum)
Anesthesia Evaluation  Patient identified by MRN, date of birth, ID band Patient awake    Reviewed: Allergy & Precautions, NPO status , Patient's Chart, lab work & pertinent test results  History of Anesthesia Complications Negative for: history of anesthetic complications  Airway Mallampati: III   Neck ROM: Full    Dental  (+) Teeth Intact   Pulmonary neg pulmonary ROS,           Cardiovascular hypertension, Pt. on medications and Pt. on home beta blockers +CHF (hx 10 yrs ago)  + dysrhythmias (hx 2 yrs ago of afib)      Neuro/Psych negative neurological ROS     GI/Hepatic negative GI ROS, Neg liver ROS,   Endo/Other  diabetes, Type 2, Oral Hypoglycemic Agents  Renal/GU negative Renal ROS     Musculoskeletal   Abdominal   Peds  Hematology negative hematology ROS (+)   Anesthesia Other Findings   Reproductive/Obstetrics                            Anesthesia Physical Anesthesia Plan  ASA: III  Anesthesia Plan: General   Post-op Pain Management:    Induction: Intravenous  Airway Management Planned: Mask  Additional Equipment:   Intra-op Plan:   Post-operative Plan:   Informed Consent: I have reviewed the patients History and Physical, chart, labs and discussed the procedure including the risks, benefits and alternatives for the proposed anesthesia with the patient or authorized representative who has indicated his/her understanding and acceptance.     Plan Discussed with:   Anesthesia Plan Comments:         Anesthesia Quick Evaluation

## 2015-06-10 NOTE — Transfer of Care (Signed)
Immediate Anesthesia Transfer of Care Note  Patient: Raymond Avery  Procedure(s) Performed: Procedure(s): Left great toe amputation  (Left)  Patient Location: PACU  Anesthesia Type:General  Level of Consciousness: awake, alert  and oriented  Airway & Oxygen Therapy: Patient Spontanous Breathing  Post-op Assessment: Report given to RN  Post vital signs: Reviewed and stable  Last Vitals:  Filed Vitals:   06/10/15 1354  BP: 139/75  Pulse: 64  Temp: 37.9 C  Resp: 18    Complications: No apparent anesthesia complications

## 2015-06-10 NOTE — Op Note (Signed)
Date of operation: 06/10/2015.  Surgeon: Ricci Barker DPM.  Preoperative diagnosis: Osteomyelitis with gangrene left great toe.  Postoperative diagnosis: Same.  Procedure: Amputation left great toe.  Anesthesia: Local Mac.  Hemostasis: Pneumatic tourniquet left ankle 250 mmHg area  Estimated blood loss: 50 cc.  Materials: None.  Pathology: Left great toe.  Cultures: Bone distal phalanx left great toe.  Complications: None apparent.  Operative indications: This is a 59 year old male with a one-month history of an ulcer on his left great toe. Presented to the office yesterday with obvious gangrene and osteomyelitis. Was admitted to the hospital for IV antibiotics and amputation of the left great toe.  Operative procedure: Patient was taken to the operating room and placed on the table in the supine position. Following satisfactory sedation the left foot was anesthetized around the first metatarsal with 10 cc of 0.5% Sensorcaine plain. A pneumatic tourniquet was applied at the level of the left ankle and the foot was prepped and draped in the usual sterile fashion. The foot was exsanguinated and the tourniquet inflated to 250 mmHg. Attention was then directed to the dorsal aspect of the left foot where an elliptical incision was made coursing dorsal to plantar around the base of the great toe. The incision was carried sharply down to the level of bone. Significant bleeding was noted. The tourniquet was then inflated to 300 mg mercury and the patient continued to bleed through the tourniquet. At this point the tourniquet was released and the bleeding was noted to slow down. Periosteal dissection carried back to the level of the base of the proximal phalanx where the bone was incised using a bone saw and then the distal aspect of the hallux was removed in toto. Bleeders were identified and coagulated. Some devitalized tissue was noted along the plantar aspect of the wound down to the level of  the deep subcutaneous tissue and this was excisionally debrided out using a ronguer. The wound was then flushed with copious amounts of sterile saline and the incision was closed using 4-0 nylon vertical mattress and simple interrupted sutures. Xeroform and a sterile gauze bandage were applied followed by Kerlix and an Ace wrap. The patient tolerated the procedure and anesthesia well and was transported to the PACU with vital signs stable and in good condition.

## 2015-06-11 LAB — VANCOMYCIN, TROUGH: Vancomycin Tr: 25 ug/mL (ref 10–20)

## 2015-06-11 LAB — CREATININE, SERUM
CREATININE: 1.2 mg/dL (ref 0.61–1.24)
GFR calc Af Amer: >60 mL/min (ref >60)
GFR calc non Af Amer: >60 mL/min (ref >60)

## 2015-06-11 LAB — SURGICAL PATHOLOGY

## 2015-06-11 LAB — VANCOMYCIN, RANDOM: Vancomycin Rm: 17 ug/mL

## 2015-06-11 LAB — GLUCOSE, CAPILLARY
GLUCOSE-CAPILLARY: 217 mg/dL — AB (ref 65–99)
GLUCOSE-CAPILLARY: 186 mg/dL — AB (ref 65–99)
Glucose-Capillary: 139 mg/dL — ABNORMAL HIGH (ref 65–99)

## 2015-06-11 MED ORDER — VANCOMYCIN HCL 10 G IV SOLR
1500.0000 mg | Freq: Two times a day (BID) | INTRAVENOUS | Status: DC
  Administered 2015-06-11 – 2015-06-12 (×3): 1500 mg via INTRAVENOUS
  Filled 2015-06-11 (×4): qty 1500

## 2015-06-11 MED ORDER — VANCOMYCIN HCL 10 G IV SOLR
1500.0000 mg | Freq: Two times a day (BID) | INTRAVENOUS | Status: DC | PRN
  Filled 2015-06-11: qty 1500

## 2015-06-11 MED ORDER — GLIPIZIDE 5 MG PO TABS
5.0000 mg | ORAL_TABLET | Freq: Two times a day (BID) | ORAL | Status: DC
  Administered 2015-06-11 – 2015-06-13 (×5): 5 mg via ORAL
  Filled 2015-06-11 (×5): qty 1

## 2015-06-11 MED ORDER — SODIUM CHLORIDE 0.9 % IV SOLN
1500.0000 mg | Freq: Two times a day (BID) | INTRAVENOUS | Status: DC
  Filled 2015-06-11 (×2): qty 1500

## 2015-06-11 MED ORDER — VANCOMYCIN HCL 10 G IV SOLR
1250.0000 mg | Freq: Two times a day (BID) | INTRAVENOUS | Status: DC | PRN
  Filled 2015-06-11: qty 1250

## 2015-06-11 MED ORDER — INSULIN DETEMIR 100 UNIT/ML ~~LOC~~ SOLN
20.0000 [IU] | Freq: Every day | SUBCUTANEOUS | Status: DC
  Filled 2015-06-11 (×2): qty 0.2

## 2015-06-11 NOTE — Progress Notes (Signed)
ANTIBIOTIC CONSULT NOTE - FOLLOW UP  Pharmacy Consult for vancomycin   Indication: osteomyelitis   No Known Allergies  Patient Measurements: Height:  (190.5 cm) Weight: (!) 315 lb (142.883 kg) IBW/kg (Calculated) : 84.5 Adjusted Body Weight: 107.86  Vital Signs: Temp: 98 F (36.7 C) (09/21 0706) Temp Source: Oral (09/21 0706) BP: 145/72 mmHg (09/21 0706) Pulse Rate: 67 (09/21 0706) Intake/Output from previous day: 09/20 0701 - 09/21 0700 In: 1941.3 [P.O.:240; I.V.:1401.3; IV Piggyback:300] Out: 2000 [Urine:2000] Intake/Output from this shift: Total I/O In: 240 [P.O.:240] Out: 300 [Urine:300]  Labs:  Recent Labs  06/09/15 1311 06/10/15 0424 06/11/15 0418  WBC 12.9* 11.2*  --   HGB 12.9* 12.1*  --   PLT 290 239  --   CREATININE 1.51* 1.22 1.20   Estimated Creatinine Clearance: 98.7 mL/min (by C-G formula based on Cr of 1.2).  Recent Labs  06/11/15 1112  VANCOTROUGH 25*     Microbiology: Recent Results (from the past 720 hour(s))  Culture, blood (routine x 2)     Status: None (Preliminary result)   Collection Time: 06/09/15  1:11 PM  Result Value Ref Range Status   Specimen Description BLOOD LEFT ARM  Final   Special Requests BOTTLES DRAWN AEROBIC AND ANAEROBIC 10CC  Final   Culture NO GROWTH 2 DAYS  Final   Report Status PENDING  Incomplete  Culture, blood (routine x 2)     Status: None (Preliminary result)   Collection Time: 06/09/15  1:11 PM  Result Value Ref Range Status   Specimen Description BLOOD LEFT HAND  Final   Special Requests BOTTLES DRAWN AEROBIC AND ANAEROBIC 10CC  Final   Culture NO GROWTH 2 DAYS  Final   Report Status PENDING  Incomplete  Tissue culture     Status: None (Preliminary result)   Collection Time: 06/10/15  1:22 PM  Result Value Ref Range Status   Specimen Description BONE  Final   Special Requests BONE  Final   Gram Stain PENDING  Incomplete   Culture   Final    MODERATE GROWTH GRAM NEGATIVE RODS IDENTIFICATION  AND SUSCEPTIBILITIES TO FOLLOW    Report Status PENDING  Incomplete    Anti-infectives    Start     Dose/Rate Route Frequency Ordered Stop   06/11/15 1700  vancomycin (VANCOCIN) 1,500 mg in sodium chloride 0.9 % 500 mL IVPB  Status:  Discontinued     1,500 mg 250 mL/hr over 120 Minutes Intravenous Every 12 hours 06/11/15 1319 06/11/15 1320   06/11/15 1321  vancomycin (VANCOCIN) 1,250 mg in sodium chloride 0.9 % 250 mL IVPB     1,250 mg 166.7 mL/hr over 90 Minutes Intravenous Every 12 hours PRN 06/11/15 1321     06/11/15 1319  vancomycin (VANCOCIN) 1,500 mg in sodium chloride 0.9 % 500 mL IVPB  Status:  Discontinued     1,500 mg 250 mL/hr over 120 Minutes Intravenous Every 12 hours PRN 06/11/15 1320 06/11/15 1321   06/10/15 1200  vancomycin (VANCOCIN) 1,250 mg in sodium chloride 0.9 % 250 mL IVPB  Status:  Discontinued     1,250 mg 166.7 mL/hr over 90 Minutes Intravenous Every 8 hours 06/10/15 0857 06/11/15 1312   06/10/15 0400  vancomycin (VANCOCIN) 1,500 mg in sodium chloride 0.9 % 500 mL IVPB  Status:  Discontinued     1,500 mg 250 mL/hr over 120 Minutes Intravenous Every 12 hours 06/09/15 1517 06/09/15 1716   06/10/15 0300  vancomycin (VANCOCIN) 1,500 mg in  sodium chloride 0.9 % 500 mL IVPB  Status:  Discontinued     1,500 mg 250 mL/hr over 120 Minutes Intravenous Every 12 hours 06/09/15 1801 06/10/15 0854   06/09/15 1545  vancomycin (VANCOCIN) 2,000 mg in sodium chloride 0.9 % 500 mL IVPB  Status:  Discontinued     2,000 mg 250 mL/hr over 120 Minutes Intravenous  Once 06/09/15 1532 06/09/15 1535   06/09/15 1545  vancomycin (VANCOCIN) 1,500 mg in sodium chloride 0.9 % 500 mL IVPB     1,500 mg 250 mL/hr over 120 Minutes Intravenous  Once 06/09/15 1541 06/09/15 2332   06/09/15 1515  vancomycin (VANCOCIN) 2,500 mg in sodium chloride 0.9 % 500 mL IVPB  Status:  Discontinued     2,500 mg 250 mL/hr over 120 Minutes Intravenous  Once 06/09/15 1511 06/09/15 1531   06/09/15 1515   vancomycin (VANCOCIN) 1,500 mg in sodium chloride 0.9 % 500 mL IVPB  Status:  Discontinued     1,500 mg 250 mL/hr over 120 Minutes Intravenous Every 12 hours 06/09/15 1511 06/09/15 1517   06/09/15 1515  piperacillin-tazobactam (ZOSYN) IVPB 3.375 g     3.375 g 12.5 mL/hr over 240 Minutes Intravenous 3 times per day 06/09/15 1511     06/09/15 1315  vancomycin (VANCOCIN) IVPB 1000 mg/200 mL premix  Status:  Discontinued     1,000 mg 200 mL/hr over 60 Minutes Intravenous Every 24 hours 06/09/15 1306 06/09/15 1511   06/09/15 1315  piperacillin-tazobactam (ZOSYN) IVPB 3.375 g  Status:  Discontinued     3.375 g 100 mL/hr over 30 Minutes Intravenous 4 times per day 06/09/15 1306 06/09/15 1511      Assessment: 59 yo patient with PMH of diabetes was admitted with gangrene and osteomyelitis of the left great toe. Patient scheduled for amputation today. Pharmacy has been consulted for Vancomycin dosing and monitoring. Patient originally started on vancomycin  q12 on 9/19. Patients CrCl and Scr improved to 97 ml/min and Scr 1.22.    Vancomycin trough level resulted @ 25 mcg/ml and was drawn correctly.   Goal of Therapy:  Vancomycin trough level 15-20 mcg/ml  Plan:  Will change patients regimen to vancomycin  q8 hours due to CrCl improvement. Trough ordered for 9/21 at 1130 and Scr ordered for 0500. Pharmacy will monitor renal function and trough closely and make adjustments as needed.   9/21: Will order random level to be drawn ~12 hours post dose @ 1600 to evaulate dosing strategy and when to redose. If level within goal range, would start Vancomycin 1250 mg IV q12 hours.  Demetrius Charity, PharmD

## 2015-06-11 NOTE — Clinical Documentation Improvement (Addendum)
Hospitalist      Please provide clinical indicators, historical, or baseline comparative data to support the documented diagnosis of "ATN". Or If the diagnosis of "ATN" is not applicable to this admission, please amend your documentation as appropriate.  Clinical Information: "#3 acute renal failure likely ATN due to sepsis continue IV fluids. Hold the hydrochlorothiazide" is documented in the H&P and has not been documented in subsequent progress notes.  BUN/Cr./GFR trend this admission   (no race available in [facility]) Component     Latest Ref Rng 06/09/2015 06/10/2015 06/11/2015  BUN     6 - 20 mg/dL 32 (H) 25 (H)   Creatinine     0.61 - 1.24 mg/dL 1.51 (H) 1.22 1.20  EGFR (African American)     >60 mL/min 56 (L) >60 >60  EGFR (Non-African Amer.)     >60 mL/min 48 (L) >60 >60   (please document your response in the progress notes and not on the query form itself.)   Please exercise your independent, professional judgment when responding. A specific answer is not anticipated or expected.   Thank You, Erling Conte  RN BSN CCDS [phone] Health Information Management [place]

## 2015-06-11 NOTE — Progress Notes (Signed)
Spoke to Dr. Winona Legato via phone. Order for Infectious Disease consult for gram negative rods growing from wound

## 2015-06-11 NOTE — Progress Notes (Signed)
1 Day Post-Op  Subjective: Patient seen. Doing well. Denies any pain with his foot.  Objective: Vital signs in last 24 hours: Temp:  [98 F (36.7 C)-100.2 F (37.9 C)] 98 F (36.7 C) (09/21 0706) Pulse Rate:  [59-127] 67 (09/21 0706) Resp:  [18-19] 18 (09/21 0706) BP: (131-152)/(54-77) 145/72 mmHg (09/21 0706) SpO2:  [93 %-100 %] 93 % (09/21 0706) FiO2 (%):  [21 %] 21 % (09/20 1424) Last BM Date: 06/08/15  Intake/Output from previous day: 09/20 0701 - 09/21 0700 In: 1941.3 [P.O.:240; I.V.:1401.3; IV Piggyback:300] Out: 2000 [Urine:2000] Intake/Output this shift:    The bandages dry and intact. No evidence of any bleeding through the bandage.  Lab Results:   Recent Labs  06/09/15 1311 06/10/15 0424  WBC 12.9* 11.2*  HGB 12.9* 12.1*  HCT 38.5* 35.4*  PLT 290 239   BMET  Recent Labs  06/09/15 1311 06/10/15 0424 06/11/15 0418  NA 130* 133*  --   K 4.0 3.9  --   CL 97* 102  --   CO2 22 25  --   GLUCOSE 222* 211*  --   BUN 32* 25*  --   CREATININE 1.51* 1.22 1.20  CALCIUM 8.8* 8.4*  --    PT/INR No results for input(s): LABPROT, INR in the last 72 hours. ABG No results for input(s): PHART, HCO3 in the last 72 hours.  Invalid input(s): PCO2, PO2  Studies/Results: Dg Foot Complete Left  06/09/2015   CLINICAL DATA:  Left foot cellulitis.  EXAM: LEFT FOOT - COMPLETE 3+ VIEW  COMPARISON:  None.  FINDINGS: Lytic destruction is seen involving the first distal phalanx consistent with osteomyelitis. The degenerative changes seen involving several tarsometatarsal joints. Vascular calcifications are noted.  IMPRESSION: Lytic destruction of the first distal phalanx consistent with osteomyelitis.   Electronically Signed   By: Lupita Raider, M.D.   On: 06/09/2015 14:04    Anti-infectives: Anti-infectives    Start     Dose/Rate Route Frequency Ordered Stop   06/10/15 1200  vancomycin (VANCOCIN) 1,250 mg in sodium chloride 0.9 % 250 mL IVPB     1,250 mg 166.7 mL/hr  over 90 Minutes Intravenous Every 8 hours 06/10/15 0857     06/10/15 0400  vancomycin (VANCOCIN) 1,500 mg in sodium chloride 0.9 % 500 mL IVPB  Status:  Discontinued     1,500 mg 250 mL/hr over 120 Minutes Intravenous Every 12 hours 06/09/15 1517 06/09/15 1716   06/10/15 0300  vancomycin (VANCOCIN) 1,500 mg in sodium chloride 0.9 % 500 mL IVPB  Status:  Discontinued     1,500 mg 250 mL/hr over 120 Minutes Intravenous Every 12 hours 06/09/15 1801 06/10/15 0854   06/09/15 1545  vancomycin (VANCOCIN) 2,000 mg in sodium chloride 0.9 % 500 mL IVPB  Status:  Discontinued     2,000 mg 250 mL/hr over 120 Minutes Intravenous  Once 06/09/15 1532 06/09/15 1535   06/09/15 1545  vancomycin (VANCOCIN) 1,500 mg in sodium chloride 0.9 % 500 mL IVPB     1,500 mg 250 mL/hr over 120 Minutes Intravenous  Once 06/09/15 1541 06/09/15 2332   06/09/15 1515  vancomycin (VANCOCIN) 2,500 mg in sodium chloride 0.9 % 500 mL IVPB  Status:  Discontinued     2,500 mg 250 mL/hr over 120 Minutes Intravenous  Once 06/09/15 1511 06/09/15 1531   06/09/15 1515  vancomycin (VANCOCIN) 1,500 mg in sodium chloride 0.9 % 500 mL IVPB  Status:  Discontinued     1,500  mg 250 mL/hr over 120 Minutes Intravenous Every 12 hours 06/09/15 1511 06/09/15 1517   06/09/15 1515  piperacillin-tazobactam (ZOSYN) IVPB 3.375 g     3.375 g 12.5 mL/hr over 240 Minutes Intravenous 3 times per day 06/09/15 1511     06/09/15 1315  vancomycin (VANCOCIN) IVPB 1000 mg/200 mL premix  Status:  Discontinued     1,000 mg 200 mL/hr over 60 Minutes Intravenous Every 24 hours 06/09/15 1306 06/09/15 1511   06/09/15 1315  piperacillin-tazobactam (ZOSYN) IVPB 3.375 g  Status:  Discontinued     3.375 g 100 mL/hr over 30 Minutes Intravenous 4 times per day 06/09/15 1306 06/09/15 1511      Assessment/Plan: s/p Procedure(s): Left great toe amputation  (Left) Plan: Bandage left in place today. We will reorder a CBC for tomorrow for evaluation and reassess the wound  tomorrow. Continue on antibiotics.  LOS: 2 days    CLINE,TODD W. 06/11/2015

## 2015-06-11 NOTE — Care Management (Signed)
POD 1- cultures pending. RNCM monitor for PICC and home IV ABX needs.

## 2015-06-11 NOTE — Progress Notes (Signed)
ANTIBIOTIC CONSULT NOTE - FOLLOW UP  Pharmacy Consult for vancomycin   Indication: osteomyelitis   No Known Allergies  Patient Measurements: Height:  (190.5 cm) Weight: (!) 315 lb (142.883 kg) IBW/kg (Calculated) : 84.5 Adjusted Body Weight: 107.86  Vital Signs: Temp: 99.1 F (37.3 C) (09/21 1516) Temp Source: Oral (09/21 1516) BP: 141/59 mmHg (09/21 1718) Pulse Rate: 80 (09/21 1718) Intake/Output from previous day: 09/20 0701 - 09/21 0700 In: 1941.3 [P.O.:240; I.V.:1401.3; IV Piggyback:300] Out: 2000 [Urine:2000] Intake/Output from this shift: Total I/O In: 720 [P.O.:720] Out: 550 [Urine:550]  Labs:  Recent Labs  06/09/15 1311 06/10/15 0424 06/11/15 0418  WBC 12.9* 11.2*  --   HGB 12.9* 12.1*  --   PLT 290 239  --   CREATININE 1.51* 1.22 1.20   Estimated Creatinine Clearance: 98.7 mL/min (by C-G formula based on Cr of 1.2).  Recent Labs  06/11/15 1112 06/11/15 1553  VANCOTROUGH 25*  --   VANCORANDOM  --  17     Microbiology: Recent Results (from the past 720 hour(s))  Culture, blood (routine x 2)     Status: None (Preliminary result)   Collection Time: 06/09/15  1:11 PM  Result Value Ref Range Status   Specimen Description BLOOD LEFT ARM  Final   Special Requests BOTTLES DRAWN AEROBIC AND ANAEROBIC 10CC  Final   Culture NO GROWTH 2 DAYS  Final   Report Status PENDING  Incomplete  Culture, blood (routine x 2)     Status: None (Preliminary result)   Collection Time: 06/09/15  1:11 PM  Result Value Ref Range Status   Specimen Description BLOOD LEFT HAND  Final   Special Requests BOTTLES DRAWN AEROBIC AND ANAEROBIC 10CC  Final   Culture NO GROWTH 2 DAYS  Final   Report Status PENDING  Incomplete  Tissue culture     Status: None (Preliminary result)   Collection Time: 06/10/15  1:22 PM  Result Value Ref Range Status   Specimen Description BONE  Final   Special Requests BONE  Final   Gram Stain PENDING  Incomplete   Culture   Final    MODERATE  GROWTH GRAM NEGATIVE RODS IDENTIFICATION AND SUSCEPTIBILITIES TO FOLLOW    Report Status PENDING  Incomplete    Anti-infectives    Start     Dose/Rate Route Frequency Ordered Stop   06/11/15 1700  vancomycin (VANCOCIN) 1,500 mg in sodium chloride 0.9 % 500 mL IVPB  Status:  Discontinued     1,500 mg 250 mL/hr over 120 Minutes Intravenous Every 12 hours 06/11/15 1319 06/11/15 1320   06/11/15 1321  vancomycin (VANCOCIN) 1,250 mg in sodium chloride 0.9 % 250 mL IVPB     1,250 mg 166.7 mL/hr over 90 Minutes Intravenous Every 12 hours PRN 06/11/15 1321     06/11/15 1319  vancomycin (VANCOCIN) 1,500 mg in sodium chloride 0.9 % 500 mL IVPB  Status:  Discontinued     1,500 mg 250 mL/hr over 120 Minutes Intravenous Every 12 hours PRN 06/11/15 1320 06/11/15 1321   06/10/15 1200  vancomycin (VANCOCIN) 1,250 mg in sodium chloride 0.9 % 250 mL IVPB  Status:  Discontinued     1,250 mg 166.7 mL/hr over 90 Minutes Intravenous Every 8 hours 06/10/15 0857 06/11/15 1312   06/10/15 0400  vancomycin (VANCOCIN) 1,500 mg in sodium chloride 0.9 % 500 mL IVPB  Status:  Discontinued     1,500 mg 250 mL/hr over 120 Minutes Intravenous Every 12 hours 06/09/15 1517 06/09/15  1716   06/10/15 0300  vancomycin (VANCOCIN) 1,500 mg in sodium chloride 0.9 % 500 mL IVPB  Status:  Discontinued     1,500 mg 250 mL/hr over 120 Minutes Intravenous Every 12 hours 06/09/15 1801 06/10/15 0854   06/09/15 1545  vancomycin (VANCOCIN) 2,000 mg in sodium chloride 0.9 % 500 mL IVPB  Status:  Discontinued     2,000 mg 250 mL/hr over 120 Minutes Intravenous  Once 06/09/15 1532 06/09/15 1535   06/09/15 1545  vancomycin (VANCOCIN) 1,500 mg in sodium chloride 0.9 % 500 mL IVPB     1,500 mg 250 mL/hr over 120 Minutes Intravenous  Once 06/09/15 1541 06/09/15 2332   06/09/15 1515  vancomycin (VANCOCIN) 2,500 mg in sodium chloride 0.9 % 500 mL IVPB  Status:  Discontinued     2,500 mg 250 mL/hr over 120 Minutes Intravenous  Once 06/09/15  1511 06/09/15 1531   06/09/15 1515  vancomycin (VANCOCIN) 1,500 mg in sodium chloride 0.9 % 500 mL IVPB  Status:  Discontinued     1,500 mg 250 mL/hr over 120 Minutes Intravenous Every 12 hours 06/09/15 1511 06/09/15 1517   06/09/15 1515  piperacillin-tazobactam (ZOSYN) IVPB 3.375 g     3.375 g 12.5 mL/hr over 240 Minutes Intravenous 3 times per day 06/09/15 1511     06/09/15 1315  vancomycin (VANCOCIN) IVPB 1000 mg/200 mL premix  Status:  Discontinued     1,000 mg 200 mL/hr over 60 Minutes Intravenous Every 24 hours 06/09/15 1306 06/09/15 1511   06/09/15 1315  piperacillin-tazobactam (ZOSYN) IVPB 3.375 g  Status:  Discontinued     3.375 g 100 mL/hr over 30 Minutes Intravenous 4 times per day 06/09/15 1306 06/09/15 1511      Assessment: 59 yo patient with PMH of diabetes was admitted with gangrene and osteomyelitis of the left great toe. Patient scheduled for amputation today. Pharmacy has been consulted for Vancomycin dosing and monitoring. Patient originally started on vancomycin  q12 on 9/19. Patients CrCl and Scr improved to 97 ml/min and Scr 1.22.    Vancomycin trough level resulted @ 25 mcg/ml and was drawn correctly.   Goal of Therapy:  Vancomycin trough level 15-20 mcg/ml  Plan:  Pt originally started on vancomycin  IV Q12H on 9/19.  Dose increased to  IV Q8H d/t improvement in renal function.  Level on 9/21  was supratherapeutic at 25.   9/21:  Follow-up random trough at 17 ~12 hours post dose. Ke based on levels is 0.077; T1/2 of 9 hours. Based on new Ke will restart vancomycin at  IV Q12H.  Trough prior to 4th dose.  Maryjo Rochester, PharmD Clinical Pharmacist 06/11/2015

## 2015-06-11 NOTE — Progress Notes (Signed)
Patient ID: Raymond Avery, male   DOB: Apr 30, 1956, 59 y.o.   MRN: [id number] [facility] PROGRESS NOTE  HPI/Subjective: Patient presented to the hospital with left great toe gangrene underwent a left great toe amputation by Dr. Alberteen Spindle on 06/10/2015. Feels relatively good now. Denies any significant pain. Good bleeding during operation. Remains on vancomycin and Zosyn, wound cultures are pending and blood cultures are negative so far. Patient is afebrile.    Objective: Filed Vitals:   06/11/15 0706  BP: 145/72  Pulse: 67  Temp: 98 F (36.7 C)  Resp: 18    Filed Weights   06/09/15 1509  Weight: 142.883 kg (315 lb)    ROS: Review of Systems  Constitutional: Positive for fever. Negative for chills.  Eyes: Negative for blurred vision.  Respiratory: Negative for cough and shortness of breath.   Cardiovascular: Negative for chest pain.  Gastrointestinal: Negative for nausea, vomiting, abdominal pain, diarrhea and constipation.  Genitourinary: Negative for dysuria.  Musculoskeletal: Negative for joint pain.  Neurological: Negative for dizziness and headaches.   Exam: Physical Exam  Constitutional: He is oriented to person, place, and time.  HENT:  Nose: No mucosal edema.  Mouth/Throat: No oropharyngeal exudate or posterior oropharyngeal edema.  Eyes: Conjunctivae, EOM and lids are normal. Pupils are equal, round, and reactive to light.  Neck: No JVD present. Carotid bruit is not present. No edema present. No thyroid mass and no thyromegaly present.  Cardiovascular: S1 normal and S2 normal.  Exam reveals no gallop.   No murmur heard. Pulses:      Dorsalis pedis pulses are 2+ on the right side, and 2+ on the left side.  Respiratory: No respiratory distress. He has no wheezes. He has no rhonchi. He has no rales.  GI: Soft. Bowel sounds are normal. There is no tenderness.  Musculoskeletal:       Right ankle: He exhibits swelling.       Left ankle: He exhibits  swelling.  Lymphadenopathy:    He has no cervical adenopathy.  Neurological: He is alert and oriented to person, place, and time. No cranial nerve deficit.  Skin: Skin is warm. No rash noted. Nails show no clubbing.  Left first toe very swollen and covered by bandage.  Psychiatric: He has a normal mood and affect.    Data Reviewed: Basic Metabolic Panel:  Recent Labs Lab 06/09/15 1311 06/10/15 0424 06/11/15 0418  NA 130* 133*  --   K 4.0 3.9  --   CL 97* 102  --   CO2 22 25  --   GLUCOSE 222* 211*  --   BUN 32* 25*  --   CREATININE 1.51* 1.22 1.20  CALCIUM 8.8* 8.4*  --    Liver Function Tests:  Recent Labs Lab 06/09/15 1311  AST 23  ALT 27  ALKPHOS 66  BILITOT 0.8  PROT 7.9  ALBUMIN 3.4*   CBC:  Recent Labs Lab 06/09/15 1311 06/10/15 0424  WBC 12.9* 11.2*  NEUTROABS 9.8*  --   HGB 12.9* 12.1*  HCT 38.5* 35.4*  MCV 86.4 87.1  PLT 290 239   CBG:  Recent Labs Lab 06/10/15 1130 06/10/15 1404 06/10/15 1624 06/10/15 2103 06/11/15 1111  GLUCAP 172* 165* 212* 190* 217*    Recent Results (from the past 240 hour(s))  Culture, blood (routine x 2)     Status: None (Preliminary result)   Collection Time: 06/09/15  1:11 PM  Result Value Ref Range Status   Specimen Description BLOOD  LEFT ARM  Final   Special Requests BOTTLES DRAWN AEROBIC AND ANAEROBIC 10CC  Final   Culture NO GROWTH < 24 HOURS  Final   Report Status PENDING  Incomplete  Culture, blood (routine x 2)     Status: None (Preliminary result)   Collection Time: 06/09/15  1:11 PM  Result Value Ref Range Status   Specimen Description BLOOD LEFT HAND  Final   Special Requests BOTTLES DRAWN AEROBIC AND ANAEROBIC 10CC  Final   Culture NO GROWTH < 24 HOURS  Final   Report Status PENDING  Incomplete  Tissue culture     Status: None (Preliminary result)   Collection Time: 06/10/15  1:22 PM  Result Value Ref Range Status   Specimen Description BONE  Final   Special Requests BONE  Final   Gram  Stain PENDING  Incomplete   Culture   Final    MODERATE GROWTH GRAM NEGATIVE RODS IDENTIFICATION AND SUSCEPTIBILITIES TO FOLLOW    Report Status PENDING  Incomplete     Studies: Dg Foot Complete Left  06/09/2015   CLINICAL DATA:  Left foot cellulitis.  EXAM: LEFT FOOT - COMPLETE 3+ VIEW  COMPARISON:  None.  FINDINGS: Lytic destruction is seen involving the first distal phalanx consistent with osteomyelitis. The degenerative changes seen involving several tarsometatarsal joints. Vascular calcifications are noted.  IMPRESSION: Lytic destruction of the first distal phalanx consistent with osteomyelitis.   Electronically Signed   By: Lupita Raider, M.D.   On: 06/09/2015 14:04    Scheduled Meds: . enalapril  10 mg Oral BID  . hydrALAZINE  100 mg Oral BID  . Influenza vac split quadrivalent PF  0.5 mL Intramuscular Tomorrow-1000  . insulin aspart  0-15 Units Subcutaneous TID WC  . insulin aspart  0-5 Units Subcutaneous QHS  . metoprolol succinate  25 mg Oral Daily  . piperacillin-tazobactam (ZOSYN)  IV  3.375 g Intravenous 3 times per day  . sodium chloride  3 mL Intravenous Q12H  . vancomycin  1,250 mg Intravenous Q8H   Continuous Infusions:    Assessment/Plan: 1. Clinical sepsis due to osteomyelitis of the left first toe. Awaiting for wound cultures, moderate growth of gram-negative rods was noted , ID to follow.  Continue vancomycin and Zosyn.  2. Acute Kidney injury- improving with fluids. 3. Essential HTN- continue medications, blood pressure is relatively well controlled 4. Type 2 Diabetes with neuropathy- resume diabetic meds, continue sliding scale insulin 5. Hx afib- holding xarelto, resumed per podiatrist's recommendation 6. Peripheral vascular disease. Patient is to follow-up with Dr. Wyn Quaker as outpatient for arterial duplex  Code Status:     Code Status Orders        Start     Ordered   06/09/15 1302  Full code   Continuous     06/09/15 1306     Disposition Plan:  watch post op for a few days  Consultants:  Podiatry, vascular surgery  Time spent: 35 minutes  [facility]  [facility]

## 2015-06-11 NOTE — Progress Notes (Addendum)
POD 1/ Pt. Alert and oriented. VSS. No c/o pain. Continues to receive IV antibiotics. Tolerating PO's. Using urinal. Resting quietly.

## 2015-06-11 NOTE — Progress Notes (Addendum)
Inpatient Diabetes Program Recommendations  AACE/ADA: New Consensus Statement on Inpatient Glycemic Control (2015)  Target Ranges:  Prepandial:   less than 140 mg/dL      Peak postprandial:   less than 180 mg/dL (1-2 hours)      Critically ill patients:  140 - 180 mg/dL   Results for DEMETRIE, BORGE (MRN [id number]) as of 06/11/2015 09:19  Ref. Range 06/10/2015 07:19 06/10/2015 11:30 06/10/2015 14:04 06/10/2015 16:24 06/10/2015 21:03  Glucose-Capillary Latest Ref Range: 65-99 mg/dL 782 (H) 956 (H) 213 (H) 212 (H) 190 (H)    Diabetes history: Type 2  Outpatient Diabetes medications: Levemir 30 units qhs            Glipizide  bid  Current DM Orders: Novolog Moderate SSI (0-15 units) TID AC + HS    Inpatient Diabetes Program Recommendations:  1. Consider obtaining an A1C to determine blood sugar control at home 2. Consider starting 1/2 home dose of basal insulin, Levemir 15 units qhs   Will follow Jeannine Tawanna Sat RN, MSN, CDE Diabetes Coordinator Inpatient Glycemic Control Team Team Pager: [phone] (8a-5p)

## 2015-06-12 LAB — CBC WITH DIFFERENTIAL/PLATELET
BASOS ABS: 0.1 10*3/uL (ref 0–0.1)
BASOS PCT: 1 %
EOS ABS: 0.6 10*3/uL (ref 0–0.7)
EOS PCT: 5 %
HCT: 33 % — ABNORMAL LOW (ref 40.0–52.0)
HEMOGLOBIN: 11.1 g/dL — AB (ref 13.0–18.0)
LYMPHS ABS: 1.3 10*3/uL (ref 1.0–3.6)
Lymphocytes Relative: 11 %
MCH: 29.1 pg (ref 26.0–34.0)
MCHC: 33.6 g/dL (ref 32.0–36.0)
MCV: 86.6 fL (ref 80.0–100.0)
Monocytes Absolute: 1.3 10*3/uL — ABNORMAL HIGH (ref 0.2–1.0)
Monocytes Relative: 11 %
NEUTROS PCT: 72 %
Neutro Abs: 8.4 10*3/uL — ABNORMAL HIGH (ref 1.4–6.5)
PLATELETS: 298 10*3/uL (ref 150–440)
RBC: 3.81 MIL/uL — AB (ref 4.40–5.90)
RDW: 13.6 % (ref 11.5–14.5)
WBC: 11.7 10*3/uL — AB (ref 3.8–10.6)

## 2015-06-12 LAB — GLUCOSE, CAPILLARY
GLUCOSE-CAPILLARY: 132 mg/dL — AB (ref 65–99)
Glucose-Capillary: 156 mg/dL — ABNORMAL HIGH (ref 65–99)
Glucose-Capillary: 194 mg/dL — ABNORMAL HIGH (ref 65–99)

## 2015-06-12 LAB — URIC ACID: URIC ACID, SERUM: 5.4 mg/dL (ref 4.4–7.6)

## 2015-06-12 LAB — CREATININE, SERUM: CREATININE: 1.16 mg/dL (ref 0.61–1.24)

## 2015-06-12 NOTE — Progress Notes (Signed)
ANTIBIOTIC CONSULT NOTE - FOLLOW UP  Pharmacy Consult for vancomycin   Indication: osteomyelitis   No Known Allergies  Patient Measurements: Height:  (190.5 cm) Weight: (!) 315 lb (142.883 kg) IBW/kg (Calculated) : 84.5 Adjusted Body Weight: 107.86  Vital Signs: Temp: 98.2 F (36.8 C) (09/22 0713) Temp Source: Oral (09/22 0713) BP: 146/62 mmHg (09/22 0713) Pulse Rate: 71 (09/22 0713) Intake/Output from previous day: 09/21 0701 - 09/22 0700 In: 1370 [P.O.:720; IV Piggyback:650] Out: 2050 [Urine:2050] Intake/Output from this shift:    Labs:  Recent Labs  06/09/15 1311 06/10/15 0424 06/11/15 0418 06/12/15 0518  WBC 12.9* 11.2*  --  11.7*  HGB 12.9* 12.1*  --  11.1*  PLT 290 239  --  298  CREATININE 1.51* 1.22 1.20 1.16   Estimated Creatinine Clearance: 102.1 mL/min (by C-G formula based on Cr of 1.16).  Recent Labs  06/11/15 1112 06/11/15 1553  VANCOTROUGH 25*  --   VANCORANDOM  --  17     Microbiology: Recent Results (from the past 720 hour(s))  Culture, blood (routine x 2)     Status: None (Preliminary result)   Collection Time: 06/09/15  1:11 PM  Result Value Ref Range Status   Specimen Description BLOOD LEFT ARM  Final   Special Requests BOTTLES DRAWN AEROBIC AND ANAEROBIC 10CC  Final   Culture NO GROWTH 2 DAYS  Final   Report Status PENDING  Incomplete  Culture, blood (routine x 2)     Status: None (Preliminary result)   Collection Time: 06/09/15  1:11 PM  Result Value Ref Range Status   Specimen Description BLOOD LEFT HAND  Final   Special Requests BOTTLES DRAWN AEROBIC AND ANAEROBIC 10CC  Final   Culture NO GROWTH 2 DAYS  Final   Report Status PENDING  Incomplete  Tissue culture     Status: None (Preliminary result)   Collection Time: 06/10/15  1:22 PM  Result Value Ref Range Status   Specimen Description BONE  Final   Special Requests BONE  Final   Gram Stain PENDING  Incomplete   Culture   Final    MODERATE GROWTH GRAM NEGATIVE  RODS IDENTIFICATION AND SUSCEPTIBILITIES TO FOLLOW    Report Status PENDING  Incomplete    Anti-infectives    Start     Dose/Rate Route Frequency Ordered Stop   06/11/15 1930  vancomycin (VANCOCIN) 1,500 mg in sodium chloride 0.9 % 500 mL IVPB     1,500 mg 250 mL/hr over 120 Minutes Intravenous Every 12 hours 06/11/15 1859     06/11/15 1700  vancomycin (VANCOCIN) 1,500 mg in sodium chloride 0.9 % 500 mL IVPB  Status:  Discontinued     1,500 mg 250 mL/hr over 120 Minutes Intravenous Every 12 hours 06/11/15 1319 06/11/15 1320   06/11/15 1321  vancomycin (VANCOCIN) 1,250 mg in sodium chloride 0.9 % 250 mL IVPB  Status:  Discontinued     1,250 mg 166.7 mL/hr over 90 Minutes Intravenous Every 12 hours PRN 06/11/15 1321 06/11/15 1859   06/11/15 1319  vancomycin (VANCOCIN) 1,500 mg in sodium chloride 0.9 % 500 mL IVPB  Status:  Discontinued     1,500 mg 250 mL/hr over 120 Minutes Intravenous Every 12 hours PRN 06/11/15 1320 06/11/15 1321   06/10/15 1200  vancomycin (VANCOCIN) 1,250 mg in sodium chloride 0.9 % 250 mL IVPB  Status:  Discontinued     1,250 mg 166.7 mL/hr over 90 Minutes Intravenous Every 8 hours 06/10/15 0857 06/11/15 1312  06/10/15 0400  vancomycin (VANCOCIN) 1,500 mg in sodium chloride 0.9 % 500 mL IVPB  Status:  Discontinued     1,500 mg 250 mL/hr over 120 Minutes Intravenous Every 12 hours 06/09/15 1517 06/09/15 1716   06/10/15 0300  vancomycin (VANCOCIN) 1,500 mg in sodium chloride 0.9 % 500 mL IVPB  Status:  Discontinued     1,500 mg 250 mL/hr over 120 Minutes Intravenous Every 12 hours 06/09/15 1801 06/10/15 0854   06/09/15 1545  vancomycin (VANCOCIN) 2,000 mg in sodium chloride 0.9 % 500 mL IVPB  Status:  Discontinued     2,000 mg 250 mL/hr over 120 Minutes Intravenous  Once 06/09/15 1532 06/09/15 1535   06/09/15 1545  vancomycin (VANCOCIN) 1,500 mg in sodium chloride 0.9 % 500 mL IVPB     1,500 mg 250 mL/hr over 120 Minutes Intravenous  Once 06/09/15 1541 06/09/15  2332   06/09/15 1515  vancomycin (VANCOCIN) 2,500 mg in sodium chloride 0.9 % 500 mL IVPB  Status:  Discontinued     2,500 mg 250 mL/hr over 120 Minutes Intravenous  Once 06/09/15 1511 06/09/15 1531   06/09/15 1515  vancomycin (VANCOCIN) 1,500 mg in sodium chloride 0.9 % 500 mL IVPB  Status:  Discontinued     1,500 mg 250 mL/hr over 120 Minutes Intravenous Every 12 hours 06/09/15 1511 06/09/15 1517   06/09/15 1515  piperacillin-tazobactam (ZOSYN) IVPB 3.375 g     3.375 g 12.5 mL/hr over 240 Minutes Intravenous 3 times per day 06/09/15 1511     06/09/15 1315  vancomycin (VANCOCIN) IVPB 1000 mg/200 mL premix  Status:  Discontinued     1,000 mg 200 mL/hr over 60 Minutes Intravenous Every 24 hours 06/09/15 1306 06/09/15 1511   06/09/15 1315  piperacillin-tazobactam (ZOSYN) IVPB 3.375 g  Status:  Discontinued     3.375 g 100 mL/hr over 30 Minutes Intravenous 4 times per day 06/09/15 1306 06/09/15 1511      Assessment: 59 yo patient with PMH of diabetes was admitted with gangrene and osteomyelitis of the left great toe. Patient scheduled for amputation today. Pharmacy has been consulted for Vancomycin dosing and monitoring. Patient also receiving Zosyn 3.375 q8h. Patients CrCl and Scr improved to 102.1 ml/min and Scr 1.16 today.     Goal of Therapy:  Vancomycin trough level 15-20 mcg/ml  Plan:  Pt originally started on vancomycin  IV Q12H on 9/19.  Dose increased to  IV Q8H d/t improvement in renal function.  Level on 9/21  was supratherapeutic at 25.   9/21:  Follow-up random trough at 17 ~12 hours post dose. Ke based on levels is 0.077; T1/2 of 9 hours. Based on new Ke will restart vancomycin at  IV Q12H.    Trough prior to 4th dose  on 06/13/15. Pharmacy will continue to follow renal function and labs and make adjustments as needed.    Cher Nakai, PharmD Pharmacy Resident

## 2015-06-12 NOTE — Progress Notes (Signed)
Patient ID: Raymond Avery, male   DOB: 01-21-1956, 59 y.o.   MRN: [id number] [facility] PROGRESS NOTE  HPI/Subjective: Patient presented to the hospital with left great toe gangrene underwent a left great toe amputation by Dr. Alberteen Spindle on 06/10/2015. Feels relatively good now. Denies any significant pain in the incision site, however, admits of having some left wrist discomfort, pain extending to forearm. Some bleeding during operation. Remains on vancomycin and Zosyn, wound cultures are growing moderate growth of Proteus mirabilis and heavy growth of gram-positive cocci, Proteus is sensitive to ampicillin, ceftazidime, ciprofloxacin, and trimethoprim/ sulfamethoxazole and blood cultures are negative so far. Patient is afebrile. ID consultation was requested. Wound looked a little dusky per Dr. Alberteen Spindle at the inferior aspect of the incision   Objective: Filed Vitals:   06/12/15 0713  BP: 146/62  Pulse: 71  Temp: 98.2 F (36.8 C)  Resp: 18    Filed Weights   06/09/15 1509  Weight: 142.883 kg (315 lb)    ROS: Review of Systems  Constitutional: Positive for fever. Negative for chills.  Eyes: Negative for blurred vision.  Respiratory: Negative for cough and shortness of breath.   Cardiovascular: Negative for chest pain.  Gastrointestinal: Negative for nausea, vomiting, abdominal pain, diarrhea and constipation.  Genitourinary: Negative for dysuria.  Musculoskeletal: Negative for joint pain.  Neurological: Negative for dizziness and headaches.   Exam: Physical Exam  Constitutional: He is oriented to person, place, and time.  HENT:  Nose: No mucosal edema.  Mouth/Throat: No oropharyngeal exudate or posterior oropharyngeal edema.  Eyes: Conjunctivae, EOM and lids are normal. Pupils are equal, round, and reactive to light.  Neck: No JVD present. Carotid bruit is not present. No edema present. No thyroid mass and no thyromegaly present.  Cardiovascular: S1 normal and S2  normal.  Exam reveals no gallop.   No murmur heard. Pulses:      Dorsalis pedis pulses are 2+ on the right side, and 2+ on the left side.  Respiratory: No respiratory distress. He has no wheezes. He has no rhonchi. He has no rales.  GI: Soft. Bowel sounds are normal. There is no tenderness.  Musculoskeletal:       Right ankle: He exhibits swelling.       Left ankle: He exhibits swelling.  Lymphadenopathy:    He has no cervical adenopathy.  Neurological: He is alert and oriented to person, place, and time. No cranial nerve deficit.  Skin: Skin is warm. No rash noted. Nails show no clubbing.  Left first toe very swollen and covered by bandage.  Psychiatric: He has a normal mood and affect.   Patient's dressing of the left foot was opened and wound looked somewhat dusky at the inferior aspect of the incision, though good coalescence of the incision sites. Moderate caked blood on the dressing, no active bleeding, though Data Reviewed: Basic Metabolic Panel:  Recent Labs Lab 06/09/15 1311 06/10/15 0424 06/11/15 0418 06/12/15 0518  NA 130* 133*  --   --   K 4.0 3.9  --   --   CL 97* 102  --   --   CO2 22 25  --   --   GLUCOSE 222* 211*  --   --   BUN 32* 25*  --   --   CREATININE 1.51* 1.22 1.20 1.16  CALCIUM 8.8* 8.4*  --   --    Liver Function Tests:  Recent Labs Lab 06/09/15 1311  AST 23  ALT 27  ALKPHOS 66  BILITOT 0.8  PROT 7.9  ALBUMIN 3.4*   CBC:  Recent Labs Lab 06/09/15 1311 06/10/15 0424 06/12/15 0518  WBC 12.9* 11.2* 11.7*  NEUTROABS 9.8*  --  8.4*  HGB 12.9* 12.1* 11.1*  HCT 38.5* 35.4* 33.0*  MCV 86.4 87.1 86.6  PLT 290 239 298   CBG:  Recent Labs Lab 06/10/15 2103 06/11/15 1111 06/11/15 1518 06/11/15 2102 06/12/15 0716  GLUCAP 190* 217* 186* 139* 156*    Recent Results (from the past 240 hour(s))  Culture, blood (routine x 2)     Status: None (Preliminary result)   Collection Time: 06/09/15  1:11 PM  Result Value Ref Range Status    Specimen Description BLOOD LEFT ARM  Final   Special Requests BOTTLES DRAWN AEROBIC AND ANAEROBIC 10CC  Final   Culture NO GROWTH 3 DAYS  Final   Report Status PENDING  Incomplete  Culture, blood (routine x 2)     Status: None (Preliminary result)   Collection Time: 06/09/15  1:11 PM  Result Value Ref Range Status   Specimen Description BLOOD LEFT HAND  Final   Special Requests BOTTLES DRAWN AEROBIC AND ANAEROBIC 10CC  Final   Culture NO GROWTH 3 DAYS  Final   Report Status PENDING  Incomplete  Tissue culture     Status: None (Preliminary result)   Collection Time: 06/10/15  1:22 PM  Result Value Ref Range Status   Specimen Description BONE  Final   Special Requests BONE  Final   Gram Stain   Final    MANY WBC SEEN MANY GRAM POSITIVE COCCI MANY GRAM NEGATIVE RODS    Culture   Final    MODERATE GROWTH PROTEUS MIRABILIS HEAVY GROWTH GRAM POSITIVE COCCI IDENTIFICATION AND SUSCEPTIBILITIES TO FOLLOW    Report Status PENDING  Incomplete   Organism ID, Bacteria PROTEUS MIRABILIS  Final      Susceptibility   Proteus mirabilis - MIC*    AMPICILLIN SENSITIVE Sensitive     CEFTAZIDIME SENSITIVE Sensitive     CIPROFLOXACIN SENSITIVE Sensitive     TRIMETH/SULFA SENSITIVE Sensitive     CEFAZOLIN RESISTANT Resistant     * MODERATE GROWTH PROTEUS MIRABILIS     Studies: No results found.  Scheduled Meds: . enalapril  10 mg Oral BID  . glipiZIDE  5 mg Oral BID AC  . hydrALAZINE  100 mg Oral BID  . Influenza vac split quadrivalent PF  0.5 mL Intramuscular Tomorrow-1000  . insulin aspart  0-15 Units Subcutaneous TID WC  . insulin aspart  0-5 Units Subcutaneous QHS  . metoprolol succinate  25 mg Oral Daily  . piperacillin-tazobactam (ZOSYN)  IV  3.375 g Intravenous 3 times per day  . sodium chloride  3 mL Intravenous Q12H  . vancomycin  1,500 mg Intravenous Q12H   Continuous Infusions:    Assessment/Plan: 1. Clinical sepsis due to osteomyelitis of the left first toe. Wound  culture is growing Proteus as well as gram-positive cocci, getting infectious disease consultation.   Continue vancomycin and Zosyn for now.  2. Acute Kidney injury- improved with fluids. 3. Essential HTN-, poor control. Systolic blood pressure remains between 150s to 160s, possibly related to stress as well as pain,  continue medications, advance them as needed 4. Type 2 Diabetes with neuropathy- resumed diabetic meds, continue sliding scale insulin, blood glucose levels are ranging between 140s to 220s. Patient apparently was not on insulin Lantus recently 5. Hx afib- holding xarelto, resume per podiatrist's recommendations,  patient may need to undergo repeated operative procedure 6. Peripheral vascular disease. Patient is to follow-up with Dr. Wyn Quaker as outpatient for arterial duplex 7. Left wrist pain, get uric acid level stat, initiate patient on colchicine or steroids depending on uric acid level  Code Status:     Code Status Orders        Start     Ordered   06/09/15 1302  Full code   Continuous     06/09/15 1306     Disposition Plan: watch post op for a few days  Consultants:  Podiatry DR Alberteen Spindle, vascular surgery  Time spent: 45 minutes  [facility]  [facility]

## 2015-06-12 NOTE — Progress Notes (Signed)
POD 1. Pt. Alert and oriented. VSS. No c./o pain. Up ambulating to bathroom. Receiving IV antibiotics. Dressing clean dry and intact. Rested quietly throughout the night. Will continue to monitor.

## 2015-06-12 NOTE — Progress Notes (Signed)
2 Days Post-Op  Subjective: Patient seen. Still no complaints of pain.  Objective: Vital signs in last 24 hours: Temp:  [98.1 F (36.7 C)-99.1 F (37.3 C)] 98.2 F (36.8 C) (09/22 0713) Pulse Rate:  [71-80] 71 (09/22 0713) Resp:  [18-20] 18 (09/22 0713) BP: (138-159)/(59-72) 146/62 mmHg (09/22 0713) SpO2:  [92 %-96 %] 92 % (09/22 0713) Last BM Date: 06/11/15  Intake/Output from previous day: 09/21 0701 - 09/22 0700 In: 1370 [P.O.:720; IV Piggyback:650] Out: 2050 [Urine:2050] Intake/Output this shift:    Mild to moderate dried blood on the bandage with an area of a more active bleeding along inferior aspect of the incision. The incision is well coapted but there is a mild dusky appearance along the inferior aspect of the incision. No purulence is expressed.  Lab Results:   Recent Labs  06/10/15 0424 06/12/15 0518  WBC 11.2* 11.7*  HGB 12.1* 11.1*  HCT 35.4* 33.0*  PLT 239 298   BMET  Recent Labs  06/09/15 1311 06/10/15 0424 06/11/15 0418 06/12/15 0518  NA 130* 133*  --   --   K 4.0 3.9  --   --   CL 97* 102  --   --   CO2 22 25  --   --   GLUCOSE 222* 211*  --   --   BUN 32* 25*  --   --   CREATININE 1.51* 1.22 1.20 1.16  CALCIUM 8.8* 8.4*  --   --    PT/INR No results for input(s): LABPROT, INR in the last 72 hours. ABG No results for input(s): PHART, HCO3 in the last 72 hours.  Invalid input(s): PCO2, PO2  Studies/Results: No results found.  Anti-infectives: Anti-infectives    Start     Dose/Rate Route Frequency Ordered Stop   06/11/15 1930  vancomycin (VANCOCIN) 1,500 mg in sodium chloride 0.9 % 500 mL IVPB     1,500 mg 250 mL/hr over 120 Minutes Intravenous Every 12 hours 06/11/15 1859     06/11/15 1700  vancomycin (VANCOCIN) 1,500 mg in sodium chloride 0.9 % 500 mL IVPB  Status:  Discontinued     1,500 mg 250 mL/hr over 120 Minutes Intravenous Every 12 hours 06/11/15 1319 06/11/15 1320   06/11/15 1321  vancomycin (VANCOCIN) 1,250 mg in  sodium chloride 0.9 % 250 mL IVPB  Status:  Discontinued     1,250 mg 166.7 mL/hr over 90 Minutes Intravenous Every 12 hours PRN 06/11/15 1321 06/11/15 1859   06/11/15 1319  vancomycin (VANCOCIN) 1,500 mg in sodium chloride 0.9 % 500 mL IVPB  Status:  Discontinued     1,500 mg 250 mL/hr over 120 Minutes Intravenous Every 12 hours PRN 06/11/15 1320 06/11/15 1321   06/10/15 1200  vancomycin (VANCOCIN) 1,250 mg in sodium chloride 0.9 % 250 mL IVPB  Status:  Discontinued     1,250 mg 166.7 mL/hr over 90 Minutes Intravenous Every 8 hours 06/10/15 0857 06/11/15 1312   06/10/15 0400  vancomycin (VANCOCIN) 1,500 mg in sodium chloride 0.9 % 500 mL IVPB  Status:  Discontinued     1,500 mg 250 mL/hr over 120 Minutes Intravenous Every 12 hours 06/09/15 1517 06/09/15 1716   06/10/15 0300  vancomycin (VANCOCIN) 1,500 mg in sodium chloride 0.9 % 500 mL IVPB  Status:  Discontinued     1,500 mg 250 mL/hr over 120 Minutes Intravenous Every 12 hours 06/09/15 1801 06/10/15 0854   06/09/15 1545  vancomycin (VANCOCIN) 2,000 mg in sodium chloride 0.9 %  500 mL IVPB  Status:  Discontinued     2,000 mg 250 mL/hr over 120 Minutes Intravenous  Once 06/09/15 1532 06/09/15 1535   06/09/15 1545  vancomycin (VANCOCIN) 1,500 mg in sodium chloride 0.9 % 500 mL IVPB     1,500 mg 250 mL/hr over 120 Minutes Intravenous  Once 06/09/15 1541 06/09/15 2332   06/09/15 1515  vancomycin (VANCOCIN) 2,500 mg in sodium chloride 0.9 % 500 mL IVPB  Status:  Discontinued     2,500 mg 250 mL/hr over 120 Minutes Intravenous  Once 06/09/15 1511 06/09/15 1531   06/09/15 1515  vancomycin (VANCOCIN) 1,500 mg in sodium chloride 0.9 % 500 mL IVPB  Status:  Discontinued     1,500 mg 250 mL/hr over 120 Minutes Intravenous Every 12 hours 06/09/15 1511 06/09/15 1517   06/09/15 1515  piperacillin-tazobactam (ZOSYN) IVPB 3.375 g     3.375 g 12.5 mL/hr over 240 Minutes Intravenous 3 times per day 06/09/15 1511     06/09/15 1315  vancomycin (VANCOCIN)  IVPB 1000 mg/200 mL premix  Status:  Discontinued     1,000 mg 200 mL/hr over 60 Minutes Intravenous Every 24 hours 06/09/15 1306 06/09/15 1511   06/09/15 1315  piperacillin-tazobactam (ZOSYN) IVPB 3.375 g  Status:  Discontinued     3.375 g 100 mL/hr over 30 Minutes Intravenous 4 times per day 06/09/15 1306 06/09/15 1511      Assessment/Plan: s/p Procedure(s): Left great toe amputation  (Left) Assessment: Osteomyelitis with gangrene status post amputation left great toe   Plan: Betadine applied to the incision followed by a sterile bandage. Continue on antibiotics. Waiting for culture results return. Assess the wound daily.  LOS: 3 days    CLINE,TODD W. 06/12/2015

## 2015-06-12 NOTE — Consult Note (Cosign Needed)
E-  ID consult note. Reviewed chart and labs, imaging. Has been on vanc and zosyn.   Cultures below - growing GPC and proteus  Results for orders placed or performed during the hospital encounter of 06/09/15  Culture, blood (routine x 2)     Status: None (Preliminary result)   Collection Time: 06/09/15  1:11 PM  Result Value Ref Range Status   Specimen Description BLOOD LEFT ARM  Final   Special Requests BOTTLES DRAWN AEROBIC AND ANAEROBIC 10CC  Final   Culture NO GROWTH 3 DAYS  Final   Report Status PENDING  Incomplete  Culture, blood (routine x 2)     Status: None (Preliminary result)   Collection Time: 06/09/15  1:11 PM  Result Value Ref Range Status   Specimen Description BLOOD LEFT HAND  Final   Special Requests BOTTLES DRAWN AEROBIC AND ANAEROBIC 10CC  Final   Culture NO GROWTH 3 DAYS  Final   Report Status PENDING  Incomplete  Anaerobic culture     Status: None (Preliminary result)   Collection Time: 06/10/15  1:22 PM  Result Value Ref Range Status   Specimen Description BONE  Final   Special Requests DIGIT AMPUTATION  Final   Gram Stain PENDING  Incomplete   Culture HOLDING FOR POSSIBLE ANAEROBE  Final   Report Status PENDING  Incomplete  Tissue culture     Status: None (Preliminary result)   Collection Time: 06/10/15  1:22 PM  Result Value Ref Range Status   Specimen Description BONE  Final   Special Requests BONE  Final   Gram Stain   Final    MANY WBC SEEN MANY GRAM POSITIVE COCCI MANY GRAM NEGATIVE RODS    Culture   Final    MODERATE GROWTH PROTEUS MIRABILIS HEAVY GROWTH GRAM POSITIVE COCCI IDENTIFICATION AND SUSCEPTIBILITIES TO FOLLOW    Report Status PENDING  Incomplete   Organism ID, Bacteria PROTEUS MIRABILIS  Final      Susceptibility   Proteus mirabilis - MIC*    AMPICILLIN SENSITIVE Sensitive     CEFTAZIDIME SENSITIVE Sensitive     CIPROFLOXACIN SENSITIVE Sensitive     TRIMETH/SULFA SENSITIVE Sensitive     CEFAZOLIN RESISTANT Resistant     *  MODERATE GROWTH PROTEUS MIRABILIS    Dg Foot Complete Left  06/09/2015   CLINICAL DATA:  Left foot cellulitis.  EXAM: LEFT FOOT - COMPLETE 3+ VIEW  COMPARISON:  None.  FINDINGS: Lytic destruction is seen involving the first distal phalanx consistent with osteomyelitis. The degenerative changes seen involving several tarsometatarsal joints. Vascular calcifications are noted.  IMPRESSION: Lytic destruction of the first distal phalanx consistent with osteomyelitis.   Electronically Signed   By: Marijo Conception, M.D.   On: 06/09/2015 14:04   Assessment Diabetic with toe osteomyelitis with amputation performed.  Recommendations Check ESR, CRP I spoke with Dr Cleda Mccreedy. Wound may need further debridement. Dr Lucky Cowboy will perform otpt PAD work up . Proteus was relatively sensitive - amp, ceft, cipr, bactrim   Would continue vanco and zosyn for now. If GPC is not MRSA then likely can be dced on augmentin 875 bid for 14 day course with otpt fu with me at the end of course.  I will review micro results tomorrow for final rec.

## 2015-06-13 ENCOUNTER — Inpatient Hospital Stay: Payer: No Typology Code available for payment source

## 2015-06-13 LAB — GLUCOSE, CAPILLARY
GLUCOSE-CAPILLARY: 161 mg/dL — AB (ref 65–99)
GLUCOSE-CAPILLARY: 131 mg/dL — AB (ref 65–99)
Glucose-Capillary: 257 mg/dL — ABNORMAL HIGH (ref 65–99)
Glucose-Capillary: 138 mg/dL — ABNORMAL HIGH (ref 65–99)

## 2015-06-13 LAB — VANCOMYCIN, TROUGH: VANCOMYCIN TR: 23 ug/mL — AB (ref 10–20)

## 2015-06-13 MED ORDER — ENALAPRIL MALEATE 10 MG PO TABS
20.0000 mg | ORAL_TABLET | Freq: Two times a day (BID) | ORAL | Status: DC
  Administered 2015-06-13 – 2015-06-14 (×2): 20 mg via ORAL
  Filled 2015-06-13 (×2): qty 2

## 2015-06-13 MED ORDER — VANCOMYCIN HCL 10 G IV SOLR
1500.0000 mg | INTRAVENOUS | Status: DC
  Administered 2015-06-13 – 2015-06-14 (×2): 1500 mg via INTRAVENOUS
  Filled 2015-06-13 (×4): qty 1500

## 2015-06-13 MED ORDER — GLIPIZIDE 5 MG PO TABS
10.0000 mg | ORAL_TABLET | Freq: Two times a day (BID) | ORAL | Status: DC
  Administered 2015-06-13 – 2015-06-14 (×3): 10 mg via ORAL
  Filled 2015-06-13 (×2): qty 2

## 2015-06-13 NOTE — Progress Notes (Signed)
3 Days Post-Op  Subjective: Patient seen. No complaints. No pain.  Objective: Vital signs in last 24 hours: Temp:  [98 F (36.7 C)-98.7 F (37.1 C)] 98.7 F (37.1 C) (09/23 0739) Pulse Rate:  [70-76] 73 (09/23 0739) Resp:  [14-18] 18 (09/23 0739) BP: (142-168)/(65-81) 168/81 mmHg (09/23 0739) SpO2:  [94 %-97 %] 94 % (09/23 0739) Last BM Date: 06/12/15  Intake/Output from previous day: 09/22 0701 - 09/23 0700 In: 1270 [P.O.:720; IV Piggyback:550] Out: 1850 [Urine:1850] Intake/Output this shift: Total I/O In: 290 [P.O.:240; IV Piggyback:50] Out: 550 [Urine:550]  Still some mild dried and active bleeding on the bandage on the left foot. The incision is well coapted. Still some mild dusky appearance to the plantar medial portion of the skin edge. No purulence is expressed from the wound. Erythema and edema are significantly improving.  Lab Results:   Recent Labs  06/12/15 0518  WBC 11.7*  HGB 11.1*  HCT 33.0*  PLT 298   BMET  Recent Labs  06/11/15 0418 06/12/15 0518  CREATININE 1.20 1.16   PT/INR No results for input(s): LABPROT, INR in the last 72 hours. ABG No results for input(s): PHART, HCO3 in the last 72 hours.  Invalid input(s): PCO2, PO2  Studies/Results: No results found.  Anti-infectives: Anti-infectives    Start     Dose/Rate Route Frequency Ordered Stop   06/13/15 1400  vancomycin (VANCOCIN) 1,500 mg in sodium chloride 0.9 % 500 mL IVPB     1,500 mg 250 mL/hr over 120 Minutes Intravenous Every 18 hours 06/13/15 0810     06/11/15 1930  vancomycin (VANCOCIN) 1,500 mg in sodium chloride 0.9 % 500 mL IVPB  Status:  Discontinued     1,500 mg 250 mL/hr over 120 Minutes Intravenous Every 12 hours 06/11/15 1859 06/13/15 0742   06/11/15 1700  vancomycin (VANCOCIN) 1,500 mg in sodium chloride 0.9 % 500 mL IVPB  Status:  Discontinued     1,500 mg 250 mL/hr over 120 Minutes Intravenous Every 12 hours 06/11/15 1319 06/11/15 1320   06/11/15 1321   vancomycin (VANCOCIN) 1,250 mg in sodium chloride 0.9 % 250 mL IVPB  Status:  Discontinued     1,250 mg 166.7 mL/hr over 90 Minutes Intravenous Every 12 hours PRN 06/11/15 1321 06/11/15 1859   06/11/15 1319  vancomycin (VANCOCIN) 1,500 mg in sodium chloride 0.9 % 500 mL IVPB  Status:  Discontinued     1,500 mg 250 mL/hr over 120 Minutes Intravenous Every 12 hours PRN 06/11/15 1320 06/11/15 1321   06/10/15 1200  vancomycin (VANCOCIN) 1,250 mg in sodium chloride 0.9 % 250 mL IVPB  Status:  Discontinued     1,250 mg 166.7 mL/hr over 90 Minutes Intravenous Every 8 hours 06/10/15 0857 06/11/15 1312   06/10/15 0400  vancomycin (VANCOCIN) 1,500 mg in sodium chloride 0.9 % 500 mL IVPB  Status:  Discontinued     1,500 mg 250 mL/hr over 120 Minutes Intravenous Every 12 hours 06/09/15 1517 06/09/15 1716   06/10/15 0300  vancomycin (VANCOCIN) 1,500 mg in sodium chloride 0.9 % 500 mL IVPB  Status:  Discontinued     1,500 mg 250 mL/hr over 120 Minutes Intravenous Every 12 hours 06/09/15 1801 06/10/15 0854   06/09/15 1545  vancomycin (VANCOCIN) 2,000 mg in sodium chloride 0.9 % 500 mL IVPB  Status:  Discontinued     2,000 mg 250 mL/hr over 120 Minutes Intravenous  Once 06/09/15 1532 06/09/15 1535   06/09/15 1545  vancomycin (VANCOCIN) 1,500 mg  in sodium chloride 0.9 % 500 mL IVPB     1,500 mg 250 mL/hr over 120 Minutes Intravenous  Once 06/09/15 1541 06/09/15 2332   06/09/15 1515  vancomycin (VANCOCIN) 2,500 mg in sodium chloride 0.9 % 500 mL IVPB  Status:  Discontinued     2,500 mg 250 mL/hr over 120 Minutes Intravenous  Once 06/09/15 1511 06/09/15 1531   06/09/15 1515  vancomycin (VANCOCIN) 1,500 mg in sodium chloride 0.9 % 500 mL IVPB  Status:  Discontinued     1,500 mg 250 mL/hr over 120 Minutes Intravenous Every 12 hours 06/09/15 1511 06/09/15 1517   06/09/15 1515  piperacillin-tazobactam (ZOSYN) IVPB 3.375 g     3.375 g 12.5 mL/hr over 240 Minutes Intravenous 3 times per day 06/09/15 1511      06/09/15 1315  vancomycin (VANCOCIN) IVPB 1000 mg/200 mL premix  Status:  Discontinued     1,000 mg 200 mL/hr over 60 Minutes Intravenous Every 24 hours 06/09/15 1306 06/09/15 1511   06/09/15 1315  piperacillin-tazobactam (ZOSYN) IVPB 3.375 g  Status:  Discontinued     3.375 g 100 mL/hr over 30 Minutes Intravenous 4 times per day 06/09/15 1306 06/09/15 1511      Assessment/Plan: s/p Procedure(s): Left great toe amputation  (Left) Assessment: Osteomyelitis with gangrene left great toe status post amputation.   Plan: Betadine applied to the incision followed by sterile bandage. Currently waiting on ID of the gram-positive cocci from the culture. Once this is back the patient should be stable for discharge on appropriate antibiotics per infectious disease. Recommend follow-up outpatient for evaluation of the wound middle portion of next week. I will follow tomorrow if not discharged today. No need for home health care. Patient instructed to keep the bandage clean dry and intact and do not remove.  LOS: 4 days    Raymond Kamiya W. 06/13/2015

## 2015-06-13 NOTE — Progress Notes (Signed)
Patient ID: Raymond Avery, male   DOB: January 04, 1956, 59 y.o.   MRN: [id number] [facility] PROGRESS NOTE  HPI/Subjective: Patient presented to the hospital with left great toe gangrene underwent a left great toe amputation by Dr. Alberteen Spindle on 06/10/2015. Feels relatively good now. Denies any significant pain in the incision site, however, admits of having some left wrist discomfort, pain extending to forearm. Some bleeding during operation. Remains on vancomycin and Zosyn, wound cultures are growing moderate growth of Proteus mirabilis and heavy growth of gram-positive cocci, Proteus is sensitive to ampicillin, ceftazidime, ciprofloxacin, and trimethoprim/ sulfamethoxazole and blood cultures are negative so far. Patient is afebrile. ID consultation is appreciated. Wound looked dusky per Dr. Alberteen Spindle at the inferior aspect of the incision, may need further debridement. Patient is complaining of left wrist pain and swelling and decreased range of motion. Uric acid is normal   Objective: Filed Vitals:   06/13/15 1602  BP: 164/74  Pulse: 66  Temp: 99.2 F (37.3 C)  Resp: 18    Filed Weights   06/09/15 1509  Weight: 142.883 kg (315 lb)    ROS: Review of Systems  Constitutional: Positive for fever. Negative for chills.  Eyes: Negative for blurred vision.  Respiratory: Negative for cough and shortness of breath.   Cardiovascular: Negative for chest pain.  Gastrointestinal: Negative for nausea, vomiting, abdominal pain, diarrhea and constipation.  Genitourinary: Negative for dysuria.  Musculoskeletal: Negative for joint pain.  Neurological: Negative for dizziness and headaches.   Exam: Physical Exam  Constitutional: He is oriented to person, place, and time.  HENT:  Nose: No mucosal edema.  Mouth/Throat: No oropharyngeal exudate or posterior oropharyngeal edema.  Eyes: Conjunctivae, EOM and lids are normal. Pupils are equal, round, and reactive to light.  Neck: No JVD present.  Carotid bruit is not present. No edema present. No thyroid mass and no thyromegaly present.  Cardiovascular: S1 normal and S2 normal.  Exam reveals no gallop.   No murmur heard. Pulses:      Dorsalis pedis pulses are 2+ on the right side, and 2+ on the left side.  Respiratory: No respiratory distress. He has no wheezes. He has no rhonchi. He has no rales.  GI: Soft. Bowel sounds are normal. There is no tenderness.  Musculoskeletal:       Right ankle: He exhibits swelling.       Left ankle: He exhibits swelling.  Lymphadenopathy:    He has no cervical adenopathy.  Neurological: He is alert and oriented to person, place, and time. No cranial nerve deficit.  Skin: Skin is warm. No rash noted. Nails show no clubbing.  Left first toe very swollen and covered by bandage.  Psychiatric: He has a normal mood and affect.   Left wrist is swollen, decreased range of motion, tender to palpation at radial more than ulnar side Data Reviewed: Basic Metabolic Panel:  Recent Labs Lab 06/09/15 1311 06/10/15 0424 06/11/15 0418 06/12/15 0518  NA 130* 133*  --   --   K 4.0 3.9  --   --   CL 97* 102  --   --   CO2 22 25  --   --   GLUCOSE 222* 211*  --   --   BUN 32* 25*  --   --   CREATININE 1.51* 1.22 1.20 1.16  CALCIUM 8.8* 8.4*  --   --    Liver Function Tests:  Recent Labs Lab 06/09/15 1311  AST 23  ALT 27  ALKPHOS 66  BILITOT 0.8  PROT 7.9  ALBUMIN 3.4*   CBC:  Recent Labs Lab 06/09/15 1311 06/10/15 0424 06/12/15 0518  WBC 12.9* 11.2* 11.7*  NEUTROABS 9.8*  --  8.4*  HGB 12.9* 12.1* 11.1*  HCT 38.5* 35.4* 33.0*  MCV 86.4 87.1 86.6  PLT 290 239 298   CBG:  Recent Labs Lab 06/12/15 0716 06/12/15 1520 06/12/15 2133 06/13/15 0735 06/13/15 1120  GLUCAP 156* 132* 194* 161* 257*    Recent Results (from the past 240 hour(s))  Culture, blood (routine x 2)     Status: None (Preliminary result)   Collection Time: 06/09/15  1:11 PM  Result Value Ref Range Status    Specimen Description BLOOD LEFT ARM  Final   Special Requests BOTTLES DRAWN AEROBIC AND ANAEROBIC 10CC  Final   Culture NO GROWTH 3 DAYS  Final   Report Status PENDING  Incomplete  Culture, blood (routine x 2)     Status: None (Preliminary result)   Collection Time: 06/09/15  1:11 PM  Result Value Ref Range Status   Specimen Description BLOOD LEFT HAND  Final   Special Requests BOTTLES DRAWN AEROBIC AND ANAEROBIC 10CC  Final   Culture NO GROWTH 3 DAYS  Final   Report Status PENDING  Incomplete  Anaerobic culture     Status: None (Preliminary result)   Collection Time: 06/10/15  1:22 PM  Result Value Ref Range Status   Specimen Description BONE  Final   Special Requests DIGIT AMPUTATION  Final   Gram Stain PENDING  Incomplete   Culture HOLDING FOR POSSIBLE ANAEROBE  Final   Report Status PENDING  Incomplete  Tissue culture     Status: None (Preliminary result)   Collection Time: 06/10/15  1:22 PM  Result Value Ref Range Status   Specimen Description BONE  Final   Special Requests BONE  Final   Gram Stain   Final    MANY WBC SEEN MANY GRAM POSITIVE COCCI MANY GRAM NEGATIVE RODS    Culture   Final    MODERATE GROWTH PROTEUS MIRABILIS HEAVY GROWTH GRAM POSITIVE COCCI RARE GRAM NEGATIVE RODS IDENTIFICATION AND SUSCEPTIBILITIES TO FOLLOW    Report Status PENDING  Incomplete   Organism ID, Bacteria PROTEUS MIRABILIS  Final      Susceptibility   Proteus mirabilis - MIC*    AMPICILLIN SENSITIVE Sensitive     CEFTAZIDIME SENSITIVE Sensitive     CIPROFLOXACIN SENSITIVE Sensitive     TRIMETH/SULFA SENSITIVE Sensitive     CEFAZOLIN RESISTANT Resistant     * MODERATE GROWTH PROTEUS MIRABILIS     Studies: No results found.  Scheduled Meds: . enalapril  10 mg Oral BID  . glipiZIDE  5 mg Oral BID AC  . hydrALAZINE  100 mg Oral BID  . Influenza vac split quadrivalent PF  0.5 mL Intramuscular Tomorrow-1000  . insulin aspart  0-15 Units Subcutaneous TID WC  . insulin aspart  0-5  Units Subcutaneous QHS  . metoprolol succinate  25 mg Oral Daily  . piperacillin-tazobactam (ZOSYN)  IV  3.375 g Intravenous 3 times per day  . sodium chloride  3 mL Intravenous Q12H  . vancomycin  1,500 mg Intravenous Q18H   Continuous Infusions:    Assessment/Plan: 1. Clinical sepsis due to osteomyelitis of the left first toe. Wound culture is growing Proteus as well as gram-positive cocci, appreciate infectious disease input.   Continue vancomycin and Zosyn for now. If wound culture shows no Staphylococcus aureus,  patient may be discharged on Augmentin per Dr. Jarrett Ables recommendations for two-week therapy when ready to be discharged by Dr. Alberteen Spindle 2. Acute Kidney injury , likely due to dehydration, patient very likely has mild renal insufficiency, chronic- improved with fluids. 3. Essential HTN-, poor control. Systolic blood pressure remains between 150s to 160s, advance , blood pressure medications today to home dose since kidney function improved 4. Type 2 Diabetes with neuropathy- resumed diabetic meds, continue sliding scale insulin, blood glucose levels are ranging between 140s to 250s. Patient apparently was not on insulin Lantus recently, advance glipizide, get hemoglobin A1c 5. Hx afib- holding xarelto, resume per podiatrist's recommendations, patient may need to undergo repeated operative procedure 6. Peripheral vascular disease. Patient is to follow-up with Dr. Wyn Quaker as outpatient for arterial duplex Left wrist pain, uric acid level his normal. However, cannot rule out pseudo-gout, at orthopedic surgery involved and get an x-ray of left wrist to rule out injury  Code Status:     Code Status Orders        Start     Ordered   06/09/15 1302  Full code   Continuous     06/09/15 1306     Disposition Plan: watch post op for a few days  Consultants:  Podiatry DR Alberteen Spindle, vascular surgery  Time spent: 45 minutes  [facility]  [facility]  Hospitalists

## 2015-06-13 NOTE — Progress Notes (Signed)
Gave pt his post-op surgical wedge shoe. Pt states he does not want to wear it. I educated pt on importance of keeping weight off of operative site and showed him how the shoe is worn. Pt was able to repeat back the importance of compliance with shoe and was able to verbalize how it is worn. Will continue to encourage its use at home and will have pt demonstrate how to wear shoe later this afternoon.

## 2015-06-13 NOTE — Progress Notes (Signed)
Inpatient Diabetes Program Recommendations  AACE/ADA: New Consensus Statement on Inpatient Glycemic Control (2015)  Target Ranges:  Prepandial:   less than 140 mg/dL      Peak postprandial:   less than 180 mg/dL (1-2 hours)      Critically ill patients:  140 - 180 mg/dL   Diabetes history: Type 2  Outpatient Diabetes medications: Levemir 30 units qhs  Glipizide  bid  Current DM Orders: Novolog Moderate SSI (0-15 units) TID AC and Novolog 0-5 units QHS, Glipizide  bid   Inpatient Diabetes Program Recommendations:  1. Consider obtaining an A1C to determine blood sugar control at home 2. Consider starting 1/2 home dose of basal insulin, Levemir 15 units qhs  Susette Racer, RN, [place], [place], CDE Diabetes Coordinator Inpatient Diabetes Program  [phone] (Team Pager) [phone] [facility] Office) 06/13/2015 11:36 AM

## 2015-06-13 NOTE — Progress Notes (Signed)
ANTIBIOTIC CONSULT NOTE - FOLLOW UP  Pharmacy Consult for vancomycin Indication: osteomyelitis   No Known Allergies  Patient Measurements: Height:  (190.5 cm) Weight: (!) 315 lb (142.883 kg) IBW/kg (Calculated) : 84.5 Adjusted Body Weight: 107.86  Vital Signs: Temp: 98.7 F (37.1 C) (09/23 0739) Temp Source: Oral (09/23 0739) BP: 168/81 mmHg (09/23 0739) Pulse Rate: 73 (09/23 0739) Intake/Output from previous day: 09/22 0701 - 09/23 0700 In: 1270 [P.O.:720; IV Piggyback:550] Out: 1850 [Urine:1850] Intake/Output from this shift:    Labs:  Recent Labs  06/11/15 0418 06/12/15 0518  WBC  --  11.7*  HGB  --  11.1*  PLT  --  298  CREATININE 1.20 1.16   Estimated Creatinine Clearance: 102.1 mL/min (by C-G formula based on Cr of 1.16).  Recent Labs  06/11/15 1112 06/11/15 1553 06/13/15 0641  VANCOTROUGH 25*  --  23*  VANCORANDOM  --  17  --      Microbiology: Recent Results (from the past 720 hour(s))  Culture, blood (routine x 2)     Status: None (Preliminary result)   Collection Time: 06/09/15  1:11 PM  Result Value Ref Range Status   Specimen Description BLOOD LEFT ARM  Final   Special Requests BOTTLES DRAWN AEROBIC AND ANAEROBIC 10CC  Final   Culture NO GROWTH 3 DAYS  Final   Report Status PENDING  Incomplete  Culture, blood (routine x 2)     Status: None (Preliminary result)   Collection Time: 06/09/15  1:11 PM  Result Value Ref Range Status   Specimen Description BLOOD LEFT HAND  Final   Special Requests BOTTLES DRAWN AEROBIC AND ANAEROBIC 10CC  Final   Culture NO GROWTH 3 DAYS  Final   Report Status PENDING  Incomplete  Anaerobic culture     Status: None (Preliminary result)   Collection Time: 06/10/15  1:22 PM  Result Value Ref Range Status   Specimen Description BONE  Final   Special Requests DIGIT AMPUTATION  Final   Gram Stain PENDING  Incomplete   Culture HOLDING FOR POSSIBLE ANAEROBE  Final   Report Status PENDING  Incomplete  Tissue  culture     Status: None (Preliminary result)   Collection Time: 06/10/15  1:22 PM  Result Value Ref Range Status   Specimen Description BONE  Final   Special Requests BONE  Final   Gram Stain   Final    MANY WBC SEEN MANY GRAM POSITIVE COCCI MANY GRAM NEGATIVE RODS    Culture   Final    MODERATE GROWTH PROTEUS MIRABILIS HEAVY GROWTH GRAM POSITIVE COCCI IDENTIFICATION AND SUSCEPTIBILITIES TO FOLLOW    Report Status PENDING  Incomplete   Organism ID, Bacteria PROTEUS MIRABILIS  Final      Susceptibility   Proteus mirabilis - MIC*    AMPICILLIN SENSITIVE Sensitive     CEFTAZIDIME SENSITIVE Sensitive     CIPROFLOXACIN SENSITIVE Sensitive     TRIMETH/SULFA SENSITIVE Sensitive     CEFAZOLIN RESISTANT Resistant     * MODERATE GROWTH PROTEUS MIRABILIS    Anti-infectives    Start     Dose/Rate Route Frequency Ordered Stop   06/11/15 1930  vancomycin (VANCOCIN) 1,500 mg in sodium chloride 0.9 % 500 mL IVPB  Status:  Discontinued     1,500 mg 250 mL/hr over 120 Minutes Intravenous Every 12 hours 06/11/15 1859 06/13/15 0742   06/11/15 1700  vancomycin (VANCOCIN) 1,500 mg in sodium chloride 0.9 % 500 mL IVPB  Status:  Discontinued     1,500 mg 250 mL/hr over 120 Minutes Intravenous Every 12 hours 06/11/15 1319 06/11/15 1320   06/11/15 1321  vancomycin (VANCOCIN) 1,250 mg in sodium chloride 0.9 % 250 mL IVPB  Status:  Discontinued     1,250 mg 166.7 mL/hr over 90 Minutes Intravenous Every 12 hours PRN 06/11/15 1321 06/11/15 1859   06/11/15 1319  vancomycin (VANCOCIN) 1,500 mg in sodium chloride 0.9 % 500 mL IVPB  Status:  Discontinued     1,500 mg 250 mL/hr over 120 Minutes Intravenous Every 12 hours PRN 06/11/15 1320 06/11/15 1321   06/10/15 1200  vancomycin (VANCOCIN) 1,250 mg in sodium chloride 0.9 % 250 mL IVPB  Status:  Discontinued     1,250 mg 166.7 mL/hr over 90 Minutes Intravenous Every 8 hours 06/10/15 0857 06/11/15 1312   06/10/15 0400  vancomycin (VANCOCIN) 1,500 mg in  sodium chloride 0.9 % 500 mL IVPB  Status:  Discontinued     1,500 mg 250 mL/hr over 120 Minutes Intravenous Every 12 hours 06/09/15 1517 06/09/15 1716   06/10/15 0300  vancomycin (VANCOCIN) 1,500 mg in sodium chloride 0.9 % 500 mL IVPB  Status:  Discontinued     1,500 mg 250 mL/hr over 120 Minutes Intravenous Every 12 hours 06/09/15 1801 06/10/15 0854   06/09/15 1545  vancomycin (VANCOCIN) 2,000 mg in sodium chloride 0.9 % 500 mL IVPB  Status:  Discontinued     2,000 mg 250 mL/hr over 120 Minutes Intravenous  Once 06/09/15 1532 06/09/15 1535   06/09/15 1545  vancomycin (VANCOCIN) 1,500 mg in sodium chloride 0.9 % 500 mL IVPB     1,500 mg 250 mL/hr over 120 Minutes Intravenous  Once 06/09/15 1541 06/09/15 2332   06/09/15 1515  vancomycin (VANCOCIN) 2,500 mg in sodium chloride 0.9 % 500 mL IVPB  Status:  Discontinued     2,500 mg 250 mL/hr over 120 Minutes Intravenous  Once 06/09/15 1511 06/09/15 1531   06/09/15 1515  vancomycin (VANCOCIN) 1,500 mg in sodium chloride 0.9 % 500 mL IVPB  Status:  Discontinued     1,500 mg 250 mL/hr over 120 Minutes Intravenous Every 12 hours 06/09/15 1511 06/09/15 1517   06/09/15 1515  piperacillin-tazobactam (ZOSYN) IVPB 3.375 g     3.375 g 12.5 mL/hr over 240 Minutes Intravenous 3 times per day 06/09/15 1511     06/09/15 1315  vancomycin (VANCOCIN) IVPB 1000 mg/200 mL premix  Status:  Discontinued     1,000 mg 200 mL/hr over 60 Minutes Intravenous Every 24 hours 06/09/15 1306 06/09/15 1511   06/09/15 1315  piperacillin-tazobactam (ZOSYN) IVPB 3.375 g  Status:  Discontinued     3.375 g 100 mL/hr over 30 Minutes Intravenous 4 times per day 06/09/15 1306 06/09/15 1511      Assessment: 59 yo patient with PMH of diabetes was admitted with gangrene and osteomyelitis of the left great toe. Patient had great toe amputation 9/22. Pharmacy has been consulted for Vancomycin dosing and monitoring. Patient also receiving Zosyn 3.375 q8h.  Patients CrCl 102.1 and  Scr 1.16. Patient currently receiving Vancomycin   IV Q12H.  Trough supratherapeutic at 23  this morning  9/23 @ 0641.  Goal of Therapy:  Vancomycin trough level 15-20 mcg/ml  Plan:  9/23: New calculated   t1/2:15 hr      Ke: 0.0459       Vd:73 Will restart vancomycin  every 18 hrs starting 6-7 hours after trough was drawn.  Trough ordered before  4th dose. Will continue to monitor patients renal function and make adjustments as needed.  Susceptibility  results are still pending, will recommended deescalation of Abx is not MRSA.    Cher Nakai, PharmD Pharmacy Resident

## 2015-06-13 NOTE — Progress Notes (Signed)
Pt resting in bed today, new complaint of left wrist pain and weakness. MD is aware, wrist Xray pending. Pain is controlled, he states he is ready to go home. He has received ortho shoe and is able to demonstrate how it is properly fitted and the importance of its use at home.

## 2015-06-14 LAB — GLUCOSE, CAPILLARY
GLUCOSE-CAPILLARY: 238 mg/dL — AB (ref 65–99)
Glucose-Capillary: 152 mg/dL — ABNORMAL HIGH (ref 65–99)
Glucose-Capillary: 183 mg/dL — ABNORMAL HIGH (ref 65–99)

## 2015-06-14 LAB — TISSUE CULTURE

## 2015-06-14 LAB — CREATININE, SERUM
CREATININE: 1.12 mg/dL (ref 0.61–1.24)
GFR calc Af Amer: >60 mL/min (ref >60)

## 2015-06-14 LAB — HEMOGLOBIN A1C: Hgb A1c MFr Bld: 7.4 % — ABNORMAL HIGH (ref 4.0–6.0)

## 2015-06-14 MED ORDER — PREDNISONE 20 MG PO TABS: 40.0000 mg | ORAL_TABLET | Freq: Every day | ORAL | Status: AC

## 2015-06-14 MED ORDER — OXYCODONE-ACETAMINOPHEN 5-325 MG PO TABS: 1.0000 | ORAL_TABLET | ORAL | Status: DC | PRN

## 2015-06-14 MED ORDER — CIPROFLOXACIN HCL 500 MG PO TABS: 500.0000 mg | ORAL_TABLET | Freq: Two times a day (BID) | ORAL | Status: AC

## 2015-06-14 MED ORDER — AMOXICILLIN-POT CLAVULANATE 875-125 MG PO TABS
1.0000 | ORAL_TABLET | Freq: Two times a day (BID) | ORAL | Status: AC
Start: 2015-06-14 — End: 2015-06-24

## 2015-06-14 MED ORDER — PREDNISONE 20 MG PO TABS
40.0000 mg | ORAL_TABLET | Freq: Once | ORAL | Status: AC
  Administered 2015-06-14: 40 mg via ORAL
  Filled 2015-06-14: qty 2

## 2015-06-14 NOTE — Progress Notes (Signed)
Discharge instructions given per MD order, Rx.slip given for percocet, home and new medications reviewed, patient verbalized understanding of discharge instructions given. Discharge via wheelchair with Courtney,nurse tech.  Patient to drive self home.

## 2015-06-14 NOTE — Consult Note (Signed)
Left dorsal wrist swelling, not hot or erythematous. Xrays reviewed. Probable gout or pseudogout attack, will see him later in week in office if not resolved.

## 2015-06-14 NOTE — Discharge Summary (Signed)
Raymond Avery, 59 y.o., DOB [birth year], MRN [id number]. Admission date: 06/09/2015 Discharge Date 06/14/2015 Primary MD No PCP Per Patient Admitting Physician Katha Hamming, MD  Admission Diagnosis  Osteomyelitis lt Great Toe ff  Discharge Diagnosis   Active Problems:   Cellulitis of left foot with osteomyelitis with gangrene of the great toe status post amputation  clinical sepsis due to cellulitis and osteomyelitis of the foot Wrist swelling likely pseudogout x-ray negative seen by Ortho Evra have outpatient follow-up prednisone trial Acute kidney injury felt to be due to dehydration not ATN now resolved Essential hypertension Type 2 diabetes History atrial fibrillation Peripheral vascular disease       Hospital Course  Raymond Avery is a 59 y.o. male with a known history of hypertension, diabetes, obesity comes in secondary to left to the great toe infection. The patient is a direct admit from Dr. Tawanna Cooler Avery's office concerning worsening infection. Patient was admitted and started on anabiotic's. He was seen by podiatry and had great toe amputation of the left foot. He also had cultures from the wound which grew out Proteus enterococcus and myeloid species. Patient will be discharged on oral anabiotic's. He'll follow up with podiatry as well as infectious disease. Patient currently doing better and is stable for discharge.          Consults  infectious disease and podiatry  Significant Tests:  See full reports for all details    Dg Wrist Complete Left  06/13/2015   CLINICAL DATA:  Left wrist pain x 2 days. No prev surg to wrist.  EXAM: LEFT WRIST - COMPLETE 3+ VIEW  COMPARISON:  None.  FINDINGS: There is degenerative change in the first carpometacarpal joint. No acute fracture or subluxation. Vascular calcifications are present.  IMPRESSION: No evidence for acute  abnormality.   Electronically Signed   By: Norva Pavlov M.D.   On: 06/13/2015 18:00   Dg Foot Complete  Left  06/09/2015   CLINICAL DATA:  Left foot cellulitis.  EXAM: LEFT FOOT - COMPLETE 3+ VIEW  COMPARISON:  None.  FINDINGS: Lytic destruction is seen involving the first distal phalanx consistent with osteomyelitis. The degenerative changes seen involving several tarsometatarsal joints. Vascular calcifications are noted.  IMPRESSION: Lytic destruction of the first distal phalanx consistent with osteomyelitis.   Electronically Signed   By: Lupita Raider, M.D.   On: 06/09/2015 14:04       Today   Subjective:   Raymond Avery still has some swelling on the left wrist no other complaints  Objective:   Blood pressure 158/76, pulse 67, temperature 97.8 F (36.6 C), temperature source Oral, resp. rate 18, height  (1.905 m), weight 142.883 kg (315 lb), SpO2 97 %.  .  Intake/Output Summary (Last 24 hours) at 06/14/15 1227 Last data filed at 06/14/15 0900  Gross per 24 hour  Intake    530 ml  Output   1000 ml  Net   -470 ml    Exam VITAL SIGNS: Blood pressure 158/76, pulse 67, temperature 97.8 F (36.6 C), temperature source Oral, resp. rate 18, height  (1.905 m), weight 142.883 kg (315 lb), SpO2 97 %.  GENERAL:  59 y.o.-year-old patient lying in the bed with no acute distress.  EYES: Pupils equal, round, reactive to light and accommodation. No scleral icterus. Extraocular muscles intact.  HEENT: Head atraumatic, normocephalic. Oropharynx and nasopharynx clear.  NECK:  Supple, no jugular venous distention. No thyroid enlargement, no tenderness.  LUNGS: Normal breath sounds bilaterally,  no wheezing, rales,rhonchi or crepitation. No use of accessory muscles of respiration.  CARDIOVASCULAR: S1, S2 normal. No murmurs, rubs, or gallops.  ABDOMEN: Soft, nontender, nondistended. Bowel sounds present. No organomegaly or mass.  EXTREMITIES: Has dressing in place on the left foot, left wrist swelling NEUROLOGIC: Cranial nerves II through XII are intact. Muscle strength 5/5 in all  extremities. Sensation intact. Gait not checked.  PSYCHIATRIC: The patient is alert and oriented x 3.  SKIN: No obvious rash, lesion, or ulcer.   Data Review     CBC w Diff: Lab Results  Component Value Date   WBC 11.7* 06/12/2015   WBC 8.7 09/25/2013   HGB 11.1* 06/12/2015   HGB 17.1 09/25/2013   HCT 33.0* 06/12/2015   HCT 49.8 09/25/2013   PLT 298 06/12/2015   PLT 251 09/25/2013   LYMPHOPCT 11 06/12/2015   LYMPHOPCT 21.6 05/18/2013   MONOPCT 11 06/12/2015   MONOPCT 13.5 05/18/2013   EOSPCT 5 06/12/2015   EOSPCT 5.5 05/18/2013   BASOPCT 1 06/12/2015   BASOPCT 0.7 05/18/2013   CMP: Lab Results  Component Value Date   NA 133* 06/10/2015   NA 133* 09/25/2013   K 3.9 06/10/2015   K 4.2 09/25/2013   CL 102 06/10/2015   CL 101 09/25/2013   CO2 25 06/10/2015   CO2 28 09/25/2013   BUN 25* 06/10/2015   BUN 13 09/25/2013   CREATININE 1.12 06/14/2015   CREATININE 1.07 09/25/2013   PROT 7.9 06/09/2015   PROT 8.0 09/25/2013   ALBUMIN 3.4* 06/09/2015   ALBUMIN 3.5 09/25/2013   BILITOT 0.8 06/09/2015   BILITOT 0.8 09/25/2013   ALKPHOS 66 06/09/2015   ALKPHOS 89 09/25/2013   AST 23 06/09/2015   AST 22 09/25/2013   ALT 27 06/09/2015   ALT 20 09/25/2013  .  Micro Results Recent Results (from the past 240 hour(s))  Culture, blood (routine x 2)     Status: None (Preliminary result)   Collection Time: 06/09/15  1:11 PM  Result Value Ref Range Status   Specimen Description BLOOD LEFT ARM  Final   Special Requests BOTTLES DRAWN AEROBIC AND ANAEROBIC 10CC  Final   Culture NO GROWTH 3 DAYS  Final   Report Status PENDING  Incomplete  Culture, blood (routine x 2)     Status: None (Preliminary result)   Collection Time: 06/09/15  1:11 PM  Result Value Ref Range Status   Specimen Description BLOOD LEFT HAND  Final   Special Requests BOTTLES DRAWN AEROBIC AND ANAEROBIC 10CC  Final   Culture NO GROWTH 3 DAYS  Final   Report Status PENDING  Incomplete  Anaerobic culture      Status: None (Preliminary result)   Collection Time: 06/10/15  1:22 PM  Result Value Ref Range Status   Specimen Description BONE  Final   Special Requests DIGIT AMPUTATION  Final   Culture HOLDING FOR POSSIBLE ANAEROBE  Final   Report Status PENDING  Incomplete  Tissue culture     Status: None   Collection Time: 06/10/15  1:22 PM  Result Value Ref Range Status   Specimen Description BONE  Final   Special Requests BONE  Final   Gram Stain   Final    MANY WBC SEEN MANY GRAM POSITIVE COCCI MANY GRAM NEGATIVE RODS    Culture   Final    MODERATE GROWTH PROTEUS MIRABILIS HEAVY GROWTH ENTEROCOCCUS FAECALIS RARE MYROIDES SPECIES    Report Status 06/14/2015 FINAL  Final  Organism ID, Bacteria PROTEUS MIRABILIS  Final   Organism ID, Bacteria ENTEROCOCCUS FAECALIS  Final   Organism ID, Bacteria MYROIDES SPECIES  Final      Susceptibility   Myroides species - MIC*    CEFTAZIDIME >=64 RESISTANT Resistant     GENTAMICIN >=16 RESISTANT Resistant     CIPROFLOXACIN 1 SENSITIVE Sensitive     IMIPENEM 2 SENSITIVE Sensitive     TRIMETH/SULFA >=320 RESISTANT Resistant     LEVOFLOXACIN Value in next row Sensitive      SENSITIVE<=0.5    * RARE MYROIDES SPECIES   Proteus mirabilis - MIC*    AMPICILLIN Value in next row Sensitive      SENSITIVE<=0.5    CEFTAZIDIME Value in next row Sensitive      SENSITIVE<=0.5    CIPROFLOXACIN Value in next row Sensitive      SENSITIVE<=0.5    TRIMETH/SULFA Value in next row Sensitive      SENSITIVE<=0.5    CEFAZOLIN Value in next row Resistant      SENSITIVE<=0.5    * MODERATE GROWTH PROTEUS MIRABILIS   Enterococcus faecalis - MIC*    AMPICILLIN Value in next row Sensitive      SENSITIVE<=0.5    LEVOFLOXACIN Value in next row Sensitive      SENSITIVE1    LINEZOLID Value in next row Sensitive      SENSITIVE1    * HEAVY GROWTH ENTEROCOCCUS FAECALIS        Code Status Orders        Start     Ordered   06/10/15 1451  Full code   Continuous      06/10/15 1450          Follow-up Information    Follow up with Rayetta Humphrey, MD. Go on 06/19/2015.   Specialty:  Family Medicine   Why:  App't at 0940AM   Contact information:   [address] [place] [address] [phone]       Follow up with Ricci Barker., MD. Go on 06/23/2015.   Specialty:  Podiatry   Why:  App't at [id number]   Contact information:   [address] [place] [address] [phone]       Follow up with DEW,JASON, MD In 2 weeks.   Specialties:  Vascular Surgery, Radiology, Interventional Cardiology   Why:  with ABIs   Contact information:   [address] [place] [address] [phone]       Follow up with Ricci Barker., MD In 5 days.   Specialty:  Podiatry   Contact information:   [address] [place] [address] [phone]       Follow up with MENZ,MICHAEL, MD In 5 days.   Specialty:  Orthopedic Surgery   Why:  left wrist swelling   Contact information:   [address] [place] [address] [phone]       Discharge Medications     Medication List    TAKE these medications        amiodarone 200 MG tablet  Commonly known as:  PACERONE  Take 1 tablet (200 mg total) by mouth daily.     amoxicillin-clavulanate 875-125 MG per tablet  Commonly known as:  AUGMENTIN  Take 1 tablet by mouth 2 (two) times daily.     ciprofloxacin 500 MG tablet  Commonly known as:  CIPRO  Take 1 tablet (500 mg total) by mouth 2 (two) times daily.     diltiazem  300 MG 24 hr capsule  Commonly known as:  TIAZAC  Take 1 capsule (300 mg total) by mouth daily.     enalapril 20 MG tablet  Commonly known as:  VASOTEC  Take 1 tablet (20 mg total) by mouth daily.     glipiZIDE 5 MG tablet  Commonly known as:  GLUCOTROL  Take 1 tablet (5 mg total) by mouth 2 (two) times daily before a meal.     hydrochlorothiazide 25 MG tablet  Commonly known as:  HYDRODIURIL  TAKE 2 TABLETS BY MOUTH EVERY DAY     insulin  detemir 100 unit/ml Soln  Commonly known as:  LEVEMIR  Inject 30 Units into the skin daily at 10 pm.     metoprolol tartrate 25 MG tablet  Commonly known as:  LOPRESSOR  Take 1 tablet (25 mg total) by mouth 2 (two) times daily.     oxyCODONE-acetaminophen 5-325 MG per tablet  Commonly known as:  PERCOCET/ROXICET  Take 1-2 tablets by mouth every 4 (four) hours as needed for severe pain.     predniSONE 20 MG tablet  Commonly known as:  DELTASONE  Take 2 tablets (40 mg total) by mouth daily.     rivaroxaban 20 MG Tabs tablet  Commonly known as:  XARELTO  Take 20 mg by mouth daily.           Total Time in preparing paper work, data evaluation and todays exam - 35 minutes  Auburn Bilberry M.D on 06/14/2015 at 12:27 PM  [facility]   Office  [phone]

## 2015-06-14 NOTE — Progress Notes (Signed)
Patient Demographics  Raymond Avery, is a 59 y.o. male   MRN: [id number]   DOB - 14-Jul-1956  Admit Date - 06/09/2015    Outpatient Primary MD for the patient is No PCP Per Patient  Consult requested in the Hospital by Auburn Bilberry, MD, On 06/14/2015       With History of -Gangrene to the left great toe.  Surgical amputation to toe by Dr Alberteen Spindle on 9-20.  Past Medical History  Diagnosis Date  . Systemic inflammatory response syndrome   . UTI (urinary tract infection)   . Diabetes   . Hypertension   . Hyponatremia   . Leukocytosis   . Arrhythmia   . A-fib   . Acute renal failure   . CHF (congestive heart failure)       Past Surgical History  Procedure Laterality Date  . Amputation toe Left 06/10/2015    Procedure: Left great toe amputation ;  Surgeon: Linus Galas, MD;  Location: [facility] ORS;  Service: Podiatry;  Laterality: Left;    in for   No chief complaint on file.    HPI  Raymond Avery  is a 59 y.o. male, he states he is doing well with no pain in his foot. He states he just wants to get out of here.    Review of Systems    In addition to the HPI above,  No Fever-chills, No Headache, No changes with Vision or hearing, No problems swallowing food or Liquids, No Chest pain, Cough or Shortness of Breath, No Abdominal pain, No Nausea or Vommitting, Bowel movements are regular, No Blood in stool or Urine, No dysuria, No new skin rashes or bruises, No new joints pains-aches,  No new weakness, tingling, numbness in any extremity, No recent weight gain or loss, No polyuria, polydypsia or polyphagia, No significant Mental Stressors.  A full 10 point Review of Systems was done, except as stated above, all other Review of Systems were negative.   Social History Social History    Substance Use Topics  . Smoking status: Never Smoker   . Smokeless tobacco: Not on file  . Alcohol Use: Not on file     Family History Family History  Problem Relation Age of Onset  . Heart attack Mother     Prior to Admission medications   Medication Sig Start Date End Date Taking? Authorizing Provider  amiodarone (PACERONE) 200 MG tablet Take 1 tablet (200 mg total) by mouth daily. 01/04/13  Yes Antonieta Iba, MD  diltiazem (TIAZAC) 300 MG 24 hr capsule Take 1 capsule (300 mg total) by mouth daily. 01/04/13  Yes Antonieta Iba, MD  enalapril (VASOTEC) 20 MG tablet Take 1 tablet (20 mg total) by mouth daily. 01/19/13  Yes Antonieta Iba, MD  glipiZIDE (GLUCOTROL) 5 MG tablet Take 1 tablet (5 mg total) by mouth 2 (two) times daily before a meal. 01/19/13  Yes Antonieta Iba, MD  hydrochlorothiazide (HYDRODIURIL) 25 MG tablet TAKE 2 TABLETS BY MOUTH EVERY  DAY 08/16/13  Yes Antonieta Iba, MD  metoprolol tartrate (LOPRESSOR) 25 MG tablet Take 1 tablet (25 mg total) by mouth 2 (two) times daily. 01/19/13  Yes Antonieta Iba, MD  Rivaroxaban (XARELTO) 20 MG TABS Take 20 mg by mouth daily.   Yes Historical Provider, MD  insulin detemir (LEVEMIR) 100 unit/ml SOLN Inject 30 Units into the skin daily at 10 pm. Patient not taking: Reported on 06/11/2015 01/19/13   Antonieta Iba, MD    Anti-infectives    Start     Dose/Rate Route Frequency Ordered Stop   06/13/15 1400  vancomycin (VANCOCIN) 1,500 mg in sodium chloride 0.9 % 500 mL IVPB     1,500 mg 250 mL/hr over 120 Minutes Intravenous Every 18 hours 06/13/15 0810     06/11/15 1930  vancomycin (VANCOCIN) 1,500 mg in sodium chloride 0.9 % 500 mL IVPB  Status:  Discontinued     1,500 mg 250 mL/hr over 120 Minutes Intravenous Every 12 hours 06/11/15 1859 06/13/15 0742   06/11/15 1700  vancomycin (VANCOCIN) 1,500 mg in sodium chloride 0.9 % 500 mL IVPB  Status:  Discontinued     1,500 mg 250 mL/hr over 120 Minutes Intravenous Every 12  hours 06/11/15 1319 06/11/15 1320   06/11/15 1321  vancomycin (VANCOCIN) 1,250 mg in sodium chloride 0.9 % 250 mL IVPB  Status:  Discontinued     1,250 mg 166.7 mL/hr over 90 Minutes Intravenous Every 12 hours PRN 06/11/15 1321 06/11/15 1859   06/11/15 1319  vancomycin (VANCOCIN) 1,500 mg in sodium chloride 0.9 % 500 mL IVPB  Status:  Discontinued     1,500 mg 250 mL/hr over 120 Minutes Intravenous Every 12 hours PRN 06/11/15 1320 06/11/15 1321   06/10/15 1200  vancomycin (VANCOCIN) 1,250 mg in sodium chloride 0.9 % 250 mL IVPB  Status:  Discontinued     1,250 mg 166.7 mL/hr over 90 Minutes Intravenous Every 8 hours 06/10/15 0857 06/11/15 1312   06/10/15 0400  vancomycin (VANCOCIN) 1,500 mg in sodium chloride 0.9 % 500 mL IVPB  Status:  Discontinued     1,500 mg 250 mL/hr over 120 Minutes Intravenous Every 12 hours 06/09/15 1517 06/09/15 1716   06/10/15 0300  vancomycin (VANCOCIN) 1,500 mg in sodium chloride 0.9 % 500 mL IVPB  Status:  Discontinued     1,500 mg 250 mL/hr over 120 Minutes Intravenous Every 12 hours 06/09/15 1801 06/10/15 0854   06/09/15 1545  vancomycin (VANCOCIN) 2,000 mg in sodium chloride 0.9 % 500 mL IVPB  Status:  Discontinued     2,000 mg 250 mL/hr over 120 Minutes Intravenous  Once 06/09/15 1532 06/09/15 1535   06/09/15 1545  vancomycin (VANCOCIN) 1,500 mg in sodium chloride 0.9 % 500 mL IVPB     1,500 mg 250 mL/hr over 120 Minutes Intravenous  Once 06/09/15 1541 06/09/15 2332   06/09/15 1515  vancomycin (VANCOCIN) 2,500 mg in sodium chloride 0.9 % 500 mL IVPB  Status:  Discontinued     2,500 mg 250 mL/hr over 120 Minutes Intravenous  Once 06/09/15 1511 06/09/15 1531   06/09/15 1515  vancomycin (VANCOCIN) 1,500 mg in sodium chloride 0.9 % 500 mL IVPB  Status:  Discontinued     1,500 mg 250 mL/hr over 120 Minutes Intravenous Every 12 hours 06/09/15 1511 06/09/15 1517   06/09/15 1515  piperacillin-tazobactam (ZOSYN) IVPB 3.375 g     3.375 g 12.5 mL/hr over 240  Minutes Intravenous 3 times per day 06/09/15 1511  06/09/15 1315  vancomycin (VANCOCIN) IVPB 1000 mg/200 mL premix  Status:  Discontinued     1,000 mg 200 mL/hr over 60 Minutes Intravenous Every 24 hours 06/09/15 1306 06/09/15 1511   06/09/15 1315  piperacillin-tazobactam (ZOSYN) IVPB 3.375 g  Status:  Discontinued     3.375 g 100 mL/hr over 30 Minutes Intravenous 4 times per day 06/09/15 1306 06/09/15 1511      Scheduled Meds: . enalapril  20 mg Oral BID  . glipiZIDE  10 mg Oral BID AC  . hydrALAZINE  100 mg Oral BID  . Influenza vac split quadrivalent PF  0.5 mL Intramuscular Tomorrow-1000  . insulin aspart  0-15 Units Subcutaneous TID WC  . insulin aspart  0-5 Units Subcutaneous QHS  . metoprolol succinate  25 mg Oral Daily  . piperacillin-tazobactam (ZOSYN)  IV  3.375 g Intravenous 3 times per day  . sodium chloride  3 mL Intravenous Q12H  . vancomycin  1,500 mg Intravenous Q18H   Continuous Infusions:  PRN Meds:.sodium chloride, acetaminophen **OR** acetaminophen, fentaNYL (SUBLIMAZE) injection, morphine injection, ondansetron (ZOFRAN) IV, oxyCODONE-acetaminophen, promethazine, senna-docusate, sodium chloride, traZODone  No Known Allergies  Physical Exam  Vitals  Blood pressure 158/76, pulse 67, temperature 97.8 F (36.6 C), temperature source Oral, resp. rate 18, height  (1.905 m), weight 142.883 kg (315 lb), SpO2 97 %.  Lower Extremity exam:  Vascular: Diminished pulses bilaterally  Dermatological: Bandages on foot and is stable as per Dr. Alberteen Spindle  Neurological: Peripheral neuropathy  Ortho: Status post amputation left great toe  Data Review  CBC  Recent Labs Lab 06/09/15 1311 06/10/15 0424 06/12/15 0518  WBC 12.9* 11.2* 11.7*  HGB 12.9* 12.1* 11.1*  HCT 38.5* 35.4* 33.0*  PLT 290 239 298  MCV 86.4 87.1 86.6  MCH 29.0 29.8 29.1  MCHC 33.6 34.2 33.6  RDW 13.3 13.8 13.6  LYMPHSABS 0.9*  --  1.3  MONOABS 1.9*  --  1.3*  EOSABS 0.1  --  0.6    BASOSABS 0.0  --  0.1   ------------------------------------------------------------------------------------------------------------------  Chemistries   Recent Labs Lab 06/09/15 1311 06/10/15 0424 06/11/15 0418 06/12/15 0518 06/14/15 0516  NA 130* 133*  --   --   --   K 4.0 3.9  --   --   --   CL 97* 102  --   --   --   CO2 22 25  --   --   --   GLUCOSE 222* 211*  --   --   --   BUN 32* 25*  --   --   --   CREATININE 1.51* 1.22 1.20 1.16 1.12  CALCIUM 8.8* 8.4*  --   --   --   AST 23  --   --   --   --   ALT 27  --   --   --   --   ALKPHOS 66  --   --   --   --   BILITOT 0.8  --   --   --   --    ------------------------------------------------------------------------------------------------------------------ estimated creatinine clearance is 105.7 mL/min (by C-G formula based on Cr of 1.12). ------------------------------------------------------------------------------------------------------------------ No results for input(s): TSH, T4TOTAL, T3FREE, THYROIDAB in the last 72 hours.  Invalid input(s): FREET3   Coagulation profile No results for input(s): INR, PROTIME in the last 168 hours. ------------------------------------------------------------------------------------------------------------------- No results for input(s): DDIMER in the last 72 hours. -------------------------------------------------------------------------------------------------------------------  Cardiac Enzymes No results for input(s): CKMB, TROPONINI,  MYOGLOBIN in the last 168 hours.  Invalid input(s): CK ------------------------------------------------------------------------------------------------------------------ Invalid input(s): POCBNP   ---------------------------------------------------------------------------------------------------------------  Urinalysis    Component Value Date/Time   COLORURINE Amber 12/19/2012 1520   APPEARANCEUR Cloudy 12/19/2012 1520   LABSPEC 1.020  12/19/2012 1520   PHURINE 5.0 12/19/2012 1520   GLUCOSEU >=500 12/19/2012 1520   HGBUR 2+ 12/19/2012 1520   BILIRUBINUR Negative 12/19/2012 1520   KETONESUR Trace 12/19/2012 1520   PROTEINUR 100 mg/dL 62/13/0865 7846   NITRITE Negative 12/19/2012 1520   LEUKOCYTESUR 3+ 12/19/2012 1520     Imaging results:   Dg Wrist Complete Left  06/13/2015   CLINICAL DATA:  Left wrist pain x 2 days. No prev surg to wrist.  EXAM: LEFT WRIST - COMPLETE 3+ VIEW  COMPARISON:  None.  FINDINGS: There is degenerative change in the first carpometacarpal joint. No acute fracture or subluxation. Vascular calcifications are present.  IMPRESSION: No evidence for acute  abnormality.   Electronically Signed   By: Norva Pavlov M.D.   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 Spoke with the hospitalist and we will discharge him today on oral antibiotics. I emphasized the need for him to utilize his postop shoe and not walk without it he is to follow up with Dr.Cline next week. Tried to limit activity until he sees Dr.Cline.      Thank you for the consult, we will follow the patient with you in the Hospital.   Epimenio Sarin M.D on 06/14/2015 at 9:38 AM  Thank you for the consult, we will follow the patient with you in the Hospital.

## 2015-06-14 NOTE — Discharge Instructions (Signed)
°  DIET:  Diabetic diet, low sodium diet  DISCHARGE CONDITION:  Stable  ACTIVITY:  Light activity, must wear shoe when standing or walking  OXYGEN:  Home Oxygen: No.   Oxygen Delivery: room air  DISCHARGE LOCATION:  home    ADDITIONAL DISCHARGE INSTRUCTION: Leave dressing dry and intact   If you experience worsening of your admission symptoms, develop shortness of breath, life threatening emergency, suicidal or homicidal thoughts you must seek medical attention immediately by calling 911 or calling your MD immediately  if symptoms less severe.  You Must read complete instructions/literature along with all the possible adverse reactions/side effects for all the Medicines you take and that have been prescribed to you. Take any new Medicines after you have completely understood and accpet all the possible adverse reactions/side effects.   Please note  You were cared for by a hospitalist during your hospital stay. If you have any questions about your discharge medications or the care you received while you were in the hospital after you are discharged, you can call the unit and asked to speak with the hospitalist on call if the hospitalist that took care of you is not available. Once you are discharged, your primary care physician will handle any further medical issues. Please note that NO REFILLS for any discharge medications will be authorized once you are discharged, as it is imperative that you return to your primary care physician (or establish a relationship with a primary care physician if you do not have one) for your aftercare needs so that they can reassess your need for medications and monitor your lab values.

## 2015-06-16 LAB — CULTURE, BLOOD (ROUTINE X 2)
Culture: NO GROWTH
Culture: NO GROWTH

## 2015-06-17 LAB — GLUCOSE, CAPILLARY
Glucose-Capillary: 171 mg/dL — ABNORMAL HIGH (ref 65–99)
Glucose-Capillary: 222 mg/dL — ABNORMAL HIGH (ref 65–99)

## 2015-06-18 LAB — ANAEROBIC CULTURE: CULTURE: UNDETERMINED

## 2016-08-18 ENCOUNTER — Encounter: Payer: Self-pay | Admitting: *Deleted

## 2016-08-18 ENCOUNTER — Inpatient Hospital Stay: Payer: Commercial Managed Care - HMO | Admitting: Anesthesiology

## 2016-08-18 ENCOUNTER — Inpatient Hospital Stay
Admission: AD | Admit: 2016-08-18 | Discharge: 2016-08-21 | DRG: 623 | Disposition: A | Discharge status: Home / Self Care | Payer: Commercial Managed Care - HMO | Source: Ambulatory Visit | Attending: Internal Medicine | Admitting: Internal Medicine

## 2016-08-18 ENCOUNTER — Encounter
Admission: AD | Disposition: A | Discharge status: Home / Self Care | Payer: Self-pay | Source: Ambulatory Visit | Attending: Internal Medicine

## 2016-08-18 DIAGNOSIS — M869 Osteomyelitis, unspecified: Secondary | ICD-10-CM | POA: Diagnosis present

## 2016-08-18 DIAGNOSIS — I509 Heart failure, unspecified: Secondary | ICD-10-CM | POA: Diagnosis present

## 2016-08-18 DIAGNOSIS — Z9111 Patient's noncompliance with dietary regimen: Secondary | ICD-10-CM | POA: Diagnosis not present

## 2016-08-18 DIAGNOSIS — Z9114 Patient's other noncompliance with medication regimen: Secondary | ICD-10-CM | POA: Diagnosis not present

## 2016-08-18 DIAGNOSIS — Z79899 Other long term (current) drug therapy: Secondary | ICD-10-CM

## 2016-08-18 DIAGNOSIS — L97519 Non-pressure chronic ulcer of other part of right foot with unspecified severity: Secondary | ICD-10-CM | POA: Diagnosis present

## 2016-08-18 DIAGNOSIS — B965 Pseudomonas (aeruginosa) (mallei) (pseudomallei) as the cause of diseases classified elsewhere: Secondary | ICD-10-CM | POA: Diagnosis present

## 2016-08-18 DIAGNOSIS — L02619 Cutaneous abscess of unspecified foot: Secondary | ICD-10-CM | POA: Diagnosis present

## 2016-08-18 DIAGNOSIS — E1140 Type 2 diabetes mellitus with diabetic neuropathy, unspecified: Secondary | ICD-10-CM | POA: Diagnosis present

## 2016-08-18 DIAGNOSIS — L97509 Non-pressure chronic ulcer of other part of unspecified foot with unspecified severity: Secondary | ICD-10-CM

## 2016-08-18 DIAGNOSIS — N179 Acute kidney failure, unspecified: Secondary | ICD-10-CM | POA: Diagnosis present

## 2016-08-18 DIAGNOSIS — R262 Difficulty in walking, not elsewhere classified: Secondary | ICD-10-CM

## 2016-08-18 DIAGNOSIS — Z89412 Acquired absence of left great toe: Secondary | ICD-10-CM

## 2016-08-18 DIAGNOSIS — E1165 Type 2 diabetes mellitus with hyperglycemia: Secondary | ICD-10-CM | POA: Diagnosis present

## 2016-08-18 DIAGNOSIS — Z9119 Patient's noncompliance with other medical treatment and regimen: Secondary | ICD-10-CM | POA: Diagnosis present

## 2016-08-18 DIAGNOSIS — E11621 Type 2 diabetes mellitus with foot ulcer: Secondary | ICD-10-CM | POA: Diagnosis present

## 2016-08-18 DIAGNOSIS — I110 Hypertensive heart disease with heart failure: Secondary | ICD-10-CM | POA: Diagnosis present

## 2016-08-18 DIAGNOSIS — I480 Paroxysmal atrial fibrillation: Secondary | ICD-10-CM | POA: Diagnosis present

## 2016-08-18 DIAGNOSIS — Z8249 Family history of ischemic heart disease and other diseases of the circulatory system: Secondary | ICD-10-CM | POA: Diagnosis not present

## 2016-08-18 DIAGNOSIS — E1169 Type 2 diabetes mellitus with other specified complication: Principal | ICD-10-CM | POA: Diagnosis present

## 2016-08-18 DIAGNOSIS — I452 Bifascicular block: Secondary | ICD-10-CM | POA: Diagnosis present

## 2016-08-18 HISTORY — PX: IRRIGATION AND DEBRIDEMENT FOOT: SHX6602

## 2016-08-18 LAB — BASIC METABOLIC PANEL
Anion gap: 8 (ref 5–15)
BUN: 29 mg/dL — ABNORMAL HIGH (ref 6–20)
CHLORIDE: 100 mmol/L — AB (ref 101–111)
CO2: 25 mmol/L (ref 22–32)
CREATININE: 1.61 mg/dL — AB (ref 0.61–1.24)
Calcium: 8.9 mg/dL (ref 8.9–10.3)
GFR calc non Af Amer: 44 mL/min — ABNORMAL LOW (ref >60)
GFR, EST AFRICAN AMERICAN: 51 mL/min — AB (ref >60)
Glucose, Bld: 357 mg/dL — ABNORMAL HIGH (ref 65–99)
POTASSIUM: 4.2 mmol/L (ref 3.5–5.1)
SODIUM: 133 mmol/L — AB (ref 135–145)

## 2016-08-18 LAB — GLUCOSE, CAPILLARY
GLUCOSE-CAPILLARY: 209 mg/dL — AB (ref 65–99)
Glucose-Capillary: 300 mg/dL — ABNORMAL HIGH (ref 65–99)
Glucose-Capillary: 225 mg/dL — ABNORMAL HIGH (ref 65–99)
Glucose-Capillary: 289 mg/dL — ABNORMAL HIGH (ref 65–99)

## 2016-08-18 LAB — CBC
HEMATOCRIT: 40.5 % (ref 40.0–52.0)
Hemoglobin: 13.7 g/dL (ref 13.0–18.0)
MCH: 30.4 pg (ref 26.0–34.0)
MCHC: 33.8 g/dL (ref 32.0–36.0)
MCV: 90.1 fL (ref 80.0–100.0)
PLATELETS: 321 10*3/uL (ref 150–440)
RBC: 4.49 MIL/uL (ref 4.40–5.90)
RDW: 14.4 % (ref 11.5–14.5)
WBC: 9.7 10*3/uL (ref 3.8–10.6)

## 2016-08-18 LAB — PROTIME-INR
INR: 1.14
Prothrombin Time: 14.7 seconds (ref 11.4–15.2)

## 2016-08-18 LAB — SURGICAL PCR SCREEN
MRSA, PCR: NEGATIVE
Staphylococcus aureus: NEGATIVE

## 2016-08-18 SURGERY — IRRIGATION AND DEBRIDEMENT FOOT
Anesthesia: General | Site: Foot | Laterality: Right | Wound class: Dirty or Infected

## 2016-08-18 MED ORDER — INSULIN ASPART 100 UNIT/ML ~~LOC~~ SOLN
0.0000 [IU] | Freq: Three times a day (TID) | SUBCUTANEOUS | Status: DC
  Administered 2016-08-18: 8 [IU] via SUBCUTANEOUS
  Administered 2016-08-18: 5 [IU] via SUBCUTANEOUS
  Filled 2016-08-18: qty 5
  Filled 2016-08-18: qty 8

## 2016-08-18 MED ORDER — GLIPIZIDE 10 MG PO TABS
10.0000 mg | ORAL_TABLET | Freq: Two times a day (BID) | ORAL | Status: DC
  Administered 2016-08-18 – 2016-08-21 (×6): 10 mg via ORAL
  Filled 2016-08-18 (×6): qty 1

## 2016-08-18 MED ORDER — OXYCODONE-ACETAMINOPHEN 5-325 MG PO TABS
1.0000 | ORAL_TABLET | ORAL | Status: DC | PRN
  Administered 2016-08-19: 1 via ORAL
  Filled 2016-08-18: qty 1

## 2016-08-18 MED ORDER — ONDANSETRON HCL 4 MG/2ML IJ SOLN: 4.0000 mg | Freq: Once | INTRAMUSCULAR | Status: DC | PRN

## 2016-08-18 MED ORDER — BUPIVACAINE HCL (PF) 0.5 % IJ SOLN
INTRAMUSCULAR | Status: DC | PRN
  Administered 2016-08-18: 30 mL

## 2016-08-18 MED ORDER — PIPERACILLIN-TAZOBACTAM 3.375 G IVPB
3.3750 g | Freq: Once | INTRAVENOUS | Status: AC
  Administered 2016-08-18: 3.375 g via INTRAVENOUS
  Filled 2016-08-18: qty 50

## 2016-08-18 MED ORDER — NEOMYCIN-POLYMYXIN B GU 40-200000 IR SOLN
Status: DC | PRN
  Administered 2016-08-18: 2 mL

## 2016-08-18 MED ORDER — ONDANSETRON HCL 4 MG PO TABS: 4.0000 mg | ORAL_TABLET | Freq: Four times a day (QID) | ORAL | Status: DC | PRN

## 2016-08-18 MED ORDER — ENOXAPARIN SODIUM 40 MG/0.4ML ~~LOC~~ SOLN
40.0000 mg | SUBCUTANEOUS | Status: DC
  Filled 2016-08-18 (×3): qty 0.4

## 2016-08-18 MED ORDER — PHENYLEPHRINE HCL 10 MG/ML IJ SOLN
INTRAMUSCULAR | Status: DC | PRN
  Administered 2016-08-18 (×3): 100 ug via INTRAVENOUS
  Administered 2016-08-18 (×2): 200 ug via INTRAVENOUS
  Administered 2016-08-18: 100 ug via INTRAVENOUS

## 2016-08-18 MED ORDER — PROPOFOL 10 MG/ML IV BOLUS
INTRAVENOUS | Status: DC | PRN
  Administered 2016-08-18: 200 mg via INTRAVENOUS

## 2016-08-18 MED ORDER — ACETAMINOPHEN 650 MG RE SUPP: 650.0000 mg | Freq: Four times a day (QID) | RECTAL | Status: DC | PRN

## 2016-08-18 MED ORDER — GLYCOPYRROLATE 0.2 MG/ML IJ SOLN
INTRAMUSCULAR | Status: DC | PRN
  Administered 2016-08-18: 0.2 mg via INTRAVENOUS

## 2016-08-18 MED ORDER — KETAMINE HCL 50 MG/ML IJ SOLN
INTRAMUSCULAR | Status: DC | PRN
  Administered 2016-08-18: 25 mg via INTRAVENOUS

## 2016-08-18 MED ORDER — GENTAMICIN SULFATE 40 MG/ML IJ SOLN
INTRAMUSCULAR | Status: DC | PRN
  Administered 2016-08-18: 32088 mg via INTRAMUSCULAR

## 2016-08-18 MED ORDER — SODIUM CHLORIDE 0.9 % IV SOLN
INTRAVENOUS | Status: DC | PRN
  Administered 2016-08-18 (×2): via INTRAVENOUS

## 2016-08-18 MED ORDER — ONDANSETRON HCL 4 MG/2ML IJ SOLN: 4.0000 mg | Freq: Four times a day (QID) | INTRAMUSCULAR | Status: DC | PRN

## 2016-08-18 MED ORDER — MORPHINE SULFATE (PF) 4 MG/ML IV SOLN: 2.0000 mg | INTRAVENOUS | Status: DC | PRN

## 2016-08-18 MED ORDER — LIDOCAINE HCL (PF) 2 % IJ SOLN
INTRAMUSCULAR | Status: DC | PRN
  Administered 2016-08-18: 50 mg

## 2016-08-18 MED ORDER — ENOXAPARIN SODIUM 40 MG/0.4ML ~~LOC~~ SOLN: 40.0000 mg | SUBCUTANEOUS | Status: DC

## 2016-08-18 MED ORDER — SODIUM CHLORIDE 0.9 % IV SOLN
Freq: Once | INTRAVENOUS | Status: AC
  Administered 2016-08-18: 15:00:00 via INTRAVENOUS

## 2016-08-18 MED ORDER — VANCOMYCIN HCL 1000 MG IV SOLR
INTRAVENOUS | Status: DC | PRN
  Administered 2016-08-18: 1000 mg

## 2016-08-18 MED ORDER — ONDANSETRON HCL 4 MG/2ML IJ SOLN
INTRAMUSCULAR | Status: DC | PRN
  Administered 2016-08-18: 4 mg via INTRAVENOUS

## 2016-08-18 MED ORDER — TRAMADOL HCL 50 MG PO TABS: 50.0000 mg | ORAL_TABLET | Freq: Four times a day (QID) | ORAL | Status: DC | PRN

## 2016-08-18 MED ORDER — FENTANYL CITRATE (PF) 100 MCG/2ML IJ SOLN
INTRAMUSCULAR | Status: DC | PRN
  Administered 2016-08-18 (×2): 50 ug via INTRAVENOUS

## 2016-08-18 MED ORDER — ACETAMINOPHEN 325 MG PO TABS: 650.0000 mg | ORAL_TABLET | Freq: Four times a day (QID) | ORAL | Status: DC | PRN

## 2016-08-18 MED ORDER — INSULIN ASPART 100 UNIT/ML ~~LOC~~ SOLN
0.0000 [IU] | Freq: Three times a day (TID) | SUBCUTANEOUS | Status: DC
  Administered 2016-08-18: 8 [IU] via SUBCUTANEOUS
  Administered 2016-08-19 (×2): 5 [IU] via SUBCUTANEOUS
  Administered 2016-08-19: 3 [IU] via SUBCUTANEOUS
  Administered 2016-08-19 – 2016-08-20 (×2): 5 [IU] via SUBCUTANEOUS
  Administered 2016-08-20: 8 [IU] via SUBCUTANEOUS
  Administered 2016-08-20 (×2): 3 [IU] via SUBCUTANEOUS
  Administered 2016-08-21: 8 [IU] via SUBCUTANEOUS
  Administered 2016-08-21: 3 [IU] via SUBCUTANEOUS
  Filled 2016-08-18 (×2): qty 5
  Filled 2016-08-18 (×2): qty 3
  Filled 2016-08-18: qty 8
  Filled 2016-08-18: qty 3
  Filled 2016-08-18 (×2): qty 8
  Filled 2016-08-18 (×2): qty 5
  Filled 2016-08-18 (×2): qty 3

## 2016-08-18 MED ORDER — FENTANYL CITRATE (PF) 100 MCG/2ML IJ SOLN: 25.0000 ug | INTRAMUSCULAR | Status: DC | PRN

## 2016-08-18 MED ORDER — MIDAZOLAM HCL 5 MG/5ML IJ SOLN
INTRAMUSCULAR | Status: DC | PRN
  Administered 2016-08-18: 2 mg via INTRAVENOUS

## 2016-08-18 MED ORDER — DOCUSATE SODIUM 100 MG PO CAPS
100.0000 mg | ORAL_CAPSULE | Freq: Two times a day (BID) | ORAL | Status: DC
  Filled 2016-08-18 (×5): qty 1

## 2016-08-18 MED ORDER — PIPERACILLIN-TAZOBACTAM 3.375 G IVPB
3.3750 g | Freq: Three times a day (TID) | INTRAVENOUS | Status: DC
  Administered 2016-08-18 – 2016-08-21 (×8): 3.375 g via INTRAVENOUS
  Filled 2016-08-18 (×8): qty 50

## 2016-08-18 MED ORDER — DEXAMETHASONE SODIUM PHOSPHATE 10 MG/ML IJ SOLN
INTRAMUSCULAR | Status: DC | PRN
  Administered 2016-08-18: 4 mg via INTRAVENOUS

## 2016-08-18 SURGICAL SUPPLY — 50 items
BANDAGE ELASTIC 4 LF NS (GAUZE/BANDAGES/DRESSINGS) ×2 IMPLANT
BLADE OSC/SAGITTAL MD 5.5X18 (BLADE) ×2 IMPLANT
BLADE OSCILLATING/SAGITTAL (BLADE)
BLADE SURG 15 STRL LF DISP TIS (BLADE) ×1 IMPLANT
BLADE SURG 15 STRL SS (BLADE) ×1
BLADE SW THK.38XMED LNG THN (BLADE) IMPLANT
BNDG ESMARK 4X12 TAN STRL LF (GAUZE/BANDAGES/DRESSINGS) ×2 IMPLANT
BNDG GAUZE 4.5X4.1 6PLY STRL (MISCELLANEOUS) ×6 IMPLANT
CANISTER SUCT 1200ML W/VALVE (MISCELLANEOUS) ×2 IMPLANT
CNTNR SPEC 2.5X3XGRAD LEK (MISCELLANEOUS) ×1
CONT SPEC 4OZ STER OR WHT (MISCELLANEOUS) ×1
CONTAINER SPEC 2.5X3XGRAD LEK (MISCELLANEOUS) ×1 IMPLANT
CUFF TOURN 18 STER (MISCELLANEOUS) ×2 IMPLANT
CUFF TOURN DUAL PL 12 NO SLV (MISCELLANEOUS) IMPLANT
DRAPE FLUOR MINI C-ARM 54X84 (DRAPES) IMPLANT
DURAPREP 26ML APPLICATOR (WOUND CARE) ×2 IMPLANT
ELECT REM PT RETURN 9FT ADLT (ELECTROSURGICAL) ×2
ELECTRODE REM PT RTRN 9FT ADLT (ELECTROSURGICAL) ×1 IMPLANT
GAUZE FLUFF 18X24 1PLY STRL (GAUZE/BANDAGES/DRESSINGS) ×2 IMPLANT
GAUZE PETRO XEROFOAM 1X8 (MISCELLANEOUS) ×2 IMPLANT
GAUZE SPONGE 4X4 12PLY STRL (GAUZE/BANDAGES/DRESSINGS) ×4 IMPLANT
GAUZE STRETCH 2X75IN STRL (MISCELLANEOUS) ×2 IMPLANT
GLOVE BIO SURGEON STRL SZ7.5 (GLOVE) ×2 IMPLANT
GLOVE INDICATOR 8.0 STRL GRN (GLOVE) ×2 IMPLANT
GOWN STRL REUS W/ TWL LRG LVL3 (GOWN DISPOSABLE) ×2 IMPLANT
GOWN STRL REUS W/TWL LRG LVL3 (GOWN DISPOSABLE) ×2
HANDPIECE VERSAJET DEBRIDEMENT (MISCELLANEOUS) ×2 IMPLANT
KIT STIMULAN RAPID CURE 5CC (Orthopedic Implant) ×2 IMPLANT
LABEL OR SOLS (LABEL) ×2 IMPLANT
NEEDLE FILTER BLUNT 18X 1/2SAF (NEEDLE) ×1
NEEDLE FILTER BLUNT 18X1 1/2 (NEEDLE) ×1 IMPLANT
NEEDLE HYPO 25X1 1.5 SAFETY (NEEDLE) ×6 IMPLANT
NS IRRIG 500ML POUR BTL (IV SOLUTION) ×2 IMPLANT
PACK EXTREMITY ARMC (MISCELLANEOUS) ×2 IMPLANT
PAD ABD DERMACEA PRESS 5X9 (GAUZE/BANDAGES/DRESSINGS) ×8 IMPLANT
PENCIL ELECTRO HAND CTR (MISCELLANEOUS) ×2 IMPLANT
RASP SM TEAR CROSS CUT (RASP) ×2 IMPLANT
SOL PREP PVP 2OZ (MISCELLANEOUS) ×2
SOLUTION PREP PVP 2OZ (MISCELLANEOUS) ×1 IMPLANT
SPONGE LAP 18X18 5 PK (GAUZE/BANDAGES/DRESSINGS) ×2 IMPLANT
STOCKINETTE STRL 4IN 9604848 (GAUZE/BANDAGES/DRESSINGS) IMPLANT
STOCKINETTE STRL 6IN 960660 (GAUZE/BANDAGES/DRESSINGS) ×2 IMPLANT
SUT ETHILON 4-0 (SUTURE) ×1
SUT ETHILON 4-0 FS2 18XMFL BLK (SUTURE) ×1
SUT VIC AB 3-0 SH 27 (SUTURE) ×1
SUT VIC AB 3-0 SH 27X BRD (SUTURE) ×1 IMPLANT
SUT VIC AB 4-0 FS2 27 (SUTURE) ×2 IMPLANT
SUTURE ETHLN 4-0 FS2 18XMF BLK (SUTURE) ×1 IMPLANT
SYR 3ML LL SCALE MARK (SYRINGE) ×2 IMPLANT
SYRINGE 10CC LL (SYRINGE) ×4 IMPLANT

## 2016-08-18 NOTE — H&P (View-Only) (Signed)
Reason for Consult: Osteomyelitis right forefoot with ulceration hallux amputation site Referring Physician: Hospitalist  [name] is an 60 y.o. male.  HPI: This is a 60 year old male with chronic history of ulceration beneath his right forefoot. Been managed outpatient but recent serial x-rays revealed progressive signs of osteomyelitis in the second metatarsal. Also a full-thickness ulceration at the amputation site of his right great toe. Decision made for hospitalization and debridement of the infected bone and abscessed area on the right foot.  Past Medical History:  Diagnosis Date  . A-fib   . Acute renal failure   . Arrhythmia   . CHF (congestive heart failure)   . Diabetes   . Hypertension   . Hyponatremia   . Leukocytosis   . Systemic inflammatory response syndrome   . UTI (urinary tract infection)     Past Surgical History:  Procedure Laterality Date  . AMPUTATION TOE Left 06/10/2015   Procedure: Left great toe amputation ;  Surgeon: Linus Galasodd Naylene Foell, MD;  Location: [facility] ORS;  Service: Podiatry;  Laterality: Left;    Family History  Problem Relation Age of Onset  . Heart attack Mother     Social History:  reports that he has never smoked. He does not have any smokeless tobacco history on file. His alcohol and drug histories are not on file.  Allergies: No Known Allergies  Medications:  Scheduled: . sodium chloride   Intravenous Once  . docusate sodium  100 mg Oral BID  . enoxaparin (LOVENOX) injection  40 mg Subcutaneous Q24H  . insulin aspart  0-15 Units Subcutaneous TID WC  . piperacillin-tazobactam (ZOSYN)  IV  3.375 g Intravenous Once    No results found for this or any previous visit (from the past 48 hour(s)).  No results found.  Review of Systems  Constitutional: Negative for chills and fever.  HENT: Negative.   Eyes: Negative.   Respiratory: Negative.   Cardiovascular: Negative.   Gastrointestinal: Negative for nausea and vomiting.   Genitourinary: Negative.   Musculoskeletal: Negative.   Skin:       Chronic draining ulceration from the bottom of his right foot. Drainage has increased recently with redness and swelling.  Neurological:       Does have numbness and paresthesias related to the neuropathy from his diabetes.  Endo/Heme/Allergies: Negative.   Psychiatric/Behavioral: Negative.    Blood pressure 129/78, pulse (!) 107, temperature 98 F (36.7 C), temperature source Oral, resp. rate 18, SpO2 95 %. Physical Exam  Cardiovascular:  DP and PT pulses are palpable. Capillary filling time intact to remaining digits.  Musculoskeletal:  Adequate range of motion of the pedal joints. Previous amputation of the hallux. Muscle testing deferred  Neurological:  Loss of protective threshold monofilament wire in the forefoot and toes bilateral.  Skin:  Skin is warm dry and mildly atrophic. A full-thickness ulceration on the plantar aspect of the right forefoot beneath the second metatarsal probing all the way to bone. Some erythema and edema in the right forefoot. Also a full-thickness ulceration at the amputation site of the right hallux with some underlying devitalized and necrotic tissue.    Assessment/Plan: Assessment: 1. Osteomyelitis right second metatarsal. 2. Full-thickness ulceration with abscess hallux amputation site. 3. Diabetes with associated neuropathy. Plan: Discussed with the patient the need for debridement of the bone in the right forefoot as well as the abscessed area at his amputation site. Discussed possible risks and complications including failure to heal or continued infection as well as possible  need for further surgery or amputation. Consent form for debridement of the infected bone and ulceration area on the right foot. Patient is currently nothing by mouth and will remain that way. Plan for surgery this evening after work. For now we will hold his Lovenox until after surgery.  Raymond Avery  Raymond Avery 08/18/2016, 1:01 PM

## 2016-08-18 NOTE — Anesthesia Preprocedure Evaluation (Addendum)
Anesthesia Evaluation  Patient identified by MRN, date of birth, ID band Patient awake    Reviewed: Allergy & Precautions, NPO status , Patient's Chart, lab work & pertinent test results, reviewed documented beta blocker date and time   Airway Mallampati: III  TM Distance: >3 FB     Dental  (+) Chipped   Pulmonary           Cardiovascular hypertension, Pt. on medications and Pt. on home beta blockers +CHF       Neuro/Psych    GI/Hepatic   Endo/Other  diabetes, Type 2  Renal/GU      Musculoskeletal   Abdominal   Peds  Hematology   Anesthesia Other Findings AF. 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  Reproductive/Obstetrics                            Anesthesia Physical Anesthesia Plan  ASA: III  Anesthesia Plan: General   Post-op Pain Management:    Induction: Intravenous  Airway Management Planned: LMA  Additional Equipment:   Intra-op Plan:   Post-operative Plan:   Informed Consent: I have reviewed the patients History and Physical, chart, labs and discussed the procedure including the risks, benefits and alternatives for the proposed anesthesia with the patient or authorized representative who has indicated his/her understanding and acceptance.     Plan Discussed with: CRNA  Anesthesia Plan Comments:         Anesthesia Quick Evaluation

## 2016-08-18 NOTE — Op Note (Signed)
Date of operation: 08/18/2016.  Surgeon: Ricci Barkerodd W Gl[place] Youngblood DPM.  Preoperative diagnosis: 1. Osteomyelitis right second metatarsal. 2. Abscess right forefoot.  Postoperative diagnosis: Same.  Procedures: 1. Debridement of bone and joint right second metatarsophalangeal joint. 2. Incision with excisional debridement abscess right hallux amputation site.  Anesthesia: LMA with local.  Hemostasis: Pneumatic tourniquet right ankle 250 mmHg.  Estimated blood loss: 50 cc.  Pathology: Bone and joint right second metatarsal.  Cultures: 1. Bone culture right second metatarsal. 2. Deep wound first metatarsal area.  Implants: Stimulan rapid cure antibiotic beads impregnated with vancomycin and gentamicin.  Consultations: None apparent.  Operative indications: This is a 60 year old male with a chronic history of ulceration beneath his right forefoot. Serial x-rays showed evidence for osteomyelitis and decision was made for admission to the hospital with IV antibiotics and surgical debridement.  Operative procedure: Patient was taken to the operating room and placed on the table in supine position. Following satisfactory LMA anesthesia a tourniquet was applied at the level of the right ankle and the foot was prepped and draped in usual sterile fashion. Foot was exsanguinated and the tourniquet inflated to 250 mmHg. Attention was then directed to the dorsal aspect of the right forefoot where an approximate 5 cm linear incision was made coursing proximal to distal over the second metatarsal and metatarsophalangeal joint. The incision was deepened via sharp and blunt dissection down to the level of the joint. A bleeder was encountered the proximal aspect of the incision and this was identified and freed from the surrounding tissues and ligated using a 3-0 Vicryl reel. The incision was carried down to the level of the second metatarsal and metatarsophalangeal joint where the soft tissues were freed from the medial  and lateral aspect. Using a sagittal saw the base of the proximal phalanx and the distal aspect of the second metatarsal were transected and the second metatarsal and base of the proximal phalanx were removed in toto. Some mild surrounding devitalized tissue was noted and this was debrided with a versa jet debrider on a setting of 6. Good healthy bleeding tissues were noted following the debridement. There was communication with the plantar ulceration and this also was debrided with a versa jet debrider on a setting of 6 connecting the 2 areas. At this point an elliptical incision was made over the distal aspect of the right hallux amputation site encompassing the open ulceration. The skin wedge was resected and there was revealed a significant abscess formation with some devitalized and necrotic tissue. Excisional debridement was performed using a 15 blade as well as a ronguer for all layers down to the deep subcutaneous tissue. The remaining tissues were debrided with following debridement there was noted be good healthy bleeding tissues. a versa jet debrider and there was extension noted communicating laterally to the second metatarsal area as well as an area extending plantarly connecting with the plantar ulceration. Following debridement there was noted to be good healthy granular tissues. The wounds were then flushed with the amount of sterile saline and Stimulan rapid cure antibiotic beads impregnated with vancomycin and gentamicin were then placed into the first metatarsal ulcerative area and the incision was closed using 3-0 nylon simple interrupted sutures. One suture was placed across the plantar ulceration to prevent the beads from coming out at this point but still open enough for drainage. The remainder of the beads were then placed in the second metatarsal area and this was closed with 3-0 nylon suture with vertical mattress and  simple interrupted sutures. Xeroform 4 x 4's fluffs ABDs Kerlix were  then applied to the right lower extremity. The tourniquet was released. An Ace wrap was applied for compression. Patient tolerated the procedure and anesthesia well and was transported to PACU with vital signs stable and in good condition.

## 2016-08-18 NOTE — Transfer of Care (Signed)
Immediate Anesthesia Transfer of Care Note  Patient: Raymond Avery  Procedure(s) Performed: Procedure(s): IRRIGATION AND DEBRIDEMENT FOOT ABSCESS AND BONE (Right)  Patient Location: PACU  Anesthesia Type:General  Level of Consciousness: sedated  Airway & Oxygen Therapy: Patient Spontanous Breathing and Patient connected to face mask oxygen  Post-op Assessment: Report given to RN and Post -op Vital signs reviewed and stable  Post vital signs: Reviewed and stable  Last Vitals:  Vitals:   08/18/16 1233 08/18/16 1806  BP: 129/78 121/76  Pulse: (!) 107 100  Resp: 18 (!) 26  Temp: 36.7 C 36.4 C    Last Pain:  Vitals:   08/18/16 1233  TempSrc: Oral         Complications: No apparent anesthesia complications

## 2016-08-18 NOTE — Progress Notes (Signed)
Pharmacy Antibiotic Note  Raymond Raymond Avery is a 60 y.o. male admitted on 08/18/2016 with wound infection.  Pharmacy has been consulted for piperacillin/tazobactam dosing.  Plan: Piperacillin/tazobactam 3.375 g IV q8h EI  Weight: 294 lb 12.8 oz (133.7 kg)  Temp (24hrs), Avg:98 F (36.7 C), Min:98 F (36.7 C), Max:98 F (36.7 C)   Recent Labs Lab 08/18/16 1250  WBC 9.7  CREATININE 1.61*    CrCl cannot be calculated (Unknown ideal weight.).    No Known Allergies  Antimicrobials this admission: Piperacillin/tazobactam 11/29 >>   Dose adjustments this admission:  Microbiology results: 11/29 MRSA PCR: Sent  Thank you for allowing pharmacy to be a part of this patient's care.  Raymond Avery, PharmD, BCPS Clinical Pharmacist 08/18/2016 3:28 PM

## 2016-08-18 NOTE — H&P (Signed)
[facility] [place] [facility]   PATIENT NAME: Raymond AbtsJohn Hippler    MR#:  [id number]  DATE OF BIRTH:  04/10/1956  DATE OF ADMISSION:  08/18/2016  PRIMARY CARE PHYSICIAN: Rayetta HumphreyGeorge, Sionne A, MD   REQUESTING/REFERRING PHYSICIAN: Dr Alberteen Spindleline  CHIEF COMPLAINT:  Progressive worsening right foot infection with uncontrolled diabetes  HISTORY OF PRESENT ILLNESS:  Raymond Avery  is a 60 y.o. male with a known history of Uncontrolled diabetes with medical noncompliance, hypertension, history of Chf comes from Dr. Dory Larsenline's office with worsening diabetic foot infection. Patient has been noncompliant with diet and medication along with follow-up with primary care physician with his meds. He has not taken his by mouth diabetic meds for 6-8 months. He is declining to use any insulin at home.   patient is admitted for debridement and further evaluation of his diabetic foot ulcer. Dr. Graciela HusbandsKlein has been consulted  PAST MEDICAL HISTORY:   Past Medical History:  Diagnosis Date  . A-fib   . Acute renal failure   . Arrhythmia   . CHF (congestive heart failure)   . Diabetes   . Hypertension   . Hyponatremia   . Leukocytosis   . Systemic inflammatory response syndrome   . UTI (urinary tract infection)     PAST SURGICAL HISTOIRY:   Past Surgical History:  Procedure Laterality Date  . AMPUTATION TOE Left 06/10/2015   Procedure: Left great toe amputation ;  Surgeon: Linus Galasodd Cline, MD;  Location: [facility] ORS;  Service: Podiatry;  Laterality: Left;    SOCIAL HISTORY:   Social History  Substance Use Topics  . Smoking status: Never Smoker  . Smokeless tobacco: Not on file  . Alcohol use Not on file    FAMILY HISTORY:   Family History  Problem Relation Age of Onset  . Heart attack Mother     DRUG ALLERGIES:  No Known Allergies  REVIEW OF SYSTEMS:  Review of Systems  Constitutional: Negative for chills, fever and weight loss.  HENT: Negative for ear discharge, ear pain and  nosebleeds.   Eyes: Negative for blurred vision, pain and discharge.  Respiratory: Negative for sputum production, shortness of breath, wheezing and stridor.   Cardiovascular: Negative for chest pain, palpitations, orthopnea and PND.  Gastrointestinal: Negative for abdominal pain, diarrhea, nausea and vomiting.  Genitourinary: Negative for frequency and urgency.  Musculoskeletal: Positive for joint pain. Negative for back pain.  Neurological: Positive for weakness. Negative for sensory change, speech change and focal weakness.  Psychiatric/Behavioral: Negative for depression and hallucinations. The patient is not nervous/anxious.      MEDICATIONS AT HOME:   Prior to Admission medications   Medication Sig Start Date End Date Taking? Authorizing Provider  amLODipine (NORVASC) 10 MG tablet Take 10 mg by mouth daily.   Yes Historical Provider, MD  hydrALAZINE (APRESOLINE) 100 MG tablet Take 100 mg by mouth daily. 05/19/16  Yes Historical Provider, MD  hydrochlorothiazide (HYDRODIURIL) 25 MG tablet TAKE 2 TABLETS BY MOUTH EVERY DAY 08/16/13  Yes Antonieta Ibaimothy J Gollan, MD  metoprolol tartrate (LOPRESSOR) 25 MG tablet Take 1 tablet (25 mg total) by mouth 2 (two) times daily. 01/19/13  Yes Antonieta Ibaimothy J Gollan, MD      VITAL SIGNS:  Blood pressure 129/78, pulse (!) 107, temperature 98 F (36.7 C), temperature source Oral, resp. rate 18, weight 133.7 kg (294 lb 12.8 oz), SpO2 95 %.  PHYSICAL EXAMINATION:  GENERAL:  60 y.o.-year-old patient lying in the bed with no acute distress.  EYES: Pupils equal, round, reactive to light and accommodation. No scleral icterus. Extraocular muscles intact.  HEENT: Head atraumatic, normocephalic. Oropharynx and nasopharynx clear.  NECK:  Supple, no jugular venous distention. No thyroid enlargement, no tenderness.  LUNGS: Normal breath sounds bilaterally, no wheezing, rales,rhonchi or crepitation. No use of accessory muscles of respiration.  CARDIOVASCULAR: S1, S2 normal.  No murmurs, rubs, or gallops.  ABDOMEN: Soft, nontender, nondistended. Bowel sounds present. No organomegaly or mass.  EXTREMITIES: No pedal edema, cyanosis, or clubbing. Right foot diabetic foot ulcer. Dressing present. NEUROLOGIC: Cranial nerves II through XII are intact. Muscle strength 5/5 in all extremities. Sensation intact. Gait not checked.  PSYCHIATRIC: The patient is alert and oriented x 3.  SKIN: No obvious rash, lesion, or ulcer.   LABORATORY PANEL:   CBC  Recent Labs Lab 08/18/16 1250  WBC 9.7  HGB 13.7  HCT 40.5  PLT 321   ------------------------------------------------------------------------------------------------------------------  Chemistries   Recent Labs Lab 08/18/16 1250  NA 133*  K 4.2  CL 100*  CO2 25  GLUCOSE 357*  BUN 29*  CREATININE 1.61*  CALCIUM 8.9   ------------------------------------------------------------------------------------------------------------------  Cardiac Enzymes No results for input(s): TROPONINI in the last 168 hours. ------------------------------------------------------------------------------------------------------------------  RADIOLOGY:  No results found.  EKG:    IMPRESSION AND PLAN:    Raymond Abtsjohn Sawaya  is a 60 y.o. male with a known history of Uncontrolled diabetes with medical noncompliance, hypertension, history of Chf comes from Dr. Dory Larsenline's office with worsening diabetic foot infection. Patient has been noncompliant with diet and medication along with follow-up with primary care physician with his meds.  1. Right foot nonhealing diabetic foot ulcer -Admit to medical floor -Nothing by mouth -Dr. Alberteen Spindleline consulted. Patient to go to or today for debridement -IV Zosyn  -Follow up wound cultures  2. Uncontrolled type 2 diabetes -Patient noncompliant with meds and diet. He has not taken his glipizide for 6-8 months. He is not keen on starting anything but glipizide. He declines use of insulin as well. Dr.  Alberteen Spindleline aware -We'll start patient on glipizide continue sliding scale  3. Hypertension -Continue amlodipine and hydralazine, hydrochlorothiazide, metoprolol  4. Acute renal failure -Baseline creatinine around 1.1 -We'll give some IV fluids avoid nephrotoxins monitor creatinine  5.  DVT prophylaxis Lovenox   All the records are reviewed and case discussed with ED provider. Management plans discussed with the patient, family and they are in agreement.  CODE STATUS: Full  TOTAL TIME TAKING CARE OF THIS PATIENT:  45 minutes.    Denia Mcvicar M.D on 08/18/2016 at 3:04 PM  Between 7am to 6pm - Pager - [phone]  After 6pm go to [email or web address] - password [email or web address]  [facility]  Office  [phone]  CC: Primary care physician; Rayetta HumphreyGeorge, Sionne A, MD

## 2016-08-18 NOTE — Consult Note (Signed)
Reason for Consult: Osteomyelitis right forefoot with ulceration hallux amputation site Referring Physician: Hospitalist  Raymond Avery is an 60 y.o. male.  HPI: This is a 58-year-old male with chronic history of ulceration beneath his right forefoot. Been managed outpatient but recent serial x-rays revealed progressive signs of osteomyelitis in the second metatarsal. Also a full-thickness ulceration at the amputation site of his right great toe. Decision made for hospitalization and debridement of the infected bone and abscessed area on the right foot.  Past Medical History:  Diagnosis Date  . A-fib   . Acute renal failure   . Arrhythmia   . CHF (congestive heart failure)   . Diabetes   . Hypertension   . Hyponatremia   . Leukocytosis   . Systemic inflammatory response syndrome   . UTI (urinary tract infection)     Past Surgical History:  Procedure Laterality Date  . AMPUTATION TOE Left 06/10/2015   Procedure: Left great toe amputation ;  Surgeon: Raymond Wisser, MD;  Location: [facility] ORS;  Service: Podiatry;  Laterality: Left;    Family History  Problem Relation Age of Onset  . Heart attack Mother     Social History:  reports that he has never smoked. He does not have any smokeless tobacco history on file. His alcohol and drug histories are not on file.  Allergies: No Known Allergies  Medications:  Scheduled: . sodium chloride   Intravenous Once  . docusate sodium  100 mg Oral BID  . enoxaparin (LOVENOX) injection  40 mg Subcutaneous Q24H  . insulin aspart  0-15 Units Subcutaneous TID WC  . piperacillin-tazobactam (ZOSYN)  IV  3.375 g Intravenous Once    No results found for this or any previous visit (from the past 48 hour(s)).  No results found.  Review of Systems  Constitutional: Negative for chills and fever.  HENT: Negative.   Eyes: Negative.   Respiratory: Negative.   Cardiovascular: Negative.   Gastrointestinal: Negative for nausea and vomiting.   Genitourinary: Negative.   Musculoskeletal: Negative.   Skin:       Chronic draining ulceration from the bottom of his right foot. Drainage has increased recently with redness and swelling.  Neurological:       Does have numbness and paresthesias related to the neuropathy from his diabetes.  Endo/Heme/Allergies: Negative.   Psychiatric/Behavioral: Negative.    Blood pressure 129/78, pulse (!) 107, temperature 98 F (36.7 C), temperature source Oral, resp. rate 18, SpO2 95 %. Physical Exam  Cardiovascular:  DP and PT pulses are palpable. Capillary filling time intact to remaining digits.  Musculoskeletal:  Adequate range of motion of the pedal joints. Previous amputation of the hallux. Muscle testing deferred  Neurological:  Loss of protective threshold monofilament wire in the forefoot and toes bilateral.  Skin:  Skin is warm dry and mildly atrophic. A full-thickness ulceration on the plantar aspect of the right forefoot beneath the second metatarsal probing all the way to bone. Some erythema and edema in the right forefoot. Also a full-thickness ulceration at the amputation site of the right hallux with some underlying devitalized and necrotic tissue.    Assessment/Plan: Assessment: 1. Osteomyelitis right second metatarsal. 2. Full-thickness ulceration with abscess hallux amputation site. 3. Diabetes with associated neuropathy. Plan: Discussed with the patient the need for debridement of the bone in the right forefoot as well as the abscessed area at his amputation site. Discussed possible risks and complications including failure to heal or continued infection as well as possible   need for further surgery or amputation. Consent form for debridement of the infected bone and ulceration area on the right foot. Patient is currently nothing by mouth and will remain that way. Plan for surgery this evening after work. For now we will hold his Lovenox until after surgery.  Raymond Avery  Raymond Avery 08/18/2016, 1:01 PM

## 2016-08-18 NOTE — Progress Notes (Signed)
EKG shows atrial fibrillation and bifascicular block had similar EKG in the past heart rate is 97 Patient reports no issues with anesthesia in the past. He denies any chest pain or shortness of breath at present. His echo in the past showed EF of 55-60%.

## 2016-08-18 NOTE — Anesthesia Procedure Notes (Signed)
Procedure Name: LMA Insertion Performed by: Myrah Strawderman Pre-anesthesia Checklist: Patient identified, Patient being monitored, Timeout performed, Emergency Drugs available and Suction available Patient Re-evaluated:Patient Re-evaluated prior to inductionOxygen Delivery Method: Circle system utilized Preoxygenation: Pre-oxygenation with 100% oxygen Intubation Type: IV induction LMA: LMA inserted LMA Size: 4.5 Tube type: Oral Number of attempts: 1 Placement Confirmation: positive ETCO2 and breath sounds checked- equal and bilateral Tube secured with: Tape Dental Injury: Teeth and Oropharynx as per pre-operative assessment      

## 2016-08-18 NOTE — Interval H&P Note (Signed)
History and Physical Interval Note:  08/18/2016 4:21 PM  [name]  has presented today for surgery, with the diagnosis of N/A  The various methods of treatment have been discussed with the patient and family. After consideration of risks, benefits and other options for treatment, the patient has consented to  Procedure(s): IRRIGATION AND DEBRIDEMENT FOOT ABSCESS AND BONE (Right) as a surgical intervention .  The patient's history has been reviewed, patient examined, no change in status, stable for surgery.  I have reviewed the patient's chart and labs.  Questions were answered to the patient's satisfaction.     Ricci Barkerodd W Anaisa Radi

## 2016-08-18 NOTE — Progress Notes (Signed)
Inpatient Diabetes Program Recommendations  AACE/ADA: New Consensus Statement on Inpatient Glycemic Control (2015)  Target Ranges:  Prepandial:   less than 140 mg/dL      Peak postprandial:   less than 180 mg/dL (1-2 hours)      Critically ill patients:  140 - 180 mg/dL   Lab Results  Component Value Date   GLUCAP 300 (H) 08/18/2016   HGBA1C 7.4 (H) 06/13/2015   A1C is pending - will follow Review of Glycemic Control  Diabetes history:       DM2, Obesity, elevated BUN and creatinine  Outpatient Diabetes medications:       Per phone conversation, patient was taking Glipizide until her ran out 6-8 months ago and never got the prescription refilled.  Patient states he does not like taking medications.  Patient also states that he has a CBG monitor, but does not take CBG's.  Patient's PCP is Dr. Angus PalmsSionne George, and he plans to follow up with Dr. Greggory StallionGeorge.  Current orders for Inpatient glycemic control:       Glipizide 10 mg BID; [facility]  Inpatient Diabetes Program Recommendations:      Agree, will follow  Thank you,  Kristine LineaKaren Adon Gehlhausen, RN, BSN Diabetes Coordinator Inpatient Diabetes Program [phone] (Team Pager)

## 2016-08-19 ENCOUNTER — Encounter: Payer: Self-pay | Admitting: Podiatry

## 2016-08-19 LAB — CBC WITH DIFFERENTIAL/PLATELET
Basophils Absolute: 0 K/uL (ref 0–0.1)
Basophils Relative: 0 %
Eosinophils Absolute: 0 K/uL (ref 0–0.7)
Eosinophils Relative: 0 %
HCT: 38.5 % — ABNORMAL LOW (ref 40.0–52.0)
Hemoglobin: 13.5 g/dL (ref 13.0–18.0)
Lymphocytes Relative: 17 %
Lymphs Abs: 1.5 K/uL (ref 1.0–3.6)
MCH: 31.5 pg (ref 26.0–34.0)
MCHC: 35 g/dL (ref 32.0–36.0)
MCV: 89.9 fL (ref 80.0–100.0)
Monocytes Absolute: 0.7 K/uL (ref 0.2–1.0)
Monocytes Relative: 8 %
Neutro Abs: 6.9 K/uL — ABNORMAL HIGH (ref 1.4–6.5)
Neutrophils Relative %: 75 %
Platelets: 328 K/uL (ref 150–440)
RBC: 4.28 MIL/uL — ABNORMAL LOW (ref 4.40–5.90)
RDW: 14.4 % (ref 11.5–14.5)
WBC: 9.1 K/uL (ref 3.8–10.6)

## 2016-08-19 LAB — HEMOGLOBIN A1C
Hgb A1c MFr Bld: 10.5 % — ABNORMAL HIGH (ref 4.8–5.6)
Mean Plasma Glucose: 255 mg/dL

## 2016-08-19 LAB — GLUCOSE, CAPILLARY
GLUCOSE-CAPILLARY: 158 mg/dL — AB (ref 65–99)
Glucose-Capillary: 241 mg/dL — ABNORMAL HIGH (ref 65–99)
Glucose-Capillary: 249 mg/dL — ABNORMAL HIGH (ref 65–99)
Glucose-Capillary: 215 mg/dL — ABNORMAL HIGH (ref 65–99)

## 2016-08-19 MED ORDER — LIVING WELL WITH DIABETES BOOK
Freq: Once | Status: AC
  Administered 2016-08-19: 11:00:00
  Filled 2016-08-19: qty 1

## 2016-08-19 MED ORDER — METOPROLOL SUCCINATE ER 25 MG PO TB24
25.0000 mg | ORAL_TABLET | Freq: Every day | ORAL | Status: DC
  Administered 2016-08-19: 25 mg via ORAL
  Filled 2016-08-19: qty 1

## 2016-08-19 NOTE — Anesthesia Postprocedure Evaluation (Signed)
Anesthesia Post Note  Patient: Raymond Avery  Procedure(s) Performed: Procedure(s) (LRB): IRRIGATION AND DEBRIDEMENT FOOT ABSCESS AND BONE (Right)  Patient location during evaluation: PACU Anesthesia Type: General Level of consciousness: awake and alert Pain management: pain level controlled Vital Signs Assessment: post-procedure vital signs reviewed and stable Respiratory status: spontaneous breathing, nonlabored ventilation, respiratory function stable and patient connected to nasal cannula oxygen Cardiovascular status: blood pressure returned to baseline and stable Postop Assessment: no signs of nausea or vomiting Anesthetic complications: no    Last Vitals:  Vitals:   08/18/16 2050 08/19/16 0535  BP: 137/88 (!) 168/98  Pulse: (!) 108 83  Resp: 19   Temp: 36.8 C 36.3 C    Last Pain:  Vitals:   08/19/16 0637  TempSrc:   PainSc: 1                  Reece Fehnel S

## 2016-08-19 NOTE — Progress Notes (Signed)
Patient ID: Raymond Avery, male   DOB: 06/11/1956, 60 y.o.   MRN: [id number]  [employer] PROGRESS NOTE  Raymond Avery [id number] DOB: 08/29/1956 DOA: 08/18/2016 PCP: Raymond Avery  HPI/Subjective: Patient feels okay. Offers no complaints. Looking forward to going home soon. He states he will absolutely not take insulin for his diabetes.  Objective: Vitals:   08/19/16 0535 08/19/16 1257  BP: (!) 168/98 (!) 156/84  Pulse: 83 82  Resp:  18  Temp: 97.4 F (36.3 C) 97.6 F (36.4 C)    Filed Weights   08/18/16 1300  Weight: 133.7 kg (294 lb 12.8 oz)    ROS: Review of Systems  Constitutional: Negative for chills and fever.  Eyes: Negative for blurred vision.  Respiratory: Negative for cough and shortness of breath.   Cardiovascular: Negative for chest pain.  Gastrointestinal: Negative for abdominal pain, constipation, diarrhea, nausea and vomiting.  Genitourinary: Negative for dysuria.  Musculoskeletal: Negative for joint pain.  Neurological: Negative for dizziness and headaches.   Exam: Physical Exam  Constitutional: He is oriented to person, place, and time.  HENT:  Nose: No mucosal edema.  Mouth/Throat: No oropharyngeal exudate or posterior oropharyngeal edema.  Eyes: Conjunctivae, EOM and lids are normal. Pupils are equal, round, and reactive to light.  Neck: No JVD present. Carotid bruit is not present. No edema present. No thyroid mass and no thyromegaly present.  Cardiovascular: S1 normal and S2 normal.  Exam reveals no gallop.   No murmur heard. Pulses:      Dorsalis pedis pulses are 2+ on the right side, and 2+ on the left side.  Respiratory: No respiratory distress. He has no wheezes. He has no rhonchi. He has no rales.  GI: Soft. Bowel sounds are normal. There is no tenderness.  Musculoskeletal:       Right ankle: He exhibits swelling.       Left ankle: He exhibits swelling.  Lymphadenopathy:    He has no cervical adenopathy.  Neurological: He  is alert and oriented to person, place, and time. No cranial nerve deficit.  Skin: Skin is warm. Nails show no clubbing.  Right foot wrapped in [facility] pressure dressing  Psychiatric: He has [facility] normal mood and affect.      Data Reviewed: Basic Metabolic Panel:  Recent Labs Lab 08/18/16 1250  NA 133*  K 4.2  CL 100*  CO2 25  GLUCOSE 357*  BUN 29*  CREATININE 1.61*  CALCIUM 8.9   CBC:  Recent Labs Lab 08/18/16 1250 08/19/16 0543  WBC 9.7 9.1  NEUTROABS  --  6.9*  HGB 13.7 13.5  HCT 40.5 38.5*  MCV 90.1 89.9  PLT 321 328    CBG:  Recent Labs Lab 08/18/16 1810 08/18/16 1856 08/18/16 2110 08/19/16 0740 08/19/16 1142  GLUCAP 209* 225* 289* 241* 249*    Recent Results (from the past 240 hour(s))  Aerobic/Anaerobic Culture (surgical/deep wound)     Status: None (Preliminary result)   Collection Time: 08/18/16 11:53 AM  Result Value Ref Range Status   Specimen Description FOOT  Final   Special Requests NONE  Final   Gram Stain   Final    FEW WBC PRESENT,BOTH PMN AND MONONUCLEAR NO ORGANISMS SEEN    Culture   Final    TOO YOUNG TO READ [facility]    Report Status PENDING  Incomplete  Aerobic/Anaerobic Culture (surgical/deep wound)     Status: None (Preliminary result)   Collection Time: 08/18/16 11:53  AM  Result Value Ref Range Status   Specimen Description BONE  Final   Special Requests NONE  Final   Gram Stain   Final    FEW WBC PRESENT,BOTH PMN AND MONONUCLEAR RARE GRAM NEGATIVE RODS    Culture   Final    NO GROWTH < 24 HOURS [facility]    Report Status PENDING  Incomplete  Surgical PCR screen     Status: None   Collection Time: 08/18/16  3:14 PM  Result Value Ref Range Status   MRSA, PCR NEGATIVE NEGATIVE Final   Staphylococcus aureus NEGATIVE NEGATIVE Final    Comment:        The Xpert SA Assay (FDA approved for NASAL specimens in patients over 62 years of age), is one component of [facility] comprehensive  surveillance program.  Test performance has been validated by [facility] for patients greater than or equal to 62 year old. It is not intended to diagnose infection nor to guide or monitor treatment.       Scheduled Meds: . docusate sodium  100 mg Oral BID  . enoxaparin (LOVENOX) injection  40 mg Subcutaneous Q24H  . glipiZIDE  10 mg Oral BID AC  . insulin aspart  0-15 Units Subcutaneous TID AC & HS  . piperacillin-tazobactam (ZOSYN)  IV  3.375 g Intravenous Q8H    Assessment/Plan:  1. Osteomyelitis status post amputation. With surgery there was antibiotic bead implantation. Infectious disease consultation to decide on whether or not PICC line and IV antibiotics versus oral antibiotics would be sufficient. Patient on Zosyn for now. Bone culture showing some gram-negative rods. 2. Type 2 diabetes uncontrolled with hemoglobin A1c of 10.5. Patient absolutely refuses insulin. On glipizide 10 mg twice [facility] day 3. Atrial fibrillation and essential hypertension start Toprol-XL at night. We'll talk more about anticoagulation upon discharge. 4. Acute kidney injury versus chronic kidney disease. Recheck creatinine tomorrow.  Code Status:     Code Status Orders        Start     Ordered   08/18/16 1247  Full code  Continuous     08/18/16 1246    Code Status History    Date Active Date Inactive Code Status Order ID Comments User Context   06/10/2015  2:50 PM 06/14/2015  8:56 PM Full Code [id number]  Linus Galasodd Cline, [facility]   06/09/2015  1:06 PM 06/10/2015  2:50 PM Full Code [id number]  Katha HammingSnehalatha Konidena, [facility]     Disposition Plan: Potentially home over the weekend  Consultants:  Infectious disease  Podiatry  Procedures:  Amputation by podiatry they see operative report  Antibiotics:  Zosyn  Time spent: 25 minutes  Raymond Avery  [employer]

## 2016-08-19 NOTE — Progress Notes (Signed)
Pharmacy Antibiotic Note  Melbourne AbtsJohn Sprunger is a 60 y.o. male admitted on 08/18/2016 with wound infection.  Pharmacy has been consulted for piperacillin/tazobactam dosing.  Plan: Continue Piperacillin/tazobactam 3.375 g IV q8h EI  Height: 6\' 3"  (190.5 cm) Weight: 294 lb 12.8 oz (133.7 kg) IBW/kg (Calculated) : 84.5  Temp (24hrs), Avg:97.7 F (36.5 C), Min:97.4 F (36.3 C), Max:98.2 F (36.8 C)   Recent Labs Lab 08/18/16 1250 08/19/16 0543  WBC 9.7 9.1  CREATININE 1.61*  --     Estimated Creatinine Clearance: 70.1 mL/min (by C-G formula based on SCr of 1.61 mg/dL (H)).    No Known Allergies  Antimicrobials this admission: Piperacillin/tazobactam 11/29 >>   Dose adjustments this admission:  Microbiology results: 11/29 MRSA PCR: negative  Thank you for allowing pharmacy to be a part of this patient's care.  Delsa BernKelly m Fuhrmann, PharmD 08/19/2016 9:46 AM

## 2016-08-19 NOTE — Progress Notes (Addendum)
Inpatient Diabetes Program Recommendations  AACE/ADA: New Consensus Statement on Inpatient Glycemic Control (2015)  Target Ranges:  Prepandial:   less than 140 mg/dL      Peak postprandial:   less than 180 mg/dL (1-2 hours)      Critically ill patients:  140 - 180 mg/dL   Lab Results  Component Value Date   GLUCAP 241 (H) 08/19/2016   HGBA1C 10.5 (H) 08/18/2016    Review of Glycemic Control  Diabetes history:       DM2 Outpatient Diabetes medications:       Stopped Glipizide 6-8 months ago Current orders for Inpatient glycemic control:       Glipizide 10 mg BID, Novolog Moderate Correction (0-15 units TIDAC)  Inpatient Diabetes Program Recommendations:      Due to surgery and need for wound healing, please consider Lantus 10 units daily starting today.  Note:       Spoke with patient in room.      This RN explained why blood glucose control is important in the hospital to promote wound healing and long-term to decrease complications related to DM.      Patient states that he does not want to use insulin or Metformin as an outpatient.  Patient states he will take other oral medication(s) for DM outpatient.        MD, please consider discharging patient on two oral DM medications for best control of CBG's, such as Glipizide and possibly Invokana.  Thank you,  Kristine LineaKaren Maliya Marich, RN, BSN Diabetes Coordinator Inpatient Diabetes Program [phone] (Team Pager)

## 2016-08-19 NOTE — Plan of Care (Signed)
Problem: Food- and Nutrition-Related Knowledge Deficit (NB-1.1) Goal: Nutrition education Formal process to instruct or train a patient/client in a skill or to impart knowledge to help patients/clients voluntarily manage or modify food choices and eating behavior to maintain or improve health. Outcome: Completed/Met Date Met: 08/19/16  RD consulted for nutrition education regarding diabetes.   Lab Results  Component Value Date   HGBA1C 10.5 (H) 08/18/2016   CBGs: 225-300 past 24 hours  Spoke with patient at bedside. He reports he has never seen a Dietitian before. He does not follow any special diets at home for DM management. He reports he has not been taking his prescribed medication because he did not refill the prescription when it ran out. Patient reports he does not check CBGs at home and was not concerned about how high his CBGs have been here. When attempting to utilize teach back method and also when asked patient to choose 1-2 goals to implement now, patient was unable to provide an answer, even with prompting. Patient would benefit from outpatient nutrition counseling where steps to promote behavior change can be implemented.   Usual Intake: Breakfast: biscuit with coffee Lunch: sandwich - did not specify what is on it Dinner: "something simple" - did not provide further information upon asking Snacks: celery and carrots, peanuts, cookies Drinks: coffee, 1 quart of milk per day  RD provided "Carbohydrate Counting for People with Diabetes" handout from the Academy of Nutrition and Dietetics. Discussed different food groups and their effects on blood sugar, emphasizing carbohydrate-containing foods. Provided list of carbohydrates and recommended serving sizes of common foods.  Discussed importance of controlled and consistent carbohydrate intake throughout the day. Provided examples of ways to balance meals/snacks and encouraged intake of high-fiber, whole grain complex  carbohydrates. Teach back method used.  Expect poor compliance.  Body mass index is 36.85 kg/m. Pt meets criteria for Obesity Class II based on current BMI.  Current diet order is Carbohydrate Modified, patient is consuming approximately 100% of meals at this time. Labs and medications reviewed. No further nutrition interventions warranted at this time. RD contact information provided. If additional nutrition issues arise, please re-consult RD.  Willey Blade, MS, RD, LDN Pager: [phone] After Hours Pager: [phone]

## 2016-08-19 NOTE — Evaluation (Signed)
Physical Therapy Evaluation Patient Details Name: Raymond AbtsJohn Avery MRN: [id number] DOB: 08/28/1956 Today's Date: 08/19/2016   History of Present Illness  60 y/o male who is admittedly non-compliant with diabetes management/meds has been having issues with R foot since summer and is now s/p 2nd to excision and I&D.  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  He shows ability to walk in-home distances safely with FWW and did appear to keep weight off the forefoot well and also during stair negotiation.  He reports that he would rather use crutches but did understand why PT is recommending walker at this time.  We can try crutches next PT session to assess safety/appropriatness.  Pt eager to get home, shows little concern about being able to care for himself.    Follow Up Recommendations  (pt not interested)    Equipment Recommendations  Crutches;Rolling walker with 5" wheels (PT reccomends walker, pt indicated he'd only use crutches)    Recommendations for Other Services       Precautions / Restrictions Precautions Precautions: Fall Restrictions Weight Bearing Restrictions: Yes RLE Weight Bearing: Partial weight bearing (not formally in orders) RLE Partial Weight Bearing Percentage or Pounds: through heel only      Mobility  Bed Mobility Overal bed mobility: Independent             General bed mobility comments: Pt able to easily get himself up to sitting EOB  Transfers Overall transfer level: Independent Equipment used: Rolling walker (2 wheeled)             General transfer comment: Pt able to rise to standing with good confidence and was able to keep weight shifted back to heel - he refused the idea of using a heel wedge boot (had one for previous surgeries and hated it)  Ambulation/Gait Ambulation/Gait assistance: Min  guard Ambulation Distance (Feet): 60 Feet Assistive device: Rolling walker (2 wheeled)       General Gait Details: Pt walks with good confidence, reports no pain, and was safe (he showed good ability to use UEs to unweight with the walker, assuming he was not putting too much weight through R forefoot).  He did not have any LOBs, etc but did have significant change in vitals with HR getting to 150s with the effort.  Pt reports wanting crutches at home - RW may be too big, he reports being more likely to use crutches - PT questions the functional safety of crutches given inherent instability (and wanting to keep weight off forefoot), the increased UE and cardio effort.  Stairs Stairs: Yes Stairs assistance: Min guard Stair Management: One rail Right Number of Stairs: 3 General stair comments: PT was able to negotiate steps safely, he did struggle with keeping weight back on R heel and remaining in suitable upright posture  Wheelchair Mobility    Modified Rankin (Stroke Patients Only)       Balance Overall balance assessment: Modified Independent                                           Pertinent Vitals/Pain Pain Assessment: No/denies pain    Home Living Family/patient expects to be discharged to:: Private residence Living Arrangements: Alone Available Help at Discharge: Family Type of Home: Mobile  home Home Access: Stairs to enter Entrance Stairs-Rails: Right Entrance Stairs-Number of Steps: 4   Home Equipment: Cane - single point      Prior Function Level of Independence: Independent         Comments: Pt works, runs errands, Psychiatric nurseetc     Hand Dominance        Extremity/Trunk Assessment   Upper Extremity Assessment: Overall WFL for tasks assessed           Lower Extremity Assessment: Overall WFL for tasks assessed         Communication   Communication: No difficulties  Cognition Arousal/Alertness: Awake/alert Behavior During  Therapy: WFL for tasks assessed/performed;Impulsive Overall Cognitive Status: Within Functional Limits for tasks assessed                      General Comments      Exercises     Assessment/Plan    PT Assessment Patient needs continued PT services  PT Problem List Decreased strength;Decreased activity tolerance;Decreased balance;Decreased mobility;Decreased knowledge of use of DME;Decreased safety awareness;Cardiopulmonary status limiting activity          PT Treatment Interventions DME instruction;Gait training;Stair training;Functional mobility training;Therapeutic activities;Therapeutic exercise;Balance training;Neuromuscular re-education;Patient/family education    PT Goals (Current goals can be found in the Care Plan section)  Acute Rehab PT Goals Patient Stated Goal: go home PT Goal Formulation: With patient Time For Goal Achievement: 09/02/16 Potential to Achieve Goals: Fair    Frequency 7X/week   Barriers to discharge        Co-evaluation               End of Session Equipment Utilized During Treatment: Gait belt Activity Tolerance: Patient limited by fatigue;Patient tolerated treatment well Patient left: with chair alarm set;with call bell/phone within reach Nurse Communication: Mobility status         Time: [id number] PT Time Calculation (min) (ACUTE ONLY): 33 min   Charges:   PT Evaluation $PT Eval Low Complexity: 1 Procedure     PT G CodesMalachi Avery:        Raymond Avery, DPT 08/19/2016, 12:55 PM

## 2016-08-19 NOTE — Progress Notes (Signed)
1 Day Post-Op  Subjective: Patient seen. Doing well with no significant pain in the foot.  Objective: Vital signs in last 24 hours: Temp:  [97.4 F (36.3 C)-98.2 F (36.8 C)] 97.6 F (36.4 C) (11/30 1257) Pulse Rate:  [37-108] 82 (11/30 1257) Resp:  [9-26] 18 (11/30 1257) BP: (121-168)/(67-98) 156/84 (11/30 1257) SpO2:  [95 %-99 %] 99 % (11/30 1257) Last BM Date: 08/18/16  Intake/Output from previous day: 11/29 0701 - 11/30 0700 In: 1333 [P.O.:120; I.V.:1200; IV Piggyback:13] Out: 775 [Urine:575; Blood:200] Intake/Output this shift: Total I/O In: 480 [P.O.:480] Out: 700 [Urine:700]  Moderate to heavy bleeding is noted on the bandage from the dorsal and plantar aspect. Upon removal the the incisions are well coapted. Decreased erythema and edema in the forefoot. No sign of purulence from the incisions or the plantar open wound.  Lab Results:   Recent Labs  08/18/16 1250 08/19/16 0543  WBC 9.7 9.1  HGB 13.7 13.5  HCT 40.5 38.5*  PLT 321 328   BMET  Recent Labs  08/18/16 1250  NA 133*  K 4.2  CL 100*  CO2 25  GLUCOSE 357*  BUN 29*  CREATININE 1.61*  CALCIUM 8.9   PT/INR  Recent Labs  08/18/16 1250  LABPROT 14.7  INR 1.14   ABG No results for input(s): PHART, HCO3 in the last 72 hours.  Invalid input(s): PCO2, PO2  Studies/Results: No results found.  Anti-infectives: Anti-infectives    Start     Dose/Rate Route Frequency Ordered Stop   08/18/16 2200  piperacillin-tazobactam (ZOSYN) IVPB 3.375 g     3.375 g 12.5 mL/hr over 240 Minutes Intravenous Every 8 hours 08/18/16 1528     08/18/16 1704  vancomycin (VANCOCIN) powder  Status:  Discontinued       As needed 08/18/16 1706 08/18/16 1802   08/18/16 1703  gentamicin (GARAMYCIN) injection  Status:  Discontinued       As needed 08/18/16 1703 08/18/16 1802   08/18/16 1400  piperacillin-tazobactam (ZOSYN) IVPB 3.375 g     3.375 g 12.5 mL/hr over 240 Minutes Intravenous  Once 08/18/16 1241 08/18/16  1908      Assessment/Plan: s/p Procedure(s): IRRIGATION AND DEBRIDEMENT FOOT ABSCESS AND BONE (Right) Assessment: Good progress status post debridement soft tissue and bone right forefoot   Plan: Sterile dressing change performed today. We'll have Dr. Ether GriffinsFowler change the bandage prior to his discharge in 2 or 3 days. May be able to go home on oral antibiotics pending culture results with the antibiotic beads in his foot with vancomycin and gentamicin. Due to osteomyelitis may need IVs and PICC line. Infectious disease consult may be helpful. Plan for follow-up middle of next week for reevaluation of the wounds  LOS: 1 day    Ricci Barkerodd W Amariyah Bazar 08/19/2016

## 2016-08-20 ENCOUNTER — Encounter: Payer: Self-pay | Admitting: Podiatry

## 2016-08-20 LAB — SEDIMENTATION RATE: SED RATE: 40 mm/h — AB (ref 0–20)

## 2016-08-20 LAB — BASIC METABOLIC PANEL
Anion gap: 7 (ref 5–15)
BUN: 23 mg/dL — AB (ref 6–20)
CHLORIDE: 102 mmol/L (ref 101–111)
CO2: 27 mmol/L (ref 22–32)
CREATININE: 1.35 mg/dL — AB (ref 0.61–1.24)
Calcium: 8.9 mg/dL (ref 8.9–10.3)
GFR calc Af Amer: >60 mL/min (ref >60)
GFR calc non Af Amer: 55 mL/min — ABNORMAL LOW (ref >60)
GLUCOSE: 208 mg/dL — AB (ref 65–99)
POTASSIUM: 3.9 mmol/L (ref 3.5–5.1)
SODIUM: 136 mmol/L (ref 135–145)

## 2016-08-20 LAB — GLUCOSE, CAPILLARY
GLUCOSE-CAPILLARY: 201 mg/dL — AB (ref 65–99)
GLUCOSE-CAPILLARY: 226 mg/dL — AB (ref 65–99)
Glucose-Capillary: 188 mg/dL — ABNORMAL HIGH (ref 65–99)
Glucose-Capillary: 266 mg/dL — ABNORMAL HIGH (ref 65–99)
Glucose-Capillary: 191 mg/dL — ABNORMAL HIGH (ref 65–99)

## 2016-08-20 LAB — C-REACTIVE PROTEIN: CRP: <0.8 mg/dL (ref <1.0)

## 2016-08-20 LAB — SURGICAL PATHOLOGY

## 2016-08-20 MED ORDER — HYDRALAZINE HCL 25 MG PO TABS
25.0000 mg | ORAL_TABLET | Freq: Three times a day (TID) | ORAL | Status: DC
  Administered 2016-08-20 – 2016-08-21 (×4): 25 mg via ORAL
  Filled 2016-08-20 (×4): qty 1

## 2016-08-20 MED ORDER — AMLODIPINE BESYLATE 5 MG PO TABS
5.0000 mg | ORAL_TABLET | Freq: Every day | ORAL | Status: DC
  Administered 2016-08-20 – 2016-08-21 (×2): 5 mg via ORAL
  Filled 2016-08-20 (×2): qty 1

## 2016-08-20 MED ORDER — METOPROLOL TARTRATE 25 MG PO TABS
25.0000 mg | ORAL_TABLET | Freq: Two times a day (BID) | ORAL | Status: DC
Start: 2016-08-20 — End: 2016-08-20

## 2016-08-20 MED ORDER — RAMIPRIL 5 MG PO CAPS: 5.0000 mg | ORAL_CAPSULE | Freq: Every day | ORAL | Status: DC

## 2016-08-20 MED ORDER — METOPROLOL TARTRATE 25 MG PO TABS
25.0000 mg | ORAL_TABLET | Freq: Two times a day (BID) | ORAL | Status: DC
  Administered 2016-08-20 – 2016-08-21 (×3): 25 mg via ORAL
  Filled 2016-08-20 (×3): qty 1

## 2016-08-20 MED ORDER — SODIUM CHLORIDE 0.9 % IR SOLN
Status: DC | PRN
  Administered 2016-08-18: 2 mL

## 2016-08-20 NOTE — Progress Notes (Signed)
Physical Therapy Treatment Patient Details Name: Raymond Avery MRN: [id number] DOB: 05/10/1956 Today's Date: 08/20/2016    History of Present Illness 60 y/o male who is admittedly non-compliant with diabetes management/meds has been having issues with R foot since summer and is now s/p 2nd to excision and I&D.  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  Pt reported that last night he did accidentally put weight on the forefoot and has severe pain, he did not have any pain at all and appeared to maintain heel only WBing t/o 100 ft of slow but consistent ambulation with the crutches. Pt eager to go home, waiting on antibiotic needs from ID.  Pt safe to return home, will decrease from QD to 2-5x/wk.  Follow Up Recommendations  No PT follow up     Equipment Recommendations  Crutches (tall height)    Recommendations for Other Services       Precautions / Restrictions Precautions Precautions: Fall Restrictions RLE Weight Bearing: Partial weight bearing RLE Partial Weight Bearing Percentage or Pounds: through heel only    Mobility  Bed Mobility Overal bed mobility: Independent             General bed mobility comments: No issues with bed mobility, good confidence  Transfers Overall transfer level: Independent               General transfer comment: Pt able to rise w/ only minimal UE use, he shows good confidence, only light UE use to maintain balance once up (no AD needed)   Ambulation/Gait Ambulation/Gait assistance: Supervision Ambulation Distance (Feet): 100 Feet Assistive device: Crutches       General Gait Details: Pt able to use crutches effectively and maintain heel  only WBing on the R.  He reports good confidence and states that with the size of his home and type of driveway he has he would only consider using crutches at home.    Stairs         General stair comments: discussed appropriate use (and multiple strategies) to negotiate steps with crutches - pt did not wish to try, verbalizes good understanding  Wheelchair Mobility    Modified Rankin (Stroke Patients Only)       Balance Overall balance assessment: Independent                                  Cognition Arousal/Alertness: Awake/alert Behavior During Therapy: WFL for tasks assessed/performed;Impulsive Overall Cognitive Status: Within Functional Limits for tasks assessed                      Exercises      General Comments        Pertinent Vitals/Pain Pain Assessment: No/denies pain    Home Living                      Prior Function            PT Goals (current goals can now be found in the care plan section) Progress towards PT goals: Progressing toward goals    Frequency    Min 2X/week  PT Plan Frequency needs to be updated    Co-evaluation             End of Session Equipment Utilized During Treatment: Gait belt Activity Tolerance: Patient tolerated treatment well Patient left: in bed;with call bell/phone within reach     Time: [id number] PT Time Calculation (min) (ACUTE ONLY): 29 min  Charges:  $Gait Training: 23-37 mins                    G Codes:      Malachi ProGalen R Nowell Sites, DPT 08/20/2016, 12:24 PM

## 2016-08-20 NOTE — Consult Note (Signed)
[facility] Infectious Disease     Reason for Consult: DM foot infection    Referring Physician: Kalman Jewels Date of Admission:  08/18/2016   Active Problems:   Diabetic foot ulcer ([place])   HPI: Raymond Avery is a 60 y.o. male admitted with worsening infection of R forefoot and OM. He has poorly controlled DM, and has chronic ulceration on R foot.  On serial imaging it has progressed to OM of the 2nd MT and he has full thickness ulceration at prior great toe amputation site.  S/p surgery 11/29. On vanco and zosyn currently.  He had taken amoxicllin prior to admission - got it from some friends at work. No fevers chills.   Past Medical History:  Diagnosis Date  . A-fib ([place])   . Arrhythmia   . CHF (congestive heart failure) ([place])   . Diabetes ([place])   . Hypertension   . Hyponatremia   . Leukocytosis   . Systemic inflammatory response syndrome (HCC)   . UTI (urinary tract infection)    Past Surgical History:  Procedure Laterality Date  . AMPUTATION TOE Left 06/10/2015   Procedure: Left great toe amputation ;  Surgeon: Sharlotte Alamo, MD;  Location: [facility] ORS;  Service: Podiatry;  Laterality: Left;  . IRRIGATION AND DEBRIDEMENT FOOT Right 08/18/2016   Procedure: IRRIGATION AND DEBRIDEMENT FOOT ABSCESS AND BONE;  Surgeon: Sharlotte Alamo, DPM;  Location: [facility] ORS;  Service: Podiatry;  Laterality: Right;   Social History  Substance Use Topics  . Smoking status: Never Smoker  . Smokeless tobacco: Never Used  . Alcohol use 8.4 oz/week    14 Cans of beer per week   Family History  Problem Relation Age of Onset  . Heart attack Mother     Allergies: No Known Allergies  Current antibiotics: Antibiotics Given (last 72 hours)    Date/Time Action Medication Dose Rate   08/18/16 1508 Given   piperacillin-tazobactam (ZOSYN) IVPB 3.375 g 3.375 g 12.5 mL/hr   08/18/16 1636 Given  [Gentamicin powder (80m) added to cement beads]   gentamicin 80 mg in 0.9% sodium chloride 250 mL irrigation 2 mL     08/18/16 1703 Given   gentamicin (GARAMYCIN) injection 32,088 mg    08/18/16 1704 Given   vancomycin (VANCOCIN) powder 1,000 mg    08/18/16 2203 Given   piperacillin-tazobactam (ZOSYN) IVPB 3.375 g 3.375 g 12.5 mL/hr   08/19/16 0533 Given   piperacillin-tazobactam (ZOSYN) IVPB 3.375 g 3.375 g 12.5 mL/hr   08/19/16 1426 Given   piperacillin-tazobactam (ZOSYN) IVPB 3.375 g 3.375 g 12.5 mL/hr   08/19/16 2229 Given   piperacillin-tazobactam (ZOSYN) IVPB 3.375 g 3.375 g 12.5 mL/hr   08/20/16 0504 Given   piperacillin-tazobactam (ZOSYN) IVPB 3.375 g 3.375 g 12.5 mL/hr   08/20/16 1402 Given   piperacillin-tazobactam (ZOSYN) IVPB 3.375 g 3.375 g 12.5 mL/hr      MEDICATIONS: . amLODipine  5 mg Oral Daily  . docusate sodium  100 mg Oral BID  . enoxaparin (LOVENOX) injection  40 mg Subcutaneous Q24H  . glipiZIDE  10 mg Oral BID AC  . hydrALAZINE  25 mg Oral Q8H  . insulin aspart  0-15 Units Subcutaneous TID AC & HS  . metoprolol tartrate  25 mg Oral BID  . piperacillin-tazobactam (ZOSYN)  IV  3.375 g Intravenous Q8H    Review of Systems - 11 systems reviewed and negative per HPI   OBJECTIVE: Temp:  [97.7 F (36.5 C)-98.1 F (36.7 C)] 98.1 F (36.7 C) (  12/01 0845) Pulse Rate:  [64-79] 69 (12/01 1300) Resp:  [19] 19 (12/01 0415) BP: (140-176)/(83-115) 140/89 (12/01 1300) SpO2:  [95 %-96 %] 96 % (12/01 0847) Physical Exam  Constitutional: He is oriented to person, place, and time. Obese No distress.  HENT: anicteric Mouth/Throat: Oropharynx is clear and moist. No oropharyngeal exudate.  Cardiovascular: Normal rate, regular rhythm and normal heart sounds.  Pulmonary/Chest: Effort normal and breath sounds normal. No respiratory distress. He has no wheezes.  Abdominal: Soft. Bowel sounds are normal. He exhibits no distension. There is no tenderness.  Lymphadenopathy: He has no cervical adenopathy.  Neurological: He is alert and oriented to person, place, and time.  LE  numbness Skin: R foot with post op incision on dorsum of foot with sutures in place and no drainage or dehisence. Plantar ulcer site also with sutures- no drainage. Non tender. Foot is mildly warm Very good DP  Psychiatric: He has a normal mood and affect. His behavior is normal.     LABS: Results for orders placed or performed during the hospital encounter of 08/18/16 (from the past 48 hour(s))  Surgical PCR screen     Status: None   Collection Time: 08/18/16  3:14 PM  Result Value Ref Range   MRSA, PCR NEGATIVE NEGATIVE   Staphylococcus aureus NEGATIVE NEGATIVE    Comment:        The Xpert SA Assay (FDA approved for NASAL specimens in patients over 18 years of age), is one component of a comprehensive surveillance program.  Test performance has been validated by [facility] for patients greater than or equal to 48 year old. It is not intended to diagnose infection nor to guide or monitor treatment.   Surgical pathology     Status: None   Collection Time: 08/18/16  5:14 PM  Result Value Ref Range   SURGICAL PATHOLOGY      Surgical Pathology CASE: [id number] PATIENT: Raymond Avery Surgical Pathology Report     SPECIMEN SUBMITTED: A. 2nd toe, right 2nd metatarsal and base  CLINICAL HISTORY: None provided  PRE-OPERATIVE DIAGNOSIS: Osteomyelitis  POST-OPERATIVE DIAGNOSIS: Same as pre-op     DIAGNOSIS: A. METATARSAL AND BASE, RIGHT SECOND TOE; EXCISION: - SOFT TISSUE WITH CHRONIC ULCERATION. - OSTEOMYELITIS.   GROSS DESCRIPTION:  A. Labeled: right second metatarsal and base of second toe  Tissue fragment(s): 2  Size: 2.5 x 2.0 x 1.0 cm (marked black) and 2.8 x 2.0 x 1.6 cm (marked blue)  Description: both fragments are bony and unoriented, first fragment has one end that is a cut surface of bone and opposite surface appears articular and second fragment has one end and that is a cut surface and the opposite end  disrupted but appears  articular  Specimens are unoriented and true margin is not identified  Block summary 1-perpendicular representative first fragm ent following decalcification 2-perpendicular representative second fragment following decalcification     Final Diagnosis performed by Quay Burow, MD.  Electronically signed 08/20/2016 9:31:07AM    The electronic signature indicates that the named Attending Pathologist has evaluated the specimen  Technical component performed at [facility], [address], [place], [place] [address] Lab: [phone] Dir: Darrick Penna. Evette Doffing, MD  Professional component performed at [facility], [facility], [place], [place], [place] [address] Lab: [phone] Dir: Dellia Nims. Rubinas, MD    Glucose, capillary     Status: Abnormal   Collection Time: 08/18/16  6:10 PM  Result Value Ref Range   Glucose-Capillary 209 (H)  65 - 99 mg/dL  Glucose, capillary     Status: Abnormal   Collection Time: 08/18/16  6:56 PM  Result Value Ref Range   Glucose-Capillary 225 (H) 65 - 99 mg/dL  Glucose, capillary     Status: Abnormal   Collection Time: 08/18/16  9:10 PM  Result Value Ref Range   Glucose-Capillary 289 (H) 65 - 99 mg/dL  CBC with Differential/Platelet     Status: Abnormal   Collection Time: 08/19/16  5:43 AM  Result Value Ref Range   WBC 9.1 3.8 - 10.6 K/uL   RBC 4.28 (L) 4.40 - 5.90 MIL/uL   Hemoglobin 13.5 13.0 - 18.0 g/dL   HCT 38.5 (L) 40.0 - 52.0 %   MCV 89.9 80.0 - 100.0 fL   MCH 31.5 26.0 - 34.0 pg   MCHC 35.0 32.0 - 36.0 g/dL   RDW 14.4 11.5 - 14.5 %   Platelets 328 150 - 440 K/uL   Neutrophils Relative % 75 %   Neutro Abs 6.9 (H) 1.4 - 6.5 K/uL   Lymphocytes Relative 17 %   Lymphs Abs 1.5 1.0 - 3.6 K/uL   Monocytes Relative 8 %   Monocytes Absolute 0.7 0.2 - 1.0 K/uL   Eosinophils Relative 0 %   Eosinophils Absolute 0.0 0 - 0.7 K/uL   Basophils Relative 0 %   Basophils Absolute 0.0 0 - 0.1 K/uL  Glucose, capillary     Status:  Abnormal   Collection Time: 08/19/16  7:40 AM  Result Value Ref Range   Glucose-Capillary 241 (H) 65 - 99 mg/dL  Glucose, capillary     Status: Abnormal   Collection Time: 08/19/16 11:42 AM  Result Value Ref Range   Glucose-Capillary 249 (H) 65 - 99 mg/dL  Glucose, capillary     Status: Abnormal   Collection Time: 08/19/16  4:27 PM  Result Value Ref Range   Glucose-Capillary 158 (H) 65 - 99 mg/dL  Glucose, capillary     Status: Abnormal   Collection Time: 08/19/16  9:13 PM  Result Value Ref Range   Glucose-Capillary 215 (H) 65 - 99 mg/dL  Glucose, capillary     Status: Abnormal   Collection Time: 08/20/16  4:17 AM  Result Value Ref Range   Glucose-Capillary 201 (H) 65 - 99 mg/dL  Basic metabolic panel     Status: Abnormal   Collection Time: 08/20/16  5:21 AM  Result Value Ref Range   Sodium 136 135 - 145 mmol/L   Potassium 3.9 3.5 - 5.1 mmol/L   Chloride 102 101 - 111 mmol/L   CO2 27 22 - 32 mmol/L   Glucose, Bld 208 (H) 65 - 99 mg/dL   BUN 23 (H) 6 - 20 mg/dL   Creatinine, Ser 1.35 (H) 0.61 - 1.24 mg/dL   Calcium 8.9 8.9 - 10.3 mg/dL   GFR calc non Af Amer 55 (L) >60 mL/min   GFR calc Af Amer >60 >60 mL/min    Comment: (NOTE) The eGFR has been calculated using the CKD EPI equation. This calculation has not been validated in all clinical situations. eGFR's persistently <60 mL/min signify possible Chronic Kidney Disease.    Anion gap 7 5 - 15  Glucose, capillary     Status: Abnormal   Collection Time: 08/20/16  7:35 AM  Result Value Ref Range   Glucose-Capillary 188 (H) 65 - 99 mg/dL  Glucose, capillary     Status: Abnormal   Collection Time: 08/20/16 11:48 AM  Result Value  Ref Range   Glucose-Capillary 266 (H) 65 - 99 mg/dL   Comment 1 Notify RN    No components found for: ESR, C REACTIVE PROTEIN MICRO: Recent Results (from the past 720 hour(s))  Aerobic/Anaerobic Culture (surgical/deep wound)     Status: None (Preliminary result)   Collection Time: 08/18/16  11:53 AM  Result Value Ref Range Status   Specimen Description FOOT  Final   Special Requests NONE  Final   Gram Stain   Final    FEW WBC PRESENT,BOTH PMN AND MONONUCLEAR NO ORGANISMS SEEN Performed at [facility]    Culture RARE [place]  Final   Report Status PENDING  Incomplete  Aerobic/Anaerobic Culture (surgical/deep wound)     Status: None (Preliminary result)   Collection Time: 08/18/16 11:53 AM  Result Value Ref Range Status   Specimen Description BONE  Final   Special Requests NONE  Final   Gram Stain   Final    FEW WBC PRESENT,BOTH PMN AND MONONUCLEAR RARE GRAM NEGATIVE RODS    Culture   Final    NO GROWTH 2 DAYS Performed at [facility]    Report Status PENDING  Incomplete  Surgical PCR screen     Status: None   Collection Time: 08/18/16  3:14 PM  Result Value Ref Range Status   MRSA, PCR NEGATIVE NEGATIVE Final   Staphylococcus aureus NEGATIVE NEGATIVE Final    Comment:        The Xpert SA Assay (FDA approved for NASAL specimens in patients over 49 years of age), is one component of a comprehensive surveillance program.  Test performance has been validated by [facility] for patients greater than or equal to 36 year old. It is not intended to diagnose infection nor to guide or monitor treatment.    10/17 Result 1 - [facility] Comment  Comment: No anaerobic growth in 72 hours.   Aerobic Culture - [facility] Final report (A)   Result 1 - [facility] Yeast isolated. (A)  Comment: Light growth Request for further identification must be made within 1 week.     IMAGING: 08/18/16 FINDINGS: 3 views of the right foot reveal some signs of lytic destructive changes  at the head of the second metatarsal as compared to previous films  consistent with osteomyelitis.No evidence of any gas in the tissues. 07/06/2016  FINDINGS: 3 views of the right foot reveal no interval change in the alignment or  displacement of the second metatarsal  fracture or dislocated third toe. No clear evidence of any cortical erosions or signs of osteomyelitis.  Assessment:   Raymond Avery is a 60 y.o. male with poorly controlled DM (A1c 10.5) and progressive infection of R foot plantar ulcer site, with evidence of OM on Xray. Now s/p on 08/18/16 surgery with - 1. Debridement of bone and joint right second metatarsophalangeal joint. 2. Incision with excisional debridement abscess right hallux amputation site.  Per op note good bleeding was noted and devitalized tissue was removed. Abx beads were placed. Gram stain from both tissue and bone show GNR but cultures pending - he was on amox prior to admission though.  otpt cx 10/17 just grew yeast.  Path does show OM.  We discussed that I would recommend IV abx course for several weeks given OM and PN and poorly controlled DM.  He adamantly refuses. It is good that he seems to have good circulation and does not smoke.   Recommendations Based on his being on amox  prior to admission, if the cultures are negative can dc on augmentin 875 bid for a 14 day course If cxs grow organism resistant to augmentin (ie pseudomonas) would need therapy directed based on the sensitivities of the organism.  I will follow up on his cultures and call him to adjust if he is dced prior to them being final He can follow with podiatry and no need to see me unless he worsens.   Thank you very much for allowing me to participate in the care of this patient. Please call with questions.   Cheral Marker. Ola Spurr, MD

## 2016-08-20 NOTE — Progress Notes (Signed)
Pharmacy Antibiotic Note  Raymond AbtsJohn Avery is a 60 y.o. male admitted on 08/18/2016 with wound infection.  Pharmacy has been consulted for piperacillin/tazobactam dosing. Podiatry on board.   Plan: Continue Piperacillin/tazobactam 3.375 g IV q8h EI  Height: 6\' 3"  (190.5 cm) Weight: 294 lb 12.8 oz (133.7 kg) IBW/kg (Calculated) : 84.5  Temp (24hrs), Avg:97.9 F (36.6 C), Min:97.6 F (36.4 C), Max:98.1 F (36.7 C)   Recent Labs Lab 08/18/16 1250 08/19/16 0543 08/20/16 0521  WBC 9.7 9.1  --   CREATININE 1.61*  --  1.35*    Estimated Creatinine Clearance: 83.6 mL/min (by C-G formula based on SCr of 1.35 mg/dL (H)).    No Known Allergies  Antimicrobials this admission: Piperacillin/tazobactam 11/29 >>   Dose adjustments this admission:  Microbiology results: 11/29 MRSA PCR: negative  Thank you for allowing pharmacy to be a part of this patient's care.  Demetrius CharityJames,Raymond Avery, PharmD 08/20/2016 11:01 AM

## 2016-08-20 NOTE — Care Management (Signed)
RNCM following for potential need for home IV antibiotics.  Cultures pending.  PT has evaluated patient and does not recommend and services at discharge.  Tall crutches have been ordered and delivered to room.  RNCM following for discharge needs.

## 2016-08-20 NOTE — Progress Notes (Signed)
Daily Progress Note   Subjective  - 2 Days Post-Op  F/u right foot surgery.  No complaints  Objective Vitals:   08/20/16 0847 08/20/16 0851 08/20/16 1224 08/20/16 1300  BP: (!) 174/108 (!) 172/106 (!) 154/86 140/89  Pulse: 68   69  Resp:      Temp:      TempSrc:      SpO2: 96%     Weight:      Height:        Physical Exam: Incisions well coapted.  No dehiscence.  No erythema.  No drainage.  Laboratory CBC    Component Value Date/Time   WBC 9.1 08/19/2016 0543   HGB 13.5 08/19/2016 0543   HGB 17.1 09/25/2013 1151   HCT 38.5 (L) 08/19/2016 0543   HCT 49.8 09/25/2013 1151   PLT 328 08/19/2016 0543   PLT 251 09/25/2013 1151    BMET    Component Value Date/Time   NA 136 08/20/2016 0521   NA 133 (L) 09/25/2013 1151   K 3.9 08/20/2016 0521   K 4.2 09/25/2013 1151   CL 102 08/20/2016 0521   CL 101 09/25/2013 1151   CO2 27 08/20/2016 0521   CO2 28 09/25/2013 1151   GLUCOSE 208 (H) 08/20/2016 0521   GLUCOSE 158 (H) 09/25/2013 1151   BUN 23 (H) 08/20/2016 0521   BUN 13 09/25/2013 1151   CREATININE 1.35 (H) 08/20/2016 0521   CREATININE 1.07 09/25/2013 1151   CALCIUM 8.9 08/20/2016 0521   CALCIUM 9.4 09/25/2013 1151   GFRNONAA 55 (L) 08/20/2016 0521   GFRNONAA >60 09/25/2013 1151   GFRAA >60 08/20/2016 0521   GFRAA >60 09/25/2013 1151   Results for orders placed or performed during the hospital encounter of 08/18/16  Aerobic/Anaerobic Culture (surgical/deep wound)     Status: None (Preliminary result)   Collection Time: 08/18/16 11:53 AM  Result Value Ref Range Status   Specimen Description FOOT  Final   Special Requests NONE  Final   Gram Stain   Final    FEW WBC PRESENT,BOTH PMN AND MONONUCLEAR NO ORGANISMS SEEN    Culture   Final    TOO YOUNG TO READ [facility]    Report Status PENDING  Incomplete  Aerobic/Anaerobic Culture (surgical/deep wound)     Status: None (Preliminary result)   Collection Time: 08/18/16 11:53 AM  Result  Value Ref Range Status   Specimen Description BONE  Final   Special Requests NONE  Final   Gram Stain   Final    FEW WBC PRESENT,BOTH PMN AND MONONUCLEAR RARE GRAM NEGATIVE RODS    Culture   Final    NO GROWTH 2 DAYS Performed at [facility]    Report Status PENDING  Incomplete  Surgical PCR screen     Status: None   Collection Time: 08/18/16  3:14 PM  Result Value Ref Range Status   MRSA, PCR NEGATIVE NEGATIVE Final   Staphylococcus aureus NEGATIVE NEGATIVE Final    Comment:        The Xpert SA Assay (FDA approved for NASAL specimens in patients over 62 years of age), is one component of a comprehensive surveillance program.  Test performance has been validated by [facility] for patients greater than or equal to 62 year old. It is not intended to diagnose infection nor to guide or monitor treatment.     Assessment/Planning: Incisions healing nicely.  No dehiscence.   From podiatry standpoint stable for d/c.  Awaiting opinion of ID for antibiotic therapy.  Pt to f/u in 1 week in outpt clinic.  Dressing changed today.  No need for further dressing changes unless breakthrough bleeding or becomes soiled.    Gwyneth RevelsFowler, Knolan Simien A  08/20/2016, 1:54 PM

## 2016-08-20 NOTE — Progress Notes (Signed)
Patient ID: Raymond Avery, male   DOB: 08/12/1956, 60 y.o.   MRN: [id number]  [facility] PROGRESS NOTE  Raymond Raymond Avery:[id number] DOB: 08/06/1956 DOA: 08/18/2016 PCP: Raymond Avery, Raymond Avery  HPI/Subjective: Patient feels okay. Offers no complaints. Patient did have some pain in his foot. He does not want to take blood thinner for his Avery. fib. He does not want to take an ACE inhibitor for his blood pressure he does not want to take insulin for his diabetes. He states that his son cannot pick him up tonight.  Objective: Vitals:   08/20/16 1224 08/20/16 1300  BP: (!) 154/86 140/89  Pulse:  69  Resp:    Temp:      Filed Weights   08/18/16 1300  Weight: 133.7 kg (294 lb 12.8 oz)    ROS: Review of Systems  Constitutional: Negative for chills and fever.  Eyes: Negative for blurred vision.  Respiratory: Negative for cough and shortness of breath.   Cardiovascular: Negative for chest pain.  Gastrointestinal: Negative for abdominal pain, constipation, diarrhea, nausea and vomiting.  Genitourinary: Negative for dysuria.  Musculoskeletal: Positive for joint pain.  Neurological: Negative for dizziness and headaches.   Exam: Physical Exam  Constitutional: He is oriented to person, place, and time.  HENT:  Nose: No mucosal edema.  Mouth/Throat: No oropharyngeal exudate or posterior oropharyngeal edema.  Eyes: Conjunctivae, EOM and lids are normal. Pupils are equal, round, and reactive to light.  Neck: No JVD present. Carotid bruit is not present. No edema present. No thyroid mass and no thyromegaly present.  Cardiovascular: S1 normal and S2 normal.  Exam reveals no gallop.   No murmur heard. Pulses:      Dorsalis pedis pulses are 2+ on the right side, and 2+ on the left side.  Respiratory: No respiratory distress. He has no wheezes. He has no rhonchi. He has no rales.  GI: Soft. Bowel sounds are normal. There is no tenderness.  Musculoskeletal:       Right ankle: He exhibits  swelling.       Left ankle: He exhibits swelling.  Lymphadenopathy:    He has no cervical adenopathy.  Neurological: He is alert and oriented to person, place, and time. No cranial nerve deficit.  Skin: Skin is warm. Nails show no clubbing.  Right foot wrapped in Avery pressure dressing  Psychiatric: He has Avery normal mood and affect.      Data Reviewed: Basic Metabolic Panel:  Recent Labs Lab 08/18/16 1250 08/20/16 0521  NA 133* 136  K 4.2 3.9  CL 100* 102  CO2 25 27  GLUCOSE 357* 208*  BUN 29* 23*  CREATININE 1.61* 1.35*  CALCIUM 8.9 8.9   CBC:  Recent Labs Lab 08/18/16 1250 08/19/16 0543  WBC 9.7 9.1  NEUTROABS  --  6.9*  HGB 13.7 13.5  HCT 40.5 38.5*  MCV 90.1 89.9  PLT 321 328    CBG:  Recent Labs Lab 08/19/16 1627 08/19/16 2113 08/20/16 0417 08/20/16 0735 08/20/16 1148  GLUCAP 158* 215* 201* 188* 266*    Recent Results (from the past 240 hour(s))  Aerobic/Anaerobic Culture (surgical/deep wound)     Status: None (Preliminary result)   Collection Time: 08/18/16 11:53 AM  Result Value Ref Range Status   Specimen Description FOOT  Final   Special Requests NONE  Final   Gram Stain   Final    FEW WBC PRESENT,BOTH PMN AND MONONUCLEAR NO ORGANISMS SEEN [facility]formed [facility]  Culture RARE PSEUDOMONAS AERUGINOSA  Final   Report Status PENDING  Incomplete  Aerobic/Anaerobic Culture (surgical/deep wound)     Status: None (Preliminary result)   Collection Time: 08/18/16 11:53 AM  Result Value Ref Range Status   Specimen Description BONE  Final   Special Requests NONE  Final   Gram Stain   Final    FEW WBC PRESENT,BOTH PMN AND MONONUCLEAR RARE GRAM NEGATIVE RODS    Culture   Final    NO GROWTH 2 DAYS Performed at [facility]    Report Status PENDING  Incomplete  Surgical PCR screen     Status: None   Collection Time: 08/18/16  3:14 PM  Result Value Ref Range Status   MRSA, PCR NEGATIVE NEGATIVE Final   Staphylococcus aureus  NEGATIVE NEGATIVE Final    Comment:        The Xpert SA Assay (FDA approved for NASAL specimens in patients over 62 years of age), is one component of Avery comprehensive surveillance program.  Test performance has been validated by [facility] for patients greater than or equal to 62 year old. It is not intended to diagnose infection nor to guide or monitor treatment.       Scheduled Meds: . amLODipine  5 mg Oral Daily  . docusate sodium  100 mg Oral BID  . enoxaparin (LOVENOX) injection  40 mg Subcutaneous Q24H  . glipiZIDE  10 mg Oral BID AC  . hydrALAZINE  25 mg Oral Q8H  . insulin aspart  0-15 Units Subcutaneous TID AC & HS  . metoprolol tartrate  25 mg Oral BID  . piperacillin-tazobactam (ZOSYN)  IV  3.375 g Intravenous Q8H    Assessment/Plan:  1. Osteomyelitis status post amputation. With surgery there was antibiotic bead implantation. Infectious disease consultation Stated that we can do oral antibiotics. Currently on Zosyn. Since it looks like Pseudomonas may be growing out of one of the cultures, can discharge on 2 weeks' worth of Cipro. 2. Type 2 diabetes uncontrolled with hemoglobin A1c of 10.5. Patient absolutely refuses insulin. On glipizide 10 mg twice Avery day. 3. Atrial fibrillation and essential hypertension. Patient absolutely refuses anticoagulation. Restart his blood pressure medications 4. Acute kidney injury. Creatinine improved from prior.   Code Status:     Code Status Orders        Start     Ordered   08/18/16 1247  Full code  Continuous     08/18/16 1246    Code Status History    Date Active Date Inactive Code Status Order ID Comments User Context   06/10/2015  2:50 PM 06/14/2015  8:56 PM Full Code [id number]  Linus Galasodd Cline, Avery Inpatient   06/09/2015  1:06 PM 06/10/2015  2:50 PM Full Code [id number]  Katha HammingSnehalatha Konidena, Avery Inpatient     Disposition Plan: Discharge Saturday morning  Consultants:  Infectious  disease  Podiatry  Procedures:  Amputation by podiatry they see operative report  Antibiotics:  Zosyn  Time spent: 25 minutes  Raymond Avery, Raymond Avery  Raymond Avery [employer]

## 2016-08-21 LAB — GLUCOSE, CAPILLARY
GLUCOSE-CAPILLARY: 195 mg/dL — AB (ref 65–99)
Glucose-Capillary: 164 mg/dL — ABNORMAL HIGH (ref 65–99)
Glucose-Capillary: 283 mg/dL — ABNORMAL HIGH (ref 65–99)

## 2016-08-21 MED ORDER — CIPROFLOXACIN HCL 500 MG PO TABS: 500.0000 mg | ORAL_TABLET | Freq: Two times a day (BID) | ORAL | 0 refills | Status: AC

## 2016-08-21 NOTE — Care Management Note (Signed)
Case Management Note  Patient Details  Name: Raymond Avery MRN: [id number] Date of Birth: 12/13/1955  Subjective/Objective:     Discharge home today with crutches which were delivered to Raymond Avery hospital room yesterday. No home health services or IV ABX ordered.              Action/Plan:   Expected Discharge Date:                  Expected Discharge Plan:     In-House Referral:     Discharge planning Services     Post Acute Care Choice:    Choice offered to:     DME Arranged:    DME Agency:     HH Arranged:    HH Agency:     Status of Service:     If discussed at [employer] Length of Stay Meetings, dates discussed:    Additional Comments:  Ulysse Siemen A, RN 08/21/2016, 12:27 PM

## 2016-08-21 NOTE — Discharge Summary (Signed)
[facility] - [place] [facility]  Raymond Avery, 60 y.o., DOB 12/15/1955, MRN [id number]. Admission date: 08/18/2016 Discharge Date 08/21/2016 Primary MD Rayetta HumphreyGeorge, Sionne A, MD Admitting Physician Enedina FinnerSona Teigen Parslow, MD  Admission Diagnosis  osteomyelitis rt foot N/A  Discharge Diagnosis   Active Problems: Diabetic foot ulcer (HCC) with osteomyelitis Osteomyelitis of the right second metatarsal Abscess of the right forefoot DM 2 Atrial fibrillation Essential hypertension Acute kidney injury Med noncompliance          Hospital Course Raymond Avery  is a 60 y.o. male with a known history of Uncontrolled diabetes with medical noncompliance, hypertension, history of Chf comes from Dr. Dory Larsenline's office with worsening diabetic foot infection. Patient was seen by podiatry And underwent1. Debridement of bone and joint right second metatarsophalangeal joint. 2. Incision with excisional debridement abscess right hallux amputation site. Patient's culture growing Pseudomonas. He was seen by infectious disease who recommended ciprofloxacin for 2 weeks. He will follow up outpatient with podiatry in 1 week. He is doing much better and is stable for discharge. Patient refused to have medications adjusted while in the hospital for his blood pressure and diabetes.           Consults   podiatry  Significant Tests:  See full reports for all details     No results found.     Today   Subjective:   Raymond Avery  patient doing well denies any complaints was to go home  Objective:   Blood pressure (!) 156/82, pulse 71, temperature 97.6 F (36.4 C), temperature source Oral, resp. rate (!) 22, height 6\' 3"  (1.905 m), weight 299 lb 12.8 oz (136 kg), SpO2 96 %.  .  Intake/Output Summary (Last 24 hours) at 08/21/16 1429 Last data filed at 08/21/16 0900  Gross per 24 hour  Intake              270 ml  Output                0 ml  Net              270 ml    Exam VITAL SIGNS:  Blood pressure (!) 156/82, pulse 71, temperature 97.6 F (36.4 C), temperature source Oral, resp. rate (!) 22, height 6\' 3"  (1.905 m), weight 299 lb 12.8 oz (136 kg), SpO2 96 %.  GENERAL:  [age] y.o.-year-old patient lying in the bed with no acute distress.  EYES: Pupils equal, round, reactive to light and accommodation. No scleral icterus. Extraocular muscles intact.  HEENT: Head atraumatic, normocephalic. Oropharynx and nasopharynx clear.  NECK:  Supple, no jugular venous distention. No thyroid enlargement, no tenderness.  LUNGS: Normal breath sounds bilaterally, no wheezing, rales,rhonchi or crepitation. No use of accessory muscles of respiration.  CARDIOVASCULAR: S1, S2 normal. No murmurs, rubs, or gallops.  ABDOMEN: Soft, nontender, nondistended. Bowel sounds present. No organomegaly or mass.  EXTREMITIES: No pedal edema, cyanosis, or clubbing.  NEUROLOGIC: Cranial nerves II through XII are intact. Muscle strength 5/5 in all extremities. Sensation intact. Gait not checked.  PSYCHIATRIC: The patient is alert and oriented x 3.  SKIN: Right foot dressing change   Data Review     CBC w Diff: Lab Results  Component Value Date   WBC 9.1 08/19/2016   HGB 13.5 08/19/2016   HGB 17.1 09/25/2013   HCT 38.5 (L) 08/19/2016   HCT 49.8 09/25/2013   PLT 328 08/19/2016   PLT 251 09/25/2013   LYMPHOPCT 17 08/19/2016  LYMPHOPCT 21.6 05/18/2013   MONOPCT 8 08/19/2016   MONOPCT 13.5 05/18/2013   EOSPCT 0 08/19/2016   EOSPCT 5.5 05/18/2013   BASOPCT 0 08/19/2016   BASOPCT 0.7 05/18/2013   CMP: Lab Results  Component Value Date   NA 136 08/20/2016   NA 133 (L) 09/25/2013   K 3.9 08/20/2016   K 4.2 09/25/2013   CL 102 08/20/2016   CL 101 09/25/2013   CO2 27 08/20/2016   CO2 28 09/25/2013   BUN 23 (H) 08/20/2016   BUN 13 09/25/2013   CREATININE 1.35 (H) 08/20/2016   CREATININE 1.07 09/25/2013   PROT 7.9 06/09/2015   PROT 8.0 09/25/2013   ALBUMIN 3.4 (L) 06/09/2015   ALBUMIN 3.5  09/25/2013   BILITOT 0.8 06/09/2015   BILITOT 0.8 09/25/2013   ALKPHOS 66 06/09/2015   ALKPHOS 89 09/25/2013   AST 23 06/09/2015   AST 22 09/25/2013   ALT 27 06/09/2015   ALT 20 09/25/2013  .  Micro Results Recent Results (from the past 240 hour(s))  Aerobic/Anaerobic Culture (surgical/deep wound)     Status: None (Preliminary result)   Collection Time: 08/18/16 11:53 AM  Result Value Ref Range Status   Specimen Description FOOT  Final   Special Requests NONE  Final   Gram Stain   Final    FEW WBC PRESENT,BOTH PMN AND MONONUCLEAR NO ORGANISMS SEEN Performed at [facility]    Culture   Final    RARE PSEUDOMONAS AERUGINOSA NO ANAEROBES ISOLATED; CULTURE IN PROGRESS FOR 5 DAYS    Report Status PENDING  Incomplete   Organism ID, Bacteria PSEUDOMONAS AERUGINOSA  Final      Susceptibility   Pseudomonas aeruginosa - MIC*    CEFTAZIDIME 4 SENSITIVE Sensitive     CIPROFLOXACIN <=0.25 SENSITIVE Sensitive     GENTAMICIN <=1 SENSITIVE Sensitive     IMIPENEM 2 SENSITIVE Sensitive     PIP/TAZO 8 SENSITIVE Sensitive     CEFEPIME 2 SENSITIVE Sensitive     * RARE PSEUDOMONAS AERUGINOSA  Aerobic/Anaerobic Culture (surgical/deep wound)     Status: None (Preliminary result)   Collection Time: 08/18/16 11:53 AM  Result Value Ref Range Status   Specimen Description BONE  Final   Special Requests NONE  Final   Gram Stain   Final    FEW WBC PRESENT,BOTH PMN AND MONONUCLEAR RARE GRAM NEGATIVE RODS    Culture   Final    NO GROWTH 2 DAYS Performed at [facility]    Report Status PENDING  Incomplete  Surgical PCR screen     Status: None   Collection Time: 08/18/16  3:14 PM  Result Value Ref Range Status   MRSA, PCR NEGATIVE NEGATIVE Final   Staphylococcus aureus NEGATIVE NEGATIVE Final    Comment:        The Xpert SA Assay (FDA approved for NASAL specimens in patients over 62 years of age), is one component of a comprehensive surveillance program.  Test performance  has been validated by [facility] for patients greater than or equal to 357 year old. It is not intended to diagnose infection nor to guide or monitor treatment.         Code Status Orders        Start     Ordered   08/18/16 1247  Full code  Continuous     08/18/16 1246    Code Status History    Date Active Date Inactive Code Status Order ID Comments User  Context   06/10/2015  2:50 PM 06/14/2015  8:56 PM Full Code [id number]  Linus Galas, MD Inpatient   06/09/2015  1:06 PM 06/10/2015  2:50 PM Full Code [id number]  Katha Hamming, MD Inpatient          Follow-up Information    Ricci Barker, DPM Follow up in 1 week(s).   Specialty:  Podiatry Contact information: [address] [facility] [place] [address] [phone]        Rayetta Humphrey, MD Follow up in 7 day(s).   Specialty:  Family Medicine Contact information: [address] [place] [address] [phone]           Discharge Medications     Medication List    TAKE these medications   amLODipine 10 MG tablet Commonly known as:  NORVASC Take 10 mg by mouth daily.   ciprofloxacin 500 MG tablet Commonly known as:  CIPRO Take 1 tablet (500 mg total) by mouth 2 (two) times daily.   hydrALAZINE 100 MG tablet Commonly known as:  APRESOLINE Take 100 mg by mouth daily.   hydrochlorothiazide 25 MG tablet Commonly known as:  HYDRODIURIL TAKE 2 TABLETS BY MOUTH EVERY DAY   metoprolol tartrate 25 MG tablet Commonly known as:  LOPRESSOR Take 1 tablet (25 mg total) by mouth 2 (two) times daily.            Durable Medical Equipment        Start     Ordered   08/20/16 1158  For home use only DME Crutches  Once    Comments:  Tall crutches   08/20/16 1158         Total Time in preparing paper work, data evaluation and todays exam - 35 minutes  Auburn Bilberry M.D on 08/21/2016 at 2:29 PM  [facility]   Office  [phone]

## 2016-08-21 NOTE — Discharge Instructions (Signed)
[facility] - [place] at [facility]  DIET:  Cardiac diet, diabetic diet  DISCHARGE CONDITION:  Stable  ACTIVITY:  Activity as tolerated, walker, and crutches  OXYGEN:  Home Oxygen: No.   Oxygen Delivery: room air  DISCHARGE LOCATION:  home    ADDITIONAL DISCHARGE INSTRUCTION:   If you experience worsening of your admission symptoms, develop shortness of breath, life threatening emergency, suicidal or homicidal thoughts you must seek medical attention immediately by calling 911 or calling your MD immediately  if symptoms less severe.  You Must read complete instructions/literature along with all the possible adverse reactions/side effects for all the Medicines you take and that have been prescribed to you. Take any new Medicines after you have completely understood and accpet all the possible adverse reactions/side effects.   Please note  You were cared for by a hospitalist during your hospital stay. If you have any questions about your discharge medications or the care you received while you were in the hospital after you are discharged, you can call the unit and asked to speak with the hospitalist on call if the hospitalist that took care of you is not available. Once you are discharged, your primary care physician will handle any further medical issues. Please note that NO REFILLS for any discharge medications will be authorized once you are discharged, as it is imperative that you return to your primary care physician (or establish a relationship with a primary care physician if you do not have one) for your aftercare needs so that they can reassess your need for medications and monitor your lab values.

## 2016-08-23 ENCOUNTER — Telehealth: Payer: Self-pay | Admitting: Infectious Diseases

## 2016-08-23 LAB — AEROBIC/ANAEROBIC CULTURE W GRAM STAIN (SURGICAL/DEEP WOUND)

## 2016-08-23 LAB — AEROBIC/ANAEROBIC CULTURE (SURGICAL/DEEP WOUND)

## 2016-08-24 NOTE — Telephone Encounter (Signed)
error 

## 2016-09-29 DIAGNOSIS — Z91199 Patient's noncompliance with other medical treatment and regimen due to unspecified reason: Secondary | ICD-10-CM | POA: Insufficient documentation

## 2016-09-29 DIAGNOSIS — Z9119 Patient's noncompliance with other medical treatment and regimen: Secondary | ICD-10-CM | POA: Insufficient documentation

## 2017-04-04 ENCOUNTER — Other Ambulatory Visit: Payer: Self-pay | Admitting: *Deleted

## 2017-04-04 ENCOUNTER — Ambulatory Visit
Admission: RE | Admit: 2017-04-04 | Discharge: 2017-04-04 | Disposition: A | Discharge status: Home / Self Care | Payer: Disability Insurance | Source: Ambulatory Visit | Attending: *Deleted | Admitting: *Deleted

## 2017-04-04 ENCOUNTER — Ambulatory Visit
Admission: RE | Admit: 2017-04-04 | Discharge: 2017-04-04 | Disposition: A | Discharge status: Home / Self Care | Payer: Disability Insurance | Source: Ambulatory Visit | Attending: Radiology | Admitting: Radiology

## 2017-04-04 DIAGNOSIS — M25562 Pain in left knee: Secondary | ICD-10-CM

## 2017-04-04 DIAGNOSIS — M1712 Unilateral primary osteoarthritis, left knee: Secondary | ICD-10-CM | POA: Insufficient documentation

## 2017-09-28 ENCOUNTER — Ambulatory Visit: Payer: Self-pay

## 2017-09-29 ENCOUNTER — Ambulatory Visit: Payer: Self-pay | Admitting: Family Medicine

## 2017-09-29 VITALS — BP 218/104 | HR 71 | Wt 291.4 lb

## 2017-09-29 DIAGNOSIS — Z09 Encounter for follow-up examination after completed treatment for conditions other than malignant neoplasm: Secondary | ICD-10-CM

## 2017-09-29 DIAGNOSIS — Z794 Long term (current) use of insulin: Secondary | ICD-10-CM

## 2017-09-29 DIAGNOSIS — I10 Essential (primary) hypertension: Principal | ICD-10-CM

## 2017-09-29 DIAGNOSIS — E1159 Type 2 diabetes mellitus with other circulatory complications: Secondary | ICD-10-CM

## 2017-09-29 DIAGNOSIS — I152 Hypertension secondary to endocrine disorders: Secondary | ICD-10-CM

## 2017-09-29 DIAGNOSIS — S6992XA Unspecified injury of left wrist, hand and finger(s), initial encounter: Secondary | ICD-10-CM

## 2017-09-29 DIAGNOSIS — E118 Type 2 diabetes mellitus with unspecified complications: Secondary | ICD-10-CM

## 2017-09-29 MED ORDER — GLIPIZIDE 10 MG PO TABS: 10.0000 mg | ORAL_TABLET | Freq: Every day | ORAL | 0 refills | Status: DC

## 2017-09-29 NOTE — Progress Notes (Signed)
Patient: Raymond Avery Male    DOB: 1956-08-19   62 y.o.   MRN: [id number] Visit Date: 09/29/2017  Today's Provider: Azzie Glatter, FNP   Chief Complaint  Patient presents with  . Wrist Injury    potential broken wrist   Subjective:   HPI  Patient states that he is here today for follow up of broken left wrist. He states that his daughter made him come to appointment. He states that he only takes Hydralzaine, HCTZ, and Metoprolol. He states that he does not take these medications on a daily basis, he takes each of them occasionally. Patient states that he is aware of elevated BP, and that his BP's have always been high. He states that he does not have any symptoms related to elevated BPs.   Denies chest pain, shortness of breath, heart palpitations, cough. Denes dizziness, headaches, changes in vision, falls. When asked daughter if she could tell when his BP is elevated she stated that she could tell because his face gets 'very red.'  When offered Rx for pain medication, patient refused.  He states that he would like to have refill on his Glipizide.   No Known Allergies Previous Medications   AMLODIPINE (NORVASC) 10 MG TABLET    Take 10 mg by mouth daily.   HYDRALAZINE (APRESOLINE) 100 MG TABLET    Take 100 mg by mouth daily.   HYDROCHLOROTHIAZIDE (HYDRODIURIL) 25 MG TABLET    TAKE 2 TABLETS BY MOUTH EVERY DAY   METOPROLOL TARTRATE (LOPRESSOR) 25 MG TABLET    Take 1 tablet (25 mg total) by mouth 2 (two) times daily.    Review of Systems  Constitutional: Positive for diaphoresis.  HENT: Negative.   Eyes: Negative.   Respiratory: Negative.   Cardiovascular: Negative.   Gastrointestinal: Positive for abdominal distention.  Endocrine: Negative.   Genitourinary: Negative.   Musculoskeletal: Positive for gait problem and joint swelling.  Skin:       Discoloration of lower extremities  Allergic/Immunologic: Negative.   Hematological: Negative.   Psychiatric/Behavioral: Negative.      Social History   Tobacco Use  . Smoking status: Never Smoker  . Smokeless tobacco: Never Used  Substance Use Topics  . Alcohol use: Yes    Alcohol/week: 8.4 oz    Types: 14 Cans of beer per week   Objective:   BP (!) 218/104 (BP Location: Left Arm, Patient Position: Sitting, Cuff Size: Large)   Pulse 71   Wt 291 lb 6.4 oz (132.2 kg)   BMI 36.42 kg/m   Physical Exam  Constitutional: He is oriented to person, place, and time. He appears well-developed and well-nourished.  HENT:  Head: Normocephalic and atraumatic.  Right Ear: External ear normal.  Left Ear: External ear normal.  Mouth/Throat: Oropharynx is clear and moist.  Eyes: Conjunctivae and EOM are normal. Pupils are equal, round, and reactive to light.  Neck: Normal range of motion. Neck supple.  Cardiovascular: Normal rate, regular rhythm, normal heart sounds and intact distal pulses.  Pulmonary/Chest: Effort normal and breath sounds normal.  Abdominal: Soft. Bowel sounds are normal.  Genitourinary: Rectum normal, prostate normal and penis normal.  Musculoskeletal: He exhibits edema and deformity.  Neurological: He is alert and oriented to person, place, and time. He has normal reflexes.  Skin: Skin is warm and dry.  Psychiatric: He has a normal mood and affect. His behavior is normal. Judgment and thought content normal.  Nursing note and vitals reviewed.      Assessment &  Plan:   1. Hypertension associated with diabetes ([place]) BP = 218/104 today. Patient and daughter given much detail of dangers of non-compliance of Hypertension management. Encouraged patient to begin taking BP medications to decrease his risk of stroke and heart attack.  - Lipid Profile - CBC; Future - CBC w/Diff - Comp Met (CMET)  2. Type 2 diabetes mellitus with complication, with long-term current use of insulin (HCC) Refill on Glipizide to pharmacy. Labs drawn today.  - HgB A1c - CBC w/Diff - Comp Met (CMET)  3. Wrist injury,  left, initial encounter Stable. Reports that it is improving. Monitor.   4. Follow up Patient to follow up last week of the month.  - CBC w/Diff - Comp Met (CMET)   Azzie Glatter, FNP   [facility]

## 2017-09-30 LAB — COMPREHENSIVE METABOLIC PANEL
ALT: 8 IU/L (ref 0–44)
AST: 10 IU/L (ref 0–40)
Albumin/Globulin Ratio: 1.2 (ref 1.2–2.2)
Albumin: 3.6 g/dL (ref 3.6–4.8)
Alkaline Phosphatase: 120 IU/L — ABNORMAL HIGH (ref 39–117)
BUN/Creatinine Ratio: 9 — ABNORMAL LOW (ref 10–24)
BUN: 11 mg/dL (ref 8–27)
Bilirubin Total: 0.6 mg/dL (ref 0.0–1.2)
CO2: 20 mmol/L (ref 20–29)
Calcium: 9.2 mg/dL (ref 8.6–10.2)
Chloride: 98 mmol/L (ref 96–106)
Creatinine, Ser: 1.26 mg/dL (ref 0.76–1.27)
GFR calc Af Amer: 70 mL/min/{1.73_m2} (ref >59)
GFR calc non Af Amer: 60 mL/min/{1.73_m2} (ref >59)
Globulin, Total: 2.9 g/dL (ref 1.5–4.5)
Glucose: 383 mg/dL — ABNORMAL HIGH (ref 65–99)
Potassium: 4 mmol/L (ref 3.5–5.2)
Sodium: 135 mmol/L (ref 134–144)
Total Protein: 6.5 g/dL (ref 6.0–8.5)

## 2017-09-30 LAB — CBC WITH DIFFERENTIAL/PLATELET
Basophils Absolute: 0 10*3/uL (ref 0.0–0.2)
Basos: 0 %
EOS (ABSOLUTE): 0.2 10*3/uL (ref 0.0–0.4)
Eos: 3 %
Hematocrit: 44.8 % (ref 37.5–51.0)
Hemoglobin: 15.6 g/dL (ref 13.0–17.7)
Immature Grans (Abs): 0 10*3/uL (ref 0.0–0.1)
Immature Granulocytes: 0 %
Lymphocytes Absolute: 1.5 10*3/uL (ref 0.7–3.1)
Lymphs: 21 %
MCH: 32 pg (ref 26.6–33.0)
MCHC: 34.8 g/dL (ref 31.5–35.7)
MCV: 92 fL (ref 79–97)
Monocytes Absolute: 1.1 10*3/uL — ABNORMAL HIGH (ref 0.1–0.9)
Monocytes: 15 %
Neutrophils Absolute: 4.4 10*3/uL (ref 1.4–7.0)
Neutrophils: 61 %
Platelets: 295 10*3/uL (ref 150–379)
RBC: 4.88 x10E6/uL (ref 4.14–5.80)
RDW: 13.3 % (ref 12.3–15.4)
WBC: 7.2 10*3/uL (ref 3.4–10.8)

## 2017-09-30 LAB — LIPID PANEL
Chol/HDL Ratio: 3.6 ratio (ref 0.0–5.0)
Cholesterol, Total: 171 mg/dL (ref 100–199)
HDL: 47 mg/dL (ref >39)
LDL Calculated: 82 mg/dL (ref 0–99)
Triglycerides: 211 mg/dL — ABNORMAL HIGH (ref 0–149)
VLDL Cholesterol Cal: 42 mg/dL — ABNORMAL HIGH (ref 5–40)

## 2017-09-30 LAB — HEMOGLOBIN A1C
Est. average glucose Bld gHb Est-mCnc: 298 mg/dL
Hgb A1c MFr Bld: 12 % — ABNORMAL HIGH (ref 4.8–5.6)

## 2017-10-12 ENCOUNTER — Ambulatory Visit
Admission: RE | Admit: 2017-10-12 | Discharge: 2017-10-12 | Disposition: A | Discharge status: Home / Self Care | Payer: Self-pay | Source: Ambulatory Visit | Attending: Specialist | Admitting: Specialist

## 2017-10-12 ENCOUNTER — Encounter: Payer: Self-pay | Admitting: Specialist

## 2017-10-12 ENCOUNTER — Ambulatory Visit: Payer: Self-pay | Admitting: Specialist

## 2017-10-12 DIAGNOSIS — X58XXXA Exposure to other specified factors, initial encounter: Secondary | ICD-10-CM | POA: Insufficient documentation

## 2017-10-12 DIAGNOSIS — S52612A Displaced fracture of left ulna styloid process, initial encounter for closed fracture: Secondary | ICD-10-CM | POA: Insufficient documentation

## 2017-10-12 DIAGNOSIS — M25532 Pain in left wrist: Secondary | ICD-10-CM

## 2017-10-12 DIAGNOSIS — R937 Abnormal findings on diagnostic imaging of other parts of musculoskeletal system: Secondary | ICD-10-CM | POA: Insufficient documentation

## 2017-10-12 NOTE — Progress Notes (Signed)
   Subjective:    Patient ID: Raymond Avery, male    DOB: 07/28/1956, 62 y.o.   MRN: [id number]  HPI 62 yr old fell at home 7 weeks ago injuring his left wrist, possible fracture.  He did not seek medical attention. After 3 weeks, his daughter had him get a wrist splint.because of continuing problems,he has come in. He is R hand dominant.   Review of Systems     Objective:   Physical Exam  Wrist diffusely swollen. No erythrema; the hair pattern. He is TTP over the distal radius. ROM limited in dorsiflexion. He only supinates to neutral.       Assessment & Plan:  Plan: xray of wrist. Return to clinic next week with disk.

## 2017-10-13 ENCOUNTER — Telehealth: Payer: Self-pay

## 2017-10-13 NOTE — Telephone Encounter (Signed)
Called Mr. Raymond Avery to let him know the [facility] referral had been sent and he should be hearing something from them soon.

## 2017-10-19 ENCOUNTER — Ambulatory Visit: Payer: Self-pay | Admitting: Specialist

## 2017-10-19 DIAGNOSIS — S62109D Fracture of unspecified carpal bone, unspecified wrist, subsequent encounter for fracture with routine healing: Secondary | ICD-10-CM

## 2017-10-19 NOTE — Progress Notes (Signed)
   Subjective:    Patient ID: Raymond Avery, male    DOB: 06/26/1956, 62 y.o.   MRN: [id number]  HPI X-ray's show commuted minimally  displaced fracture distal radius. My opinion is that the "window of opportunity" has passed to consider an ORIF.   Review of Systems deffered    Objective:   Physical Exam        Assessment & Plan:  Assessment: use splint prn. Plan: refer to PT to be instructed in HEP. To arrange for another appointment for his left knee.

## 2017-10-25 ENCOUNTER — Encounter (INDEPENDENT_AMBULATORY_CARE_PROVIDER_SITE_OTHER): Payer: Self-pay | Admitting: Vascular Surgery

## 2017-10-25 ENCOUNTER — Ambulatory Visit: Payer: Self-pay | Admitting: Adult Health Nurse Practitioner

## 2017-10-25 ENCOUNTER — Ambulatory Visit (INDEPENDENT_AMBULATORY_CARE_PROVIDER_SITE_OTHER): Payer: Self-pay | Admitting: Vascular Surgery

## 2017-10-25 VITALS — BP 170/77 | HR 71 | Resp 18 | Ht 75.5 in | Wt 300.0 lb

## 2017-10-25 VITALS — BP 186/84 | HR 72 | Temp 98.4°F | Wt 302.3 lb

## 2017-10-25 DIAGNOSIS — R600 Localized edema: Secondary | ICD-10-CM

## 2017-10-25 DIAGNOSIS — L03116 Cellulitis of left lower limb: Secondary | ICD-10-CM

## 2017-10-25 DIAGNOSIS — E118 Type 2 diabetes mellitus with unspecified complications: Secondary | ICD-10-CM

## 2017-10-25 DIAGNOSIS — I10 Essential (primary) hypertension: Secondary | ICD-10-CM

## 2017-10-25 DIAGNOSIS — E119 Type 2 diabetes mellitus without complications: Secondary | ICD-10-CM

## 2017-10-25 LAB — GLUCOSE, POCT (MANUAL RESULT ENTRY): POC Glucose: 127 mg/dl — AB (ref 70–99)

## 2017-10-25 MED ORDER — GLIPIZIDE 10 MG PO TABS: 10.0000 mg | ORAL_TABLET | Freq: Every day | ORAL | 3 refills | Status: DC

## 2017-10-25 MED ORDER — HYDRALAZINE HCL 100 MG PO TABS: 100.0000 mg | ORAL_TABLET | Freq: Two times a day (BID) | ORAL | 3 refills | Status: DC

## 2017-10-25 MED ORDER — METFORMIN HCL 500 MG PO TABS: 500.0000 mg | ORAL_TABLET | Freq: Two times a day (BID) | ORAL | 3 refills | Status: DC

## 2017-10-25 NOTE — Progress Notes (Signed)
   Subjective:    Patient ID: Raymond AbtsJohn Hanning, male    DOB: 09/07/1956, 62 y.o.   MRN: [id number]  HPI   Raymond Avery is a 62 yo male here for 1 mo f/u for diabetes. A1c 3 weeks ago was 12, which has increased from 10.5 3974yr ago.   Patient Active Problem List   Diagnosis Date Noted  . Diabetic foot ulcer (HCC) 08/18/2016  . Cellulitis of left foot 06/09/2015  . A-fib (HCC) 01/04/2013  . Diabetes (HCC) 01/04/2013  . Essential hypertension 01/04/2013  . SIRS (systemic inflammatory response syndrome) (HCC) 01/04/2013  . Bacteremia 01/04/2013   Allergies as of 10/25/2017   No Known Allergies     Medication List        Accurate as of 10/25/17  7:25 PM. Always use your most recent med list.          amLODipine 10 MG tablet Commonly known as:  NORVASC Take 10 mg by mouth daily.   aspirin EC 81 MG tablet Take by mouth.   enalapril 20 MG tablet Commonly known as:  VASOTEC Take by mouth.   glipiZIDE 10 MG tablet Commonly known as:  GLUCOTROL Take 1 tablet (10 mg total) by mouth daily before breakfast.   hydrALAZINE 100 MG tablet Commonly known as:  APRESOLINE Take 100 mg by mouth daily.   hydrochlorothiazide 25 MG tablet Commonly known as:  HYDRODIURIL TAKE 2 TABLETS BY MOUTH EVERY DAY   metoprolol tartrate 25 MG tablet Commonly known as:  LOPRESSOR Take 1 tablet (25 mg total) by mouth 2 (two) times daily.        Review of Systems BP has decreased from 65mo ago from 218/104. Pt reports his BP was 170/77 earlier today. Pt  Pt reports he would like to avoid taking insulin for now. He is taking Glipizide 10mg  daily.     Objective:   Physical Exam  Constitutional: He is oriented to person, place, and time. He appears well-developed and well-nourished.  Cardiovascular: Normal rate, regular rhythm and normal heart sounds.  Pulmonary/Chest: Effort normal and breath sounds normal.  Abdominal: Soft. Bowel sounds are normal.  Neurological: He is alert and oriented to  person, place, and time.  Psychiatric: He has a normal mood and affect.  Vitals reviewed.   BP (!) 186/84   Pulse 72   Temp 98.4 F (36.9 C)   Wt (!) 302 lb 4.8 oz (137.1 kg)   BMI 37.29 kg/m   Today CBG is 127 mg/dL.      Assessment & Plan:   Rx Metformin 500mg  BID Increase Hydralazine 100mg  BID  Continue all other medications- discussed that insulin therapy may be needed to reduce A1c- pt is reluctant to add medications.  F/u in 4 weeks for med eval and BP check

## 2017-10-25 NOTE — Progress Notes (Signed)
CBG 127 mg/dL

## 2017-10-25 NOTE — Progress Notes (Signed)
Subjective:    Patient ID: Raymond Avery, male    DOB: 03/24/1956, 62 y.o.   MRN: [id number] Chief Complaint  Patient presents with  . New Patient (Initial Visit)    LE Edema   Presents as a new patient referred by nurse practitioner Bradly ChrisStroud for evaluation of left lower extremity edema.  The patient endorses a long-standing history of edema to the bilateral lower extremity however in the last 6-8 months he has noticed a progressive worsening to the left lower extremity.  The patient has also been diagnosed with cellulitis to the left lower extremity which was treated with PO antibiotics.  The patient notes a discomfort associated with the edema.  The edema worsens as the day progresses or with sitting and standing for long periods of time.  At this point, the patient does not engage in conservative therapy including wearing medical grade 1 compression stockings, elevating his legs and remaining active.  The patient's symptoms have progressed to the point he is unable to function on a daily basis.  The patient denies any rest pain or ulceration to the lower extremity.  The patient does note calf cramping to the bilateral lower extremity with ambulation.  The patient denies any fever, nausea vomiting.   Review of Systems  Constitutional: Negative.   HENT: Negative.   Eyes: Negative.   Respiratory: Negative.   Cardiovascular: Positive for leg swelling.       Claudication Cellulitis  Gastrointestinal: Negative.   Endocrine: Negative.   Genitourinary: Negative.   Musculoskeletal: Negative.   Skin: Negative.   Allergic/Immunologic: Negative.   Neurological: Negative.   Hematological: Negative.   Psychiatric/Behavioral: Negative.       Objective:   Physical Exam  Constitutional: He is oriented to person, place, and time. He appears well-developed and well-nourished. No distress.  HENT:  Head: Normocephalic and atraumatic.  Eyes: Conjunctivae are normal. Pupils are equal, round, and  reactive to light.  Neck: Normal range of motion.  Cardiovascular: Normal rate, regular rhythm, normal heart sounds and intact distal pulses.  Pulses:      Radial pulses are 2+ on the right side, and 2+ on the left side.  Hard to palpate pedal pulses bilaterally however his feet are warm  Pulmonary/Chest: Effort normal and breath sounds normal.  Musculoskeletal: Normal range of motion. He exhibits edema (Right lower extremity: Mild nonpitting edema noted.  Left lower extremity: Moderate 1+ pitting edema noted).  Neurological: He is alert and oriented to person, place, and time.  Skin: He is not diaphoretic.  Minimal less than 1 cm scattered varicosities noted to the bilateral lower extremity.  There is moderate stasis dermatitis noted to the left lower extremity.  Mild stasis dermatitis to the right.  There is no skin changes.  There is no cellulitis.  There are no ulcerations.  Psychiatric: He has a normal mood and affect. His behavior is normal. Judgment and thought content normal.  Vitals reviewed.  BP (!) 170/77 (BP Location: Right Arm, Patient Position: Sitting)   Pulse 71   Resp 18   Ht 6' 3.5" (1.918 m)   Wt 300 lb (136.1 kg)   BMI 37.00 kg/m   Past Medical History:  Diagnosis Date  . A-fib (HCC)   . Arrhythmia   . CHF (congestive heart failure) (HCC)   . Diabetes (HCC)   . Hypertension   . Hyponatremia   . Leukocytosis   . Systemic inflammatory response syndrome (HCC)   . UTI (urinary tract infection)  Social History   Socioeconomic History  . Marital status: Single    Spouse name: Not on file  . Number of children: Not on file  . Years of education: Not on file  . Highest education level: Not on file  Social Needs  . Financial resource strain: Not on file  . Food insecurity - worry: Not on file  . Food insecurity - inability: Not on file  . Transportation needs - medical: Not on file  . Transportation needs - non-medical: Not on file  Occupational History    . Not on file  Tobacco Use  . Smoking status: Never Smoker  . Smokeless tobacco: Never Used  Substance and Sexual Activity  . Alcohol use: Yes    Alcohol/week: 8.4 oz    Types: 14 Cans of beer per week  . Drug use: No  . Sexual activity: Yes    Partners: Female    Birth control/protection: None  Other Topics Concern  . Not on file  Social History Narrative  . Not on file   Past Surgical History:  Procedure Laterality Date  . AMPUTATION TOE Left 06/10/2015   Procedure: Left great toe amputation ;  Surgeon: Linus Galas, MD;  Location: [facility] ORS;  Service: Podiatry;  Laterality: Left;  . IRRIGATION AND DEBRIDEMENT FOOT Right 08/18/2016   Procedure: IRRIGATION AND DEBRIDEMENT FOOT ABSCESS AND BONE;  Surgeon: Linus Galas, DPM;  Location: [facility] ORS;  Service: Podiatry;  Laterality: Right;   Family History  Problem Relation Age of Onset  . Heart attack Mother   . Cancer Father    No Known Allergies     Assessment & Plan:  Presents as a new patient referred by nurse practitioner Bradly Chris for evaluation of left lower extremity edema.  The patient endorses a long-standing history of edema to the bilateral lower extremity however in the last 6-8 months he has noticed a progressive worsening to the left lower extremity.  The patient has also been diagnosed with cellulitis to the left lower extremity which was treated with PO antibiotics.  The patient notes a discomfort associated with the edema.  The edema worsens as the day progresses or with sitting and standing for long periods of time.  At this point, the patient does not engage in conservative therapy including wearing medical grade 1 compression stockings, elevating his legs and remaining active.  The patient's symptoms have progressed to the point he is unable to function on a daily basis.  The patient denies any rest pain or ulceration to the lower extremity.  The patient does note calf cramping to the bilateral lower extremity with  ambulation.  The patient denies any fever, nausea vomiting.  1. Bilateral lower extremity edema - New The patient was encouraged to wear graduated compression stockings (20-30 mmHg) on a daily basis. The patient was instructed to begin wearing the stockings first thing in the morning and removing them in the evening. The patient was instructed specifically not to sleep in the stockings. Prescription given. In addition, behavioral modification including elevation during the day will be initiated. Anti-inflammatories for pain. I will bring the patient back in 1 month to undergo a bilateral lower extremity venous duplex to rule out any contributing reflux The patient will follow up in one months to asses conservative management.  Information on chronic venous insufficiency and compression stockings was given to the patient. The patient was instructed to call the office in the interim if any worsening edema or ulcerations to the legs,  feet or toes occurs. The patient expresses their understanding.  - VAS [place] LOWER EXTREMITY VENOUS REFLUX; Future  2. Cellulitis of left foot - Stable Patient has not had any recent bouts of cellulitis however does have multiple risk factors for peripheral artery disease To palpate pedal pulses on exam I will bring the patient back to undergo an ABI to assess for any contributing peripheral artery disease  - VAS [place] ABI WITH/WO TBI; Future  3. Type 2 diabetes mellitus with complication, unspecified whether long term insulin use (HCC) - Stable Encouraged good control as its slows the progression of atherosclerotic disease  Current Outpatient Medications on File Prior to Visit  Medication Sig Dispense Refill  . aspirin EC 81 MG tablet Take by mouth.    Marland Kitchen glipiZIDE (GLUCOTROL) 10 MG tablet Take 1 tablet (10 mg total) by mouth daily before breakfast. 30 tablet 0  . hydrALAZINE (APRESOLINE) 100 MG tablet Take 100 mg by mouth daily.    . hydrochlorothiazide  (HYDRODIURIL) 25 MG tablet TAKE 2 TABLETS BY MOUTH EVERY DAY 60 tablet 0  . metoprolol tartrate (LOPRESSOR) 25 MG tablet Take 1 tablet (25 mg total) by mouth 2 (two) times daily. 180 tablet 3  . amLODipine (NORVASC) 10 MG tablet Take 10 mg by mouth daily.    . enalapril (VASOTEC) 20 MG tablet Take by mouth.     No current facility-administered medications on file prior to visit.    There are no Patient Instructions on file for this visit. No Follow-up on file.  Nohemy Koop A Forestine Macho, PA-C

## 2017-10-26 ENCOUNTER — Ambulatory Visit: Payer: Self-pay | Admitting: Specialist

## 2017-10-26 DIAGNOSIS — M1712 Unilateral primary osteoarthritis, left knee: Secondary | ICD-10-CM

## 2017-10-26 NOTE — Progress Notes (Signed)
   Subjective:    Patient ID: Raymond Avery, male    DOB: 06/13/1956, 62 y.o.   MRN: [id number]  HPI > 10 yr hx of left knee pain. He worked [employer] but no specific injury. He cannot walk a city block. He does stairs one step at a time. Asc/Des =. [facility] which he holds in his right hand. He has had a series of viscosupplementation injections; with no benefit. Hx of DM 2, he has amputations of bilateral Hallux. He has obvious brawny edema on the left. He has an appt with the vein clinic.   Review of Systems     Objective:   Physical Exam I asked him to ambulate without cane, but he was afraid of falling. With the cane he has a marked antalgic gait. On inspection, his leg alignment is neutral. Mid calf measurements show his left side is 3 cm > than the right. On the right, he has full active extension and on the left he lacks 40 degrees. Supine, his ROM is -20/110. On the right his flex is 130. 0 Eff. Ligaments are  stable.     Assessment & Plan:  IMP: 1. 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 Venous insufficiency   Plan: Xray of left knee  RTC next week

## 2017-10-27 ENCOUNTER — Ambulatory Visit
Admission: RE | Admit: 2017-10-27 | Discharge: 2017-10-27 | Disposition: A | Discharge status: Home / Self Care | Payer: Self-pay | Source: Ambulatory Visit | Attending: Specialist | Admitting: Specialist

## 2017-10-27 ENCOUNTER — Ambulatory Visit: Payer: Self-pay

## 2017-10-27 DIAGNOSIS — M1712 Unilateral primary osteoarthritis, left knee: Secondary | ICD-10-CM

## 2017-11-04 ENCOUNTER — Other Ambulatory Visit: Payer: Self-pay

## 2017-11-04 ENCOUNTER — Encounter: Payer: Commercial Managed Care - HMO | Admitting: Pharmacist

## 2017-11-04 ENCOUNTER — Encounter (INDEPENDENT_AMBULATORY_CARE_PROVIDER_SITE_OTHER): Payer: Self-pay

## 2017-11-04 ENCOUNTER — Encounter: Payer: Self-pay | Admitting: Pharmacy Technician

## 2017-11-04 ENCOUNTER — Ambulatory Visit: Payer: Commercial Managed Care - HMO | Admitting: Pharmacy Technician

## 2017-11-04 VITALS — BP 196/100

## 2017-11-04 DIAGNOSIS — Z79899 Other long term (current) drug therapy: Secondary | ICD-10-CM

## 2017-11-04 NOTE — Progress Notes (Signed)
[facility] Visit Note  Patient: Alexandre Faries MRN: [id number] Date of Birth: Sep 28, 1955 PCP: Patient, No Pcp Per   [name] 62 y.o. male presents for an initial MTM visit today at [facility]   Patient not compliant with medications and refuses to take necessary medications (I.e. Metformin, aspirin, other BP medications). Says he does not like putting chemicals in his body.   BP (!) 196/100   Patient Information   Past Medical History:  Diagnosis Date  . A-fib (HCC)   . Arrhythmia   . CHF (congestive heart failure) (HCC)   . Diabetes (HCC)   . Hypertension   . Hyponatremia   . Leukocytosis   . Systemic inflammatory response syndrome (HCC)   . UTI (urinary tract infection)       Past Surgical History:  Procedure Laterality Date  . AMPUTATION TOE Left 06/10/2015   Procedure: Left great toe amputation ;  Surgeon: Linus Galas, MD;  Location: [facility] OR[facility];  Service: Podiatry;  Laterality: Left;  . IRRIGATION AND DEBRIDEMENT FOOT Right 08/18/2016   Procedure: IRRIGATION AND DEBRIDEMENT FOOT ABSCESS AND BONE;  Surgeon: Linus Galas, DPM;  Location: [facility] ORS;  Service: Podiatry;  Laterality: Right;     Family History  Problem Relation Age of Onset  . Heart attack Mother   . Cancer Father     New Diagnoses (since last visit):    Lifestyle Diet: Breakfast:Coffee (1.5 tsp sugar), eggs, english muffin  Lunch:skips or 'nibbles' throughout the day Dinner: Pasta  Drinks:2 beers/day, juice   Exercise: Patient has had both big toes amputated; uses cane to walk around. Difficulty walking/exercising.     Social History   Substance and Sexual Activity  Alcohol Use Yes  . Alcohol/week: 8.4 oz  . Types: 14 Cans of beer per week      Social History   Tobacco Use  Smoking Status Never Smoker  Smokeless Tobacco Never Used      Health Maintenance  Topic Date Due  . Hepatitis C Screening  03-Mar-1956  . PNEUMOCOCCAL POLYSACCHARIDE  VACCINE (1) 04/27/1956  . OPHTHALMOLOGY EXAM  04/27/1964  . HIV Screening  04/27/1969  . TETANUS/TDAP  04/27/1973  . COLONOSCOPY  04/27/2004  . INFLUENZA VACCINE  04/20/2017  . FOOT EXAM  08/18/2017  . HEMOGLOBIN A1C  03/29/2018     Assessment and Plan:  1. Medication compliance: Poor; patient states does not like taking medications and will take them whenever he feels like it. Inquired as to why he is forgetting to take his medications and patient says it's just because he doesn't like taking them. Offered to provide patient with medication organizer but patient refused.  2. DMII: Uncontrolled; A1c (09/2017) 12%, up from 10.5% a year ago. Only taking Glipizide. Was recently prescribed metformin and refuses to take. Patient does not check blood sugars at home. Counseled on what his A1c of 12% means and talked to him about the importance of metformin (decrease A1c 1-2%) and temporary side effects such as GI disturbance. Patient refuses to take because of reading about lactic acidosis. Talked to him about insulin and he refuses starting. Experiencing micro and macrovascular complications and side effects such as polyuria, polydipsia, diabetic neuropathy (hx of toe amputations). States he has a knee replacement surgery and vascular surgery coming up. Reviewed healthy diet and how I would strongly recommend following a strict healthy diet due to not taking medications and not being able to exercise.   3. BP: Uncontrolled, BP 64/266mmHg at appointment.  Did not take daily medications this morning. Recommended taking medications first thing when he goes home. Denies HA or back pain. Talk about importance of BP control and preventing CVD. Reviewed with patient his goal BP is <140/90 mmHg.   4. AFib: Refuses to take any blood thinners. ASA 81mg  in his medication profile and when asked about he states he stopped taking. I strongly encouraged to restart taking a daily baby ASA and reviewed the importance of  this with his medical history of AFib. Talked to patient about the complications of AFib and why he should be on an anticoagulant--patient refuses.   Overall, patient does not want to take any medications and is experiencing multiple complications because of this.    Will see patient back for a follow-up MTM at [facility] in 3 months.   Cleopatra CedarStephanie Patryk Conant, PharmD Pharmacy Resident

## 2017-11-04 NOTE — Progress Notes (Signed)
Patient stated that he has already completed financial assistance application for [facility], and has been approved for 100% [facility]    Completed [facility] application and contract.  Patient agreed to all terms of the [facility] contract.  Patient informed that he needs to provide 2018 taxes and January 2019 bank statement.    Patient approved for medication assistance at [facility] through 2018, as long as eligibility criteria continues to be met.  .    Provided patient with community resource material based on his particular needs.    Patient referred for MTM.  Resident to complete MTM with patient at the end of eligibility appointment.  Patient stated that he told his doctor at [facility] that he will not take Metformin.  Patient stated that his provider was concerned about his decision to discontinue use of this medication.  Patient stated that he is having trouble keeping his blood sugar under control.  Asked resident to talk to patient about the importance of managing diabetes with medications.  Patient also stated that he needs knee replacement.  Doesn't want to have surgery.  Concerned about whether the knee will heal properly if he does have surgery.  Suggested patient share his concerns with his [facility]

## 2017-11-09 ENCOUNTER — Ambulatory Visit: Payer: Self-pay | Admitting: Specialist

## 2017-11-16 ENCOUNTER — Ambulatory Visit: Payer: Self-pay | Admitting: Specialist

## 2017-11-16 DIAGNOSIS — M1712 Unilateral primary osteoarthritis, left knee: Secondary | ICD-10-CM

## 2017-11-16 NOTE — Progress Notes (Signed)
   Subjective:    Patient ID: Raymond Avery, male    DOB: 05/25/1956, 62 y.o.   MRN: [id number]  HPI Xrays show mild OA. They compared these to xrays from 07/18 which the pt does not recall from ever having??  With this fact maybe it would be worht while to obtain a MRI. We have ordered this and will return after assessment.    Review of Systems  Deferred     Objective:   Physical Exam  Deferred     Assessment & Plan:  Assessment: Mild OA.   Plan: MRI of knee. Return to clinic after MRI.

## 2017-11-22 ENCOUNTER — Ambulatory Visit: Payer: Self-pay | Admitting: Urology

## 2017-11-22 VITALS — BP 199/86 | HR 86 | Temp 97.9°F | Wt 297.1 lb

## 2017-11-22 DIAGNOSIS — R21 Rash and other nonspecific skin eruption: Secondary | ICD-10-CM

## 2017-11-22 MED ORDER — ENALAPRIL MALEATE 20 MG PO TABS: 20.0000 mg | ORAL_TABLET | Freq: Two times a day (BID) | ORAL | 3 refills | Status: DC

## 2017-11-22 NOTE — Progress Notes (Signed)
   Subjective:    Patient ID: Raymond Avery, male    DOB: 06/12/1956, 62 y.o.   MRN: [id number]  HPI  Raymond AbtsJohn Ferraris is a 62 yo male here for f/u of hypertension. BP is elevated tonight at 199/86 and last BP on 2/5 was 186/84. Pt is not taking Amlodipine.  Pt presents tonight w/ insect-like bite on posterior L calf for 2 weeks. Pt says it's painful and burns but is not itchy. Pt denies pus. Diabetes: Last A1c was 12 1 mo ago. Pt began taking Glipizide 2/5. Pt refuses to take Metformin or Insulin.  Pt requests eye exam.  Pt presents w/ umbilical hernia  Patient Active Problem List   Diagnosis Date Noted  . Diabetic foot ulcer (HCC) 08/18/2016  . Cellulitis of left foot 06/09/2015  . A-fib (HCC) 01/04/2013  . Diabetes mellitus without complication (HCC) 01/04/2013  . Essential hypertension 01/04/2013  . SIRS (systemic inflammatory response syndrome) (HCC) 01/04/2013  . Bacteremia 01/04/2013   Allergies as of 11/22/2017   No Known Allergies     Medication List        Accurate as of 11/22/17  7:44 PM. Always use your most recent med list.          amLODipine 10 MG tablet Commonly known as:  NORVASC Take 10 mg by mouth daily.   aspirin EC 81 MG tablet Take by mouth.   enalapril 20 MG tablet Commonly known as:  VASOTEC Take by mouth.   glipiZIDE 10 MG tablet Commonly known as:  GLUCOTROL Take 1 tablet (10 mg total) by mouth daily before breakfast.   hydrALAZINE 100 MG tablet Commonly known as:  APRESOLINE Take 1 tablet (100 mg total) by mouth 2 (two) times daily.   hydrochlorothiazide 25 MG tablet Commonly known as:  HYDRODIURIL TAKE 2 TABLETS BY MOUTH EVERY DAY   metFORMIN 500 MG tablet Commonly known as:  GLUCOPHAGE Take 1 tablet (500 mg total) by mouth 2 (two) times daily with a meal.   metoprolol tartrate 25 MG tablet Commonly known as:  LOPRESSOR Take 1 tablet (25 mg total) by mouth 2 (two) times daily.         Review of Systems     Objective:   Physical Exam  Constitutional: He is oriented to person, place, and time. He appears well-developed and well-nourished.  Abdominal: A hernia is present.  Neurological: He is alert and oriented to person, place, and time.  Vitals reviewed.   BP (!) 199/86 (BP Location: Left Arm, Patient Position: Sitting) Comment: 195/97 second time  Pulse 86   Temp 97.9 F (36.6 C) (Oral)   Wt 297 lb 1.6 oz (134.8 kg)   BMI 36.64 kg/m       Assessment & Plan:   Hypertension: Increase Enalaipril to 20mg  BID. Recheck BP in 1 week.  Diabetes: continue to monitor.  Referral to eye exam.   F/u in 1 week for BP check. F/u in 3 mo

## 2017-11-24 ENCOUNTER — Ambulatory Visit (INDEPENDENT_AMBULATORY_CARE_PROVIDER_SITE_OTHER): Payer: Self-pay

## 2017-11-24 ENCOUNTER — Encounter (INDEPENDENT_AMBULATORY_CARE_PROVIDER_SITE_OTHER): Payer: Self-pay | Admitting: Vascular Surgery

## 2017-11-24 ENCOUNTER — Ambulatory Visit (INDEPENDENT_AMBULATORY_CARE_PROVIDER_SITE_OTHER): Payer: Commercial Managed Care - HMO | Admitting: Vascular Surgery

## 2017-11-24 ENCOUNTER — Other Ambulatory Visit (INDEPENDENT_AMBULATORY_CARE_PROVIDER_SITE_OTHER): Payer: Self-pay | Admitting: Vascular Surgery

## 2017-11-24 VITALS — BP 196/89 | HR 67 | Resp 17 | Wt 294.0 lb

## 2017-11-24 DIAGNOSIS — I872 Venous insufficiency (chronic) (peripheral): Secondary | ICD-10-CM

## 2017-11-24 DIAGNOSIS — L03116 Cellulitis of left lower limb: Secondary | ICD-10-CM

## 2017-11-24 DIAGNOSIS — I890 Lymphedema, not elsewhere classified: Secondary | ICD-10-CM

## 2017-11-24 DIAGNOSIS — R600 Localized edema: Secondary | ICD-10-CM

## 2017-11-24 NOTE — Progress Notes (Signed)
Subjective:    Patient ID: Raymond Avery, male    DOB: 1956-05-22, 62 y.o.   MRN: [id number] Chief Complaint  Patient presents with  . Follow-up    70month bil ven reflux   Patient presents for a monthly bilateral lower extremity edema, discomfort and cellulitis follow-up.  Since our initial visit, the patient has started wearing medical grade one compression stockings and elevating his legs heart level or higher.  The patient notes an improvement in his lower extremity edema and discomfort.  The patient's left lower extremity cellulitis has resolved.  The patient underwent a bilateral ABI which was notable for no significant arterial occlusive disease to the bilateral lower extremity.  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  The patient underwent a bilateral lower extremity venous reflux exam which was notable for reflux in the bilateral common femoral, femoral and popliteal veins.  Reflux was also noted to the right great saphenous vein.  The patient denies any fever, nausea vomiting.   Review of Systems  Constitutional: Negative.   HENT: Negative.   Eyes: Negative.   Respiratory: Negative.   Cardiovascular: Positive for leg swelling.  Gastrointestinal: Negative.   Endocrine: Negative.   Musculoskeletal: Negative.   Skin: Negative.   Allergic/Immunologic: Negative.   Neurological: Negative.   Hematological: Negative.   Psychiatric/Behavioral: Negative.       Objective:   Physical Exam  Constitutional: He is oriented to person, place, and time. He appears well-developed and well-nourished. No distress.  HENT:  Head: Normocephalic and atraumatic.  Eyes: Conjunctivae are normal. Pupils are equal, round, and reactive to light.  Neck: Normal range of motion.  Cardiovascular: Normal rate, regular rhythm, normal heart sounds and intact distal pulses.  Pulses:      Radial pulses  are 2+ on the right side, and 2+ on the left side.  Hard to palpate pedal pulses due to body habitus however the bilateral feet are warm. The patient's left lower extremity cellulitis has resolved.  Pulmonary/Chest: Effort normal and breath sounds normal.  Musculoskeletal: Normal range of motion. He exhibits edema (Minimal edema noted to the bilateral lower extremity).  Neurological: He is alert and oriented to person, place, and time.  Skin: He is not diaphoretic.  Skin is intact.    Psychiatric: He has a normal mood and affect. His behavior is normal. Judgment and thought content normal.  Vitals reviewed.  BP (!) 196/89 (BP Location: Right Arm)   Pulse 67   Resp 17   Wt 294 lb (133.4 kg)   BMI 36.26 kg/m   Past Medical History:  Diagnosis Date  . A-fib (HCC)   . Arrhythmia   . CHF (congestive heart failure) (HCC)   . Diabetes (HCC)   . Hypertension   . Hyponatremia   . Leukocytosis   . Systemic inflammatory response syndrome (HCC)   . UTI (urinary tract infection)    Social History   Socioeconomic History  . Marital status: Single    Spouse name: Not on file  . Number of children: Not on file  . Years of education: Not on file  . Highest education level: Not on file  Social Needs  . Financial resource strain: Not on file  . Food insecurity - worry: Not on file  . Food insecurity - inability: Not on file  . Transportation needs - medical: Not on file  . Transportation needs - non-medical: Not  on file  Occupational History  . Not on file  Tobacco Use  . Smoking status: Never Smoker  . Smokeless tobacco: Never Used  Substance and Sexual Activity  . Alcohol use: Yes    Alcohol/week: 8.4 oz    Types: 14 Cans of beer per week  . Drug use: No  . Sexual activity: Yes    Partners: Female    Birth control/protection: None  Other Topics Concern  . Not on file  Social History Narrative  . Not on file   Past Surgical History:  Procedure Laterality Date  .  AMPUTATION TOE Left 06/10/2015   Procedure: Left great toe amputation ;  Surgeon: Linus Galas, MD;  Location: [facility] ORS;  Service: Podiatry;  Laterality: Left;  . IRRIGATION AND DEBRIDEMENT FOOT Right 08/18/2016   Procedure: IRRIGATION AND DEBRIDEMENT FOOT ABSCESS AND BONE;  Surgeon: Linus Galas, DPM;  Location: [facility] ORS;  Service: Podiatry;  Laterality: Right;   Family History  Problem Relation Age of Onset  . Heart attack Mother   . Cancer Father    No Known Allergies     Assessment & Plan:  Patient presents for a monthly bilateral lower extremity edema, discomfort and cellulitis follow-up.  Since our initial visit, the patient has started wearing medical grade one compression stockings and elevating his legs heart level or higher.  The patient notes an improvement in his lower extremity edema and discomfort.  The patient's left lower extremity cellulitis has resolved.  The patient underwent a bilateral ABI which was notable for no significant arterial occlusive disease to the bilateral lower extremity.  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  The patient underwent a bilateral lower extremity venous reflux exam which was notable for reflux in the bilateral common femoral, femoral and popliteal veins.  Reflux was also noted to the right great saphenous vein.  The patient denies any fever, nausea vomiting.  1. Chronic venous insufficiency - New Patient found to have reflux in the bilateral deep system Due to the location of his reflux he is not a candidate for laser ablation to the common femoral, femoral or popliteal vein reflux areas The patient does have reflux noted to the great saphenous vein however at this time he is not interested in moving forward with laser ablation The patient is to continue engaging in conservative therapy including wearing his medical grade 1 compression socks and  elevating his legs on a daily basis heart level or higher The patient was encouraged to follow-up in 3 months however he would like to call the office as needed  2. Lymphedema - New As above We discussed the addition of a lymphedema pump as an added therapy however at this time he is not interested in moving forward with laser ablation The patient is to continue engaging in conservative therapy including wearing his medical grade 1 compression socks and elevating his legs on a daily basis heart level or higher The patient was encouraged to follow-up in 3 months however he would like to call the office as needed  3. Cellulitis of left foot - Resolved The patient's cellulitis has resolved  Current Outpatient Medications on File Prior to Visit  Medication Sig Dispense Refill  . amLODipine (NORVASC) 10 MG tablet Take 10 mg by mouth daily.    Marland Kitchen aspirin EC 81 MG tablet Take by mouth.    . enalapril (VASOTEC) 20 MG tablet Take  1 tablet (20 mg total) by mouth 2 (two) times daily. 90 tablet 3  . glipiZIDE (GLUCOTROL) 10 MG tablet Take 1 tablet (10 mg total) by mouth daily before breakfast. 30 tablet 3  . hydrALAZINE (APRESOLINE) 100 MG tablet Take 1 tablet (100 mg total) by mouth 2 (two) times daily. 60 tablet 3  . hydrochlorothiazide (HYDRODIURIL) 25 MG tablet TAKE 2 TABLETS BY MOUTH EVERY DAY 60 tablet 0  . metoprolol tartrate (LOPRESSOR) 25 MG tablet Take 1 tablet (25 mg total) by mouth 2 (two) times daily. 180 tablet 3  . metFORMIN (GLUCOPHAGE) 500 MG tablet Take 1 tablet (500 mg total) by mouth 2 (two) times daily with a meal. (Patient not taking: Reported on 11/04/2017) 60 tablet 3   No current facility-administered medications on file prior to visit.    There are no Patient Instructions on file for this visit. No Follow-up on file.  Ashley Bultema A Lasharon Dunivan, PA-C

## 2017-12-01 ENCOUNTER — Ambulatory Visit: Payer: Self-pay | Admitting: Adult Health Nurse Practitioner

## 2017-12-01 ENCOUNTER — Telehealth: Payer: Self-pay | Admitting: Adult Health Nurse Practitioner

## 2017-12-01 VITALS — BP 169/80

## 2017-12-01 DIAGNOSIS — Z0130 Encounter for examination of blood pressure without abnormal findings: Secondary | ICD-10-CM

## 2017-12-01 NOTE — Progress Notes (Signed)
Pt came in for a bp check. Today it was 169/80

## 2017-12-01 NOTE — Telephone Encounter (Signed)
Called pt to discuss BP check, and to set up a new apt to change medication. Left message.

## 2017-12-01 NOTE — Telephone Encounter (Signed)
Pt called back, apt was set up for 12/08/17

## 2017-12-08 ENCOUNTER — Ambulatory Visit: Payer: Self-pay | Admitting: Adult Health

## 2017-12-08 ENCOUNTER — Ambulatory Visit: Payer: Self-pay | Admitting: Ophthalmology

## 2017-12-08 ENCOUNTER — Encounter: Payer: Self-pay | Admitting: Adult Health

## 2017-12-08 VITALS — BP 160/80 | Temp 98.9°F | Wt 296.7 lb

## 2017-12-08 DIAGNOSIS — I10 Essential (primary) hypertension: Principal | ICD-10-CM

## 2017-12-08 DIAGNOSIS — M25562 Pain in left knee: Secondary | ICD-10-CM

## 2017-12-08 DIAGNOSIS — I739 Peripheral vascular disease, unspecified: Secondary | ICD-10-CM

## 2017-12-08 DIAGNOSIS — E119 Type 2 diabetes mellitus without complications: Secondary | ICD-10-CM

## 2017-12-08 DIAGNOSIS — I152 Hypertension secondary to endocrine disorders: Secondary | ICD-10-CM

## 2017-12-08 DIAGNOSIS — E1140 Type 2 diabetes mellitus with diabetic neuropathy, unspecified: Secondary | ICD-10-CM

## 2017-12-08 DIAGNOSIS — E1159 Type 2 diabetes mellitus with other circulatory complications: Secondary | ICD-10-CM

## 2017-12-08 LAB — HM DIABETES EYE EXAM

## 2017-12-08 MED ORDER — HYDRALAZINE HCL 100 MG PO TABS: 100.0000 mg | ORAL_TABLET | Freq: Three times a day (TID) | ORAL | 3 refills | Status: DC

## 2017-12-08 NOTE — Progress Notes (Signed)
Patient: Raymond Avery Male    DOB: 1956/08/16   62 y.o.   MRN: [id number] Visit Date: 12/08/2017  Today's Provider: Tally Joe, NP   Chief Complaint  Patient presents with  . Follow-up  Diabetes and hypertension Subjective:    HPI  62 y/o male who presents for routine follow-up of hypertension and diabetes. Offers no complaints. He does not check his blood glucose and blood pressure. His blood pressure is still high today. No hypoglycemic episodes reported. He had his Diabetic foot exam about 1 year ago. He will prefer to follow-up with his podiatrist. Diabetic eye exam is today. Reports taking all medications as prescribed.   No Known Allergies Previous Medications   AMLODIPINE (NORVASC) 10 MG TABLET    Take 10 mg by mouth daily.   ASPIRIN EC 81 MG TABLET    Take by mouth.   ENALAPRIL (VASOTEC) 20 MG TABLET    Take 1 tablet (20 mg total) by mouth 2 (two) times daily.   GLIPIZIDE (GLUCOTROL) 10 MG TABLET    Take 1 tablet (10 mg total) by mouth daily before breakfast.   HYDRALAZINE (APRESOLINE) 100 MG TABLET    Take 1 tablet (100 mg total) by mouth 2 (two) times daily.   HYDROCHLOROTHIAZIDE (HYDRODIURIL) 25 MG TABLET    TAKE 2 TABLETS BY MOUTH EVERY DAY   METFORMIN (GLUCOPHAGE) 500 MG TABLET    Take 1 tablet (500 mg total) by mouth 2 (two) times daily with a meal.   METOPROLOL TARTRATE (LOPRESSOR) 25 MG TABLET    Take 1 tablet (25 mg total) by mouth 2 (two) times daily.    Review of Systems  Constitutional: Positive for fatigue. Negative for appetite change, chills and fever.  HENT: Positive for congestion. Negative for sinus pressure, sinus pain, sneezing and sore throat.   Eyes: Positive for visual disturbance (blurred vision).  Respiratory: Negative for cough, chest tightness and shortness of breath.   Cardiovascular: Positive for leg swelling. Negative for chest pain and palpitations.  Gastrointestinal: Negative for abdominal distention, constipation, diarrhea and nausea.   Endocrine: Negative for polydipsia, polyphagia and polyuria.  Musculoskeletal: Positive for gait problem and joint swelling (left knee pain and swelling).  Skin: Positive for color change (lower extremities).  Neurological: Negative for dizziness, numbness and headaches.       Neuropathy    Social History   Tobacco Use  . Smoking status: Never Smoker  . Smokeless tobacco: Never Used  Substance Use Topics  . Alcohol use: Yes    Alcohol/week: 8.4 oz    Types: 14 Cans of beer per week   Objective:   BP (!) 160/80   Temp 98.9 F (37.2 C)   Wt 296 lb 11.2 oz (134.6 kg)   BMI 36.60 kg/m   Physical Exam  Constitutional: He is oriented to person, place, and time. He appears well-developed and well-nourished.  Eyes: Pupils are equal, round, and reactive to light. Conjunctivae and EOM are normal.  Neck: Normal range of motion. Neck supple.  Cardiovascular: Normal rate and normal heart sounds.  Pulses diminished in BLLE  Pulmonary/Chest: He has no wheezes. He has no rales.  Abdominal: Soft. Bowel sounds are normal.  Musculoskeletal: He exhibits edema (+3 EDEMA in LLE) and deformity (LEFT KNEE SWELLING WITH LIMITED ROM).  Neurological: He is alert and oriented to person, place, and time. He has normal reflexes.  Skin: Skin is warm and dry. There is erythema (venous stasis discoloration in BLLE, left >right).  Psychiatric:  He has a normal mood and affect.      Assessment & Plan:  Hypertension associated with diabetes (HCC)-Uncontrolled  Plan: continue vasotec and HCTZ. Increased hydralazine to 100mg  TID  Type 2 diabetes mellitus with diabetic neuropathy, without long-term current use of insulin (HCC)  Plan: Patient was not taking glucotrol prior to last HgA1C. Will continue current dose and repeat HgB A1c in 1 month  Type 2 diabetes mellitus without complication, without long-term current use of insulin (HCC)  Plan: Glucotrol and check HgB A1c. Patient encouraged to follow a low  carb low simple sugars diet and check blood glucose at home  PAD (peripheral artery disease) (HCC) Plan:TED stockings, blood pressure and glycemic control.  Left knee pain, unspecified chronicity Plan: f/u with ortho. OTC pain remedies. Fall precautions and safety reviewed   Tally JoeMagddalene S Tukov, NP   [facility]

## 2017-12-16 ENCOUNTER — Ambulatory Visit
Admission: RE | Admit: 2017-12-16 | Discharge: 2017-12-16 | Disposition: A | Discharge status: Home / Self Care | Payer: Disability Insurance | Source: Ambulatory Visit | Attending: Specialist | Admitting: Specialist

## 2017-12-16 DIAGNOSIS — M6250 Muscle wasting and atrophy, not elsewhere classified, unspecified site: Secondary | ICD-10-CM | POA: Insufficient documentation

## 2017-12-16 DIAGNOSIS — M25462 Effusion, left knee: Secondary | ICD-10-CM | POA: Insufficient documentation

## 2017-12-16 DIAGNOSIS — R609 Edema, unspecified: Secondary | ICD-10-CM | POA: Insufficient documentation

## 2017-12-16 DIAGNOSIS — M1712 Unilateral primary osteoarthritis, left knee: Secondary | ICD-10-CM | POA: Insufficient documentation

## 2017-12-16 DIAGNOSIS — M7122 Synovial cyst of popliteal space [Baker], left knee: Secondary | ICD-10-CM | POA: Insufficient documentation

## 2017-12-16 DIAGNOSIS — S83282A Other tear of lateral meniscus, current injury, left knee, initial encounter: Secondary | ICD-10-CM | POA: Insufficient documentation

## 2017-12-16 DIAGNOSIS — X58XXXA Exposure to other specified factors, initial encounter: Secondary | ICD-10-CM | POA: Insufficient documentation

## 2017-12-28 ENCOUNTER — Ambulatory Visit: Payer: Self-pay | Admitting: Specialist

## 2017-12-28 ENCOUNTER — Other Ambulatory Visit: Payer: Self-pay | Admitting: Ophthalmology

## 2017-12-28 DIAGNOSIS — M25562 Pain in left knee: Secondary | ICD-10-CM

## 2017-12-28 NOTE — Progress Notes (Signed)
   Subjective:    Patient ID: Raymond Avery, male    DOB: 05/01/1956, 62 y.o.   MRN: [id number]  HPI MRI is consistent with Degenerative tearing of the lateral meniscus.  I have explained that there is no good surgical options for this. Incidently, his leg swelling looks remarkably better. He has had viscosupplementation injections in the past to no avail. He would be willing to have a TKA. Clinically, his function is more limited that one would expect from the imaging.    Review of Systems deferred    Objective:   Physical Exam Deferred       Assessment & Plan:  Plan: [facility] L knee. Return to clinic PRN.

## 2017-12-29 ENCOUNTER — Telehealth: Payer: Self-pay

## 2017-12-29 NOTE — Telephone Encounter (Signed)
[facility] called and stated they did receive the referral they just have a long wait right now. They called the pt and informed him of this.

## 2018-01-05 ENCOUNTER — Other Ambulatory Visit: Payer: Self-pay

## 2018-01-05 DIAGNOSIS — I739 Peripheral vascular disease, unspecified: Secondary | ICD-10-CM

## 2018-01-05 DIAGNOSIS — E119 Type 2 diabetes mellitus without complications: Secondary | ICD-10-CM

## 2018-01-05 DIAGNOSIS — I10 Essential (primary) hypertension: Principal | ICD-10-CM

## 2018-01-05 DIAGNOSIS — E1159 Type 2 diabetes mellitus with other circulatory complications: Secondary | ICD-10-CM

## 2018-01-05 DIAGNOSIS — I152 Hypertension secondary to endocrine disorders: Secondary | ICD-10-CM

## 2018-01-05 DIAGNOSIS — E1140 Type 2 diabetes mellitus with diabetic neuropathy, unspecified: Secondary | ICD-10-CM

## 2018-01-06 LAB — SPECIMEN STATUS REPORT

## 2018-01-10 LAB — CBC
Hematocrit: 42 % (ref 37.5–51.0)
Hemoglobin: 14.4 g/dL (ref 13.0–17.7)
MCH: 30 pg (ref 26.6–33.0)
MCHC: 34.3 g/dL (ref 31.5–35.7)
MCV: 88 fL (ref 79–97)
Platelets: 276 10*3/uL (ref 150–379)
RBC: 4.8 x10E6/uL (ref 4.14–5.80)
RDW: 13.8 % (ref 12.3–15.4)
WBC: 9.2 10*3/uL (ref 3.4–10.8)

## 2018-01-10 LAB — SPECIMEN STATUS REPORT

## 2018-01-10 LAB — HEMOGLOBIN A1C
ESTIMATED AVERAGE GLUCOSE: 140 mg/dL
HEMOGLOBIN A1C: 6.5 % — AB (ref 4.8–5.6)

## 2018-01-12 ENCOUNTER — Other Ambulatory Visit: Payer: Self-pay | Admitting: Adult Health Nurse Practitioner

## 2018-01-12 ENCOUNTER — Encounter: Payer: Self-pay | Admitting: Adult Health

## 2018-01-12 ENCOUNTER — Ambulatory Visit: Payer: Self-pay | Admitting: Adult Health

## 2018-01-12 VITALS — BP 171/80 | HR 58 | Temp 97.6°F | Ht 75.5 in | Wt 292.6 lb

## 2018-01-12 DIAGNOSIS — M25562 Pain in left knee: Secondary | ICD-10-CM

## 2018-01-12 DIAGNOSIS — E119 Type 2 diabetes mellitus without complications: Secondary | ICD-10-CM

## 2018-01-12 DIAGNOSIS — I499 Cardiac arrhythmia, unspecified: Secondary | ICD-10-CM | POA: Insufficient documentation

## 2018-01-12 DIAGNOSIS — G589 Mononeuropathy, unspecified: Secondary | ICD-10-CM | POA: Insufficient documentation

## 2018-01-12 DIAGNOSIS — I8390 Asymptomatic varicose veins of unspecified lower extremity: Secondary | ICD-10-CM | POA: Insufficient documentation

## 2018-01-12 DIAGNOSIS — I10 Essential (primary) hypertension: Secondary | ICD-10-CM

## 2018-01-12 MED ORDER — METOPROLOL TARTRATE 25 MG PO TABS: 25.0000 mg | ORAL_TABLET | Freq: Every day | ORAL | 3 refills | Status: DC

## 2018-01-12 MED ORDER — ENALAPRIL MALEATE 20 MG PO TABS: 20.0000 mg | ORAL_TABLET | Freq: Two times a day (BID) | ORAL | 3 refills | Status: DC

## 2018-01-12 NOTE — Progress Notes (Signed)
Patient: Raymond Avery Male    DOB: 11/21/1955   62 y.o.   MRN: [id number] Visit Date: 01/12/2018  Today's Provider: Tally JoeMagddalene S Tukov, NP   No chief complaint on file.  Subjective:    HPI This is a 62 y/o male who presents for s routine follow-up visit for hypertension and diabetes HgA1C 6.5%, down from 12.0% in January 2019. He is on glipizide 10 mg daily. Left knee pain persistent. Appointment with orthopedic surgery on 4/29. His blood pressure is elevated. He is not taking his BP medications as prescribed. He offers no other complaints   Recent Vital Signs /Orders  There were no vitals taken for this visit.   Past Medical History   Past Medical History:  Diagnosis Date  . A-fib (HCC)   . Arrhythmia   . CHF (congestive heart failure) (HCC)   . Diabetes (HCC)   . Hypertension   . Hyponatremia   . Leukocytosis   . Systemic inflammatory response syndrome (HCC)   . UTI (urinary tract infection)      Expected Discharge Date     Diet  No diet orders on file   VTE Documentation      Work Intensity Score     Mobility     Consult Orders     CBG Orders     Abnormal Labs    Raymond Avery 01/12/2018, 12:20 PM  No Known Allergies Previous Medications   ASPIRIN EC 81 MG TABLET    Take by mouth.   ENALAPRIL (VASOTEC) 20 MG TABLET    Take 1 tablet (20 mg total) by mouth 2 (two) times daily.   GLIPIZIDE (GLUCOTROL) 10 MG TABLET    Take 1 tablet (10 mg total) by mouth daily before breakfast.   HYDRALAZINE (APRESOLINE) 100 MG TABLET    Take 1 tablet (100 mg total) by mouth 3 (three) times daily.   HYDROCHLOROTHIAZIDE (HYDRODIURIL) 25 MG TABLET    TAKE 2 TABLETS BY MOUTH EVERY DAY   METOPROLOL TARTRATE (LOPRESSOR) 25 MG TABLET    Take 1 tablet (25 mg total) by mouth 2 (two) times daily.    Review of Systems  Constitutional: Negative.   Respiratory: Negative.   Cardiovascular: Negative.   Musculoskeletal: Positive for arthralgias (left knee pain), gait  problem (uses cane) and joint swelling (left knee).    Social History   Tobacco Use  . Smoking status: Never Smoker  . Smokeless tobacco: Never Used  Substance Use Topics  . Alcohol use: Yes    Alcohol/week: 8.4 oz    Types: 14 Cans of beer per week   Objective:   There were no vitals taken for this visit.  Physical Exam  Constitutional: He is oriented to person, place, and time. He appears well-developed and well-nourished.  Eyes: Pupils are equal, round, and reactive to light.  Cardiovascular: Normal rate, regular rhythm, normal heart sounds and intact distal pulses.  Pulmonary/Chest: Effort normal and breath sounds normal.  Abdominal: Soft. Bowel sounds are normal.  Musculoskeletal: He exhibits tenderness.  Left knee with limited ROM and pain with flexion and extension  Neurological: He is alert and oriented to person, place, and time.      Assessment & Plan:   1. Diabetes mellitus without complication (HCC) Well controlled. Continue glipizide  2. Essential hypertension Uncontrolled due to non-adherence. Need for medication adherence reviewed with patient. Will not change any of his medications at this time. Will adjust medication admin times to help with adherence. Patient encouraged  to monitor and log BPs. I have asked him to RTC if his sbp is consistently >160  3. Left knee pain, unspecified chronicity F/u with ortho. OTC pain remedies as tolerated   Tally Joe, NP   [facility] [place]

## 2018-01-16 ENCOUNTER — Ambulatory Visit (INDEPENDENT_AMBULATORY_CARE_PROVIDER_SITE_OTHER): Payer: Disability Insurance | Admitting: Physician Assistant

## 2018-01-16 ENCOUNTER — Encounter (INDEPENDENT_AMBULATORY_CARE_PROVIDER_SITE_OTHER): Payer: Self-pay | Admitting: Physician Assistant

## 2018-01-16 VITALS — Ht 75.5 in | Wt 292.6 lb

## 2018-01-16 DIAGNOSIS — M1712 Unilateral primary osteoarthritis, left knee: Secondary | ICD-10-CM

## 2018-01-16 MED ORDER — METHYLPREDNISOLONE ACETATE 40 MG/ML IJ SUSP
40.0000 mg | INTRAMUSCULAR | Status: AC | PRN
  Administered 2018-01-16: 40 mg via INTRA_ARTICULAR

## 2018-01-16 MED ORDER — LIDOCAINE HCL 1 % IJ SOLN
5.0000 mL | INTRAMUSCULAR | Status: AC | PRN
  Administered 2018-01-16: 5 mL

## 2018-01-16 NOTE — Progress Notes (Signed)
Office Visit Note   Patient: Raymond Avery           Date of Birth: 04-16-1956           MRN: [id number] Visit Date: 01/16/2018              Requested by: No referring provider defined for this encounter. PCP: Patient, No Pcp Per   Assessment & Plan: Visit Diagnoses:  1. Primary osteoarthritis of left knee     Plan:  Quad strengthening exercises reviewed with the patient.  Knee friendly exercises also reviewed.  He is to be mindful of his glucose levels over the next couple of days.  Spoke with patient at length today about the MRI findings and length and I do not feel that knee arthroscopy be overall beneficial for him.  This point time like to try cortisone injection in the left knee and see how he does with this.  Also explained to him that it would be beneficial if he can work on range of motion strengthening of the left knee articulates quad muscles.  Follow-Up Instructions: Return in about 2 weeks (around 01/30/2018) for Dr. Magnus Ivan.   Orders:  Orders Placed This Encounter  Procedures  . Large Joint Inj: L knee   No orders of the defined types were placed in this encounter.     Procedures: Large Joint Inj: L knee on 01/16/2018 1:41 PM Indications: pain Details: 22 G 1.5 in needle, anterolateral approach  Arthrogram: No  Medications: 40 mg methylPREDNISolone acetate 40 MG/ML; 5 mL lidocaine 1 % Aspirate: 17 mL yellow Outcome: tolerated well, no immediate complications Procedure, treatment alternatives, risks and benefits explained, specific risks discussed. Consent was given by the patient. Immediately prior to procedure a time out was called to verify the correct patient, procedure, equipment, support staff and site/side marked as required. Patient was prepped and draped in the usual sterile fashion.       Clinical Data: No additional findings.   Subjective: Chief Complaint  Patient presents with  . Left Knee - Pain    HPI Mr. Raymond Avery is a 62 year old  male comes in today with chronic left knee pain.  Pains been present for the past 14 years.  He states he has had numerous small injuries to the left knee over the years.  Having giving way of the left knee and has to use a cane.  No locking catching or painful popping.  Said supplemental injections in the left knee in the past that did not help.  He is never had a cortisone injection he is diabetic with good control.  He did undergo an MRI of his left knee dated 12/16/2017.  This showed Sharon tearing of the anterior horn, mid body and posterior horn of the lateral meniscus.  Moderate osteoarthritis lateral compartment.  Small knee joint effusion.  Grade 1 degenerative signal medial meniscus without discrete tear.  Also plain films dated 10/27/17 are reviewed and showed some mild degenerative joint disease with narrowing with the medial lateral joint line. Review of Systems Please see HPI otherwise negative  Objective: Vital Signs: Ht 6' 3.5" (1.918 m)   Wt 292 lb 9.6 oz (132.7 kg)   BMI 36.09 kg/m   Physical Exam  Constitutional: He is oriented to person, place, and time. He appears well-developed and well-nourished. No distress.  Pulmonary/Chest: Effort normal.  Neurological: He is alert and oriented to person, place, and time.  Skin: He is not diaphoretic.  Psychiatric: He has a  normal mood and affect.    Ortho Exam Bilateral knees he has full extension passively and lacks approximately 5 to 7 degrees full extension actively with flexion to approximately 110 degrees degrees.  Tenderness along the left lateral joint line.  No erythema edema or  ecchymosis either knee.  No instability valgus varus stressing of either knee.  Left knee with slight effusion.  Bilateral calves are supple.  He has edematous changes of both lower legs. Specialty Comments:  No specialty comments available.  Imaging: No results found.   PMFS History: Patient Active Problem List   Diagnosis Date Noted  .  Arrhythmia 01/12/2018  . 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 Obesity, unspecified 01/12/2018  . Varicose veins 01/12/2018  . Lymphedema 11/24/2017  . Chronic venous insufficiency 11/24/2017  . Medical non-compliance 09/29/2016  . Cellulitis of left foot 06/09/2015  . Erectile dysfunction associated with type 2 diabetes mellitus (HCC) 08/19/2014  . Arthrosis of knee 05/24/2014  . ED (erectile dysfunction) 04/16/2014  . Microalbuminuria 12/03/2013  . Essential (primary) hypertension 12/01/2013  . Diabetes mellitus, type II (HCC) 11/16/2013  . Diabetes mellitus without complication (HCC) 01/04/2013  . Essential hypertension 01/04/2013  . SIRS (systemic inflammatory response syndrome) (HCC) 01/04/2013  . Bacteremia 01/04/2013   Past Medical History:  Diagnosis Date  . A-fib (HCC)   . Arrhythmia   . CHF (congestive heart failure) (HCC)   . Diabetes (HCC)   . Hypertension   . Hyponatremia   . Leukocytosis   . Systemic inflammatory response syndrome (HCC)   . UTI (urinary tract infection)     Family History  Problem Relation Age of Onset  . Heart attack Mother   . Cancer Father     Past Surgical History:  Procedure Laterality Date  . AMPUTATION TOE Left 06/10/2015   Procedure: Left great toe amputation ;  Surgeon: Linus Galas, MD;  Location: [facility] ORS;  Service: Podiatry;  Laterality: Left;  . IRRIGATION AND DEBRIDEMENT FOOT Right 08/18/2016   Procedure: IRRIGATION AND DEBRIDEMENT FOOT ABSCESS AND BONE;  Surgeon: Linus Galas, DPM;  Location: [facility] ORS;  Service: Podiatry;  Laterality: Right;   Social History   Occupational History  . Not on file  Tobacco Use  . Smoking status: Never Smoker  . Smokeless tobacco: Never Used  Substance and Sexual Activity  . Alcohol use: Yes    Alcohol/week: 8.4 oz    Types: 14 Cans of beer per week  . Drug use: No  . Sexual activity: Yes    Partners: Female    Birth control/protection: None

## 2018-01-30 ENCOUNTER — Ambulatory Visit (INDEPENDENT_AMBULATORY_CARE_PROVIDER_SITE_OTHER): Payer: Self-pay | Admitting: Orthopaedic Surgery

## 2018-01-30 ENCOUNTER — Ambulatory Visit (INDEPENDENT_AMBULATORY_CARE_PROVIDER_SITE_OTHER): Payer: Self-pay

## 2018-01-30 ENCOUNTER — Encounter (INDEPENDENT_AMBULATORY_CARE_PROVIDER_SITE_OTHER): Payer: Self-pay | Admitting: Orthopaedic Surgery

## 2018-01-30 DIAGNOSIS — M25552 Pain in left hip: Secondary | ICD-10-CM

## 2018-01-30 DIAGNOSIS — M1712 Unilateral primary osteoarthritis, left knee: Secondary | ICD-10-CM

## 2018-01-30 DIAGNOSIS — M1612 Unilateral primary osteoarthritis, left hip: Secondary | ICD-10-CM | POA: Insufficient documentation

## 2018-01-30 NOTE — Progress Notes (Signed)
Office Visit Note   Patient: Raymond Avery           Date of Birth: 10/24/1955           MRN: [id number] Visit Date: 01/30/2018              Requested by: No referring provider defined for this encounter. PCP: Patient, No Pcp Per   Assessment & Plan: Visit Diagnoses:  1. Pain in left hip   2. Primary osteoarthritis of left knee   3. Unilateral primary osteoarthritis, left hip     Plan: I do feel that his hip arthritis is quite severe in the left side and this is what is contributing to his left knee pain.  This is correlated with his x-rays and clinical exam of his left hip as well.  At this point the only option would be hip replacement surgery other than doing nothing and he said that is unacceptable to him since his mobility is been decreased quite a bit and his pain is 10 out of 10.  This is detrimentally affect is active daily living, his quality of life, mobility.  I went over his x-rays with him in detail.  There is no joint space left but a steroid injection in at this point.  He is Artie worked on his activity modification and weight loss as well as strengthening exercises.  He is been on anti-inflammatories as well.  I showed him a hip model and went over his x-rays in detail and explained in detail what hip replacement surgery entails including a long thorough discussion about his intraoperative and postoperative course as well as the risk and benefits of surgery.  He does wish to have this set up in the near future and I agree.  I do feel this is gone a help alleviate a lot of his knee issues as well.  Follow-Up Instructions: Return for 2 weeks post-op.   Orders:  Orders Placed This Encounter  Procedures  . XR HIP UNILAT W OR W/O PELVIS 2-3 VIEWS LEFT   No orders of the defined types were placed in this encounter.     Procedures: No procedures performed   Clinical Data: No additional findings.   Subjective: Chief Complaint  Patient presents with  . Right Knee -  Routine Post Op  Although it says the patient was following up for his right knee is actually following up for his left knee.  He was sent to [place] with an MRI of his left knee showing some mild arthritic changes with a lateral meniscal tear all his pain is been on the medial aspect of his knee and a steroid injection only worked minimally.  The MRI of his knee did not show any aspect of full-thickness cartilage loss but just some moderate thinning throughout the knee.  Upon talk with him further he said that his biggest complaint was that he came across his legs because he says stiff.  I had him demonstrate this to me need to tell that he is having debilitating pain more so in his left hip than his left knee itself.  He does ambulate with a cane.  He is a diabetic with a hemoglobin A1c of below 7.  He is willing to get back to more mobility.  He has been having problems for well over a year now.  He is tried activity modification and weight loss.  He is tried anti-inflammatories and home exercise program.  Again he has had at  least a left knee injection with a steroid.  HPI  Review of Systems He currently denies any headache, chest pain, shortness of breath, fever, chills, nausea, vomiting.  Objective: Vital Signs: There were no vitals taken for this visit.  Physical Exam He is alert and oriented x3 and in no acute distress Ortho Exam I examined his hips first and examination of his right hip shows fluid and full range of motion.  His images left hip shows essentially almost no internal and external rotation with significant pain in the groin.  He cannot cross his leg on that side and he cannot put his shoe and sock on.  His knee exam showed just some mild medial joint line tenderness but no effusion with good range of motion but painful. Specialty Comments:  No specialty comments available.  Imaging: Xr Hip Unilat W Or W/o Pelvis 2-3 Views Left  Result Date: 01/30/2018 An AP pelvis and lateral  left hip show severe end-stage arthritis of left hip.  Right hip has only minimal arthritic changes.  Left hip shows complete loss of joint space with cystic changes in the femoral head and acetabulum as well as sclerotic changes.    PMFS History: Patient Active Problem List   Diagnosis Date Noted  . Unilateral primary osteoarthritis, left hip 01/30/2018  . Arrhythmia 01/12/2018  . 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 Obesity, unspecified 01/12/2018  . Varicose veins 01/12/2018  . Lymphedema 11/24/2017  . Chronic venous insufficiency 11/24/2017  . Medical non-compliance 09/29/2016  . Cellulitis of left foot 06/09/2015  . Erectile dysfunction associated with type 2 diabetes mellitus (HCC) 08/19/2014  . Arthrosis of knee 05/24/2014  . ED (erectile dysfunction) 04/16/2014  . Microalbuminuria 12/03/2013  . Essential (primary) hypertension 12/01/2013  . Diabetes mellitus, type II (HCC) 11/16/2013  . Diabetes mellitus without complication (HCC) 01/04/2013  . Essential hypertension 01/04/2013  . SIRS (systemic inflammatory response syndrome) (HCC) 01/04/2013  . Bacteremia 01/04/2013   Past Medical History:  Diagnosis Date  . A-fib (HCC)   . Arrhythmia   . CHF (congestive heart failure) (HCC)   . Diabetes (HCC)   . Hypertension   . Hyponatremia   . Leukocytosis   . Systemic inflammatory response syndrome (HCC)   . UTI (urinary tract infection)     Family History  Problem Relation Age of Onset  . Heart attack Mother   . Cancer Father     Past Surgical History:  Procedure Laterality Date  . AMPUTATION TOE Left 06/10/2015   Procedure: Left great toe amputation ;  Surgeon: Linus Galas, MD;  Location: [facility] ORS;  Service: Podiatry;  Laterality: Left;  . IRRIGATION AND DEBRIDEMENT FOOT Right 08/18/2016   Procedure: IRRIGATION AND DEBRIDEMENT FOOT ABSCESS AND BONE;  Surgeon: Linus Galas, DPM;  Location: [facility] ORS;  Service: Podiatry;  Laterality: Right;   Social History   Occupational History    . Not on file  Tobacco Use  . Smoking status: Never Smoker  . Smokeless tobacco: Never Used  Substance and Sexual Activity  . Alcohol use: Yes    Alcohol/week: 8.4 oz    Types: 14 Cans of beer per week  . Drug use: No  . Sexual activity: Yes    Partners: Female    Birth control/protection: None

## 2018-02-01 ENCOUNTER — Other Ambulatory Visit (INDEPENDENT_AMBULATORY_CARE_PROVIDER_SITE_OTHER): Payer: Self-pay

## 2018-02-01 ENCOUNTER — Other Ambulatory Visit (INDEPENDENT_AMBULATORY_CARE_PROVIDER_SITE_OTHER): Payer: Self-pay | Admitting: Physician Assistant

## 2018-02-02 ENCOUNTER — Encounter: Payer: Self-pay | Admitting: Pharmacist

## 2018-02-02 ENCOUNTER — Other Ambulatory Visit: Payer: Self-pay

## 2018-02-02 ENCOUNTER — Ambulatory Visit: Payer: Self-pay | Admitting: Pharmacist

## 2018-02-02 ENCOUNTER — Encounter (HOSPITAL_COMMUNITY): Payer: Self-pay

## 2018-02-02 VITALS — BP 140/76 | Ht 75.5 in | Wt 298.0 lb

## 2018-02-02 DIAGNOSIS — Z79899 Other long term (current) drug therapy: Secondary | ICD-10-CM

## 2018-02-02 NOTE — Progress Notes (Addendum)
[facility] Visit Note  Patient: Raymond Avery MRN: [id number] Date of Birth: 1956/04/11 PCP: Patient, No Pcp Per   [name] 62 y.o. male presents for a MTM visit today. Has not had any sick contacts or recent travel outside [place].   BP 140/76 (BP Location: Right Arm, Patient Position: Sitting, Cuff Size: Large)   Wt 298 lb (135.2 kg)   BMI 36.76 kg/m   Patient Information   Past Medical History:  Diagnosis Date  . A-fib (HCC)   . Arrhythmia   . CHF (congestive heart failure) (HCC)   . Diabetes (HCC)   . Hypertension   . Hyponatremia   . Leukocytosis   . Systemic inflammatory response syndrome (HCC)   . UTI (urinary tract infection)       Past Surgical History:  Procedure Laterality Date  . AMPUTATION TOE Left 06/10/2015   Procedure: Left great toe amputation ;  Surgeon: Linus Galas, MD;  Location: [facility] ORS;  Service: Podiatry;  Laterality: Left;  . IRRIGATION AND DEBRIDEMENT FOOT Right 08/18/2016   Procedure: IRRIGATION AND DEBRIDEMENT FOOT ABSCESS AND BONE;  Surgeon: Linus Galas, DPM;  Location: [facility] ORS;  Service: Podiatry;  Laterality: Right;     Family History  Problem Relation Age of Onset  . Heart attack Mother   . Cancer Father       Lifestyle Diet: Breakfast:Coffee, cookie or doughnut. 1 doughnut per day Lunch: hamburger with salad or fries. Lots of vegetables or fruits.  Drinks: Water and beer. Has beer everyday Patient cooks all of his own meals and has 2 meals per day   Exercise: Patient isn't very active at this time. He is schedule to have hip surgery next week and currently walks around on a cane. He does work in his shop shed but doesn't do much moving around.   Outpatient Encounter Medications as of 02/02/2018  Medication Sig  . enalapril (VASOTEC) 20 MG tablet Take 1 tablet (20 mg total) by mouth 2 (two) times daily.  Marland Kitchen glipiZIDE (GLUCOTROL) 10 MG tablet TAKE ONE TABLET BY MOUTH EVERY DAY BEFORE BREAKFAST  . hydrALAZINE  (APRESOLINE) 100 MG tablet Take 1 tablet (100 mg total) by mouth 3 (three) times daily.  . hydrochlorothiazide (HYDRODIURIL) 25 MG tablet TAKE 2 TABLETS BY MOUTH EVERY DAY (Patient taking differently: Take 25 mg by mouth twice daily)  . metoprolol succinate (TOPROL-XL) 25 MG 24 hr tablet Take 25 mg by mouth daily.  . metoprolol tartrate (LOPRESSOR) 25 MG tablet Take 1 tablet (25 mg total) by mouth daily. (Patient not taking: Reported on 02/01/2018)  . [DISCONTINUED] aspirin EC 81 MG tablet Take by mouth.   No facility-administered encounter medications on file as of 02/02/2018.    Patient does not take any OTC or herbal medications at this time          Social History   Substance and Sexual Activity  Alcohol Use Yes  . Alcohol/week: 8.4 oz  . Types: 14 Cans of beer per week      Social History   Tobacco Use  Smoking Status Never Smoker  Smokeless Tobacco Never Used      Health Maintenance  Topic Date Due  . Hepatitis C Screening  09/17/1956  . PNEUMOCOCCAL POLYSACCHARIDE VACCINE (1) 04/27/1956  . HIV Screening  04/27/1969  . TETANUS/TDAP  04/27/1973  . COLONOSCOPY  04/27/2004  . FOOT EXAM  08/18/2017  . INFLUENZA VACCINE  12/09/2018 (Originally 04/20/2018)  . HEMOGLOBIN A1C  07/07/2018  . OPHTHALMOLOGY  EXAM  12/09/2018     Assessment and Plan:  Medication adherence/compliance: Patient knows the doses of the medication and when to take them but not the names. He does admit to skipping his afternoon dose of hydralazine but does take his morning and evening dose of hydralazine. He may skip 1 dose of meds per week but seems to be doing well so far. Did ask if he wanted a pill box and he declined offer. Suggested goal of of increased compliance with medication. Pt seemed motivated to be able to get off medication.  HTN: Patient is on enalapril  BID, hydralazine  TID, HCTZ  1tab BID, and Metoprolol  QD. Tried to ask patient which one (succinate vs tartrate)  but  patient did know. However the med list shows he is taking Toprol-XL and not the Lopressor. (metoprolol has not been dispensed at [facility]). His BP today was 140/76 with medication which is within goal. Patient does consume "a lot" of beer which he knows increases his blood pressure. Patient reports no side effect with meds at this time.   DM: Patient is on Glipizide  QD. HgA1c has decreased from 12.0 in Jan 2019 to 6.5 on 01/05/18. Pt stated A1c was high when he did not have access to his medication. Patient reports no side effects with med at this time.Did no report any hypoglycemia episodeds  Overall patient is doing well and is excited for his hip surgery next week. Want to follow up with patient in 6 months to assess progress and new meds.   [facility] [place] PharmD Candidate 2019  Cosigned:Christan Leonor Liv, PharmD, RPh [facility]) [phone]

## 2018-02-02 NOTE — Patient Instructions (Addendum)
Raymond Avery  02/02/2018   Your procedure is scheduled on: 02-10-18  Report to [facility] Main  Entrance             Report to admitting at       1145 AM     Call this number if you have problems the morning of surgery [phone]    Remember: Do not eat food  :After Midnight.you may have clear liquids until 0815 then nothing by mouth     CLEAR LIQUID DIET   Foods Allowed                                                                     Foods Excluded  Coffee and tea, regular and decaf                             liquids that you cannot  Plain Jell-O in any flavor                                             see through such as: Fruit ices (not with fruit pulp)                                     milk, soups, orange juice  Iced Popsicles                                    All solid food Carbonated beverages, regular and diet                                    Cranberry, grape and apple juices Sports drinks like Gatorade Lightly seasoned clear broth or consume(fat free) Sugar, honey syrup  Sample Menu Breakfast                                Lunch                                     Supper Cranberry juice                    Beef broth                            Chicken broth Jell-O                                     Grape juice  Apple juice Coffee or tea                        Jell-O                                      Popsicle                                                Coffee or tea                        Coffee or tea  _____________________________________________________________________     Take these medicines the morning of surgery with A SIP OF WATER: hydralazine, metoprolol                                You may not have any metal on your body including hair pins and              piercings  Do not wear jewelry,  lotions, powders or perfumes, deodorant                      Men may shave face and  neck.   Do not bring valuables to the hospital. [facility] IS NOT             RESPONSIBLE   FOR VALUABLES.  Contacts, dentures or bridgework may not be worn into surgery.  Leave suitcase in the car. After surgery it may be brought to your room.               Please read over the following fact sheets you were given: _____________________________________________________________________      [facility] - Preparing for Surgery Before surgery, you can play an important role.  Because skin is not sterile, your skin needs to be as free of germs as possible.  You can reduce the number of germs on your skin by washing with CHG (chlorahexidine gluconate) soap before surgery.  CHG is an antiseptic cleaner which kills germs and bonds with the skin to continue killing germs even after washing. Please DO NOT use if you have an allergy to CHG or antibacterial soaps.  If your skin becomes reddened/irritated stop using the CHG and inform your nurse when you arrive at Short Stay. Do not shave (including legs and underarms) for at least 48 hours prior to the first CHG shower.  You may shave your face/neck. Please follow these instructions carefully:  1.  Shower with CHG Soap the night before surgery and the  morning of Surgery.  2.  If you choose to wash your hair, wash your hair first as usual with your  normal  shampoo.  3.  After you shampoo, rinse your hair and body thoroughly to remove the  shampoo.                           4.  Use CHG as you would any other liquid soap.  You can apply chg directly  to the skin and wash  Gently with a scrungie or clean washcloth.  5.  Apply the CHG Soap to your body ONLY FROM THE NECK DOWN.   Do not use on face/ open                           Wound or open sores. Avoid contact with eyes, ears mouth and genitals (private parts).                       Wash face,  Genitals (private parts) with your normal soap.             6.  Wash thoroughly, paying  special attention to the area where your surgery  will be performed.  7.  Thoroughly rinse your body with warm water from the neck down.  8.  DO NOT shower/wash with your normal soap after using and rinsing off  the CHG Soap.                9.  Pat yourself dry with a clean towel.            10.  Wear clean pajamas.            11.  Place clean sheets on your bed the night of your first shower and do not  sleep with pets. Day of Surgery : Do not apply any lotions/deodorants the morning of surgery.  Please wear clean clothes to the hospital/surgery center.  FAILURE TO FOLLOW THESE INSTRUCTIONS MAY RESULT IN THE CANCELLATION OF YOUR SURGERY PATIENT SIGNATURE_________________________________  NURSE SIGNATURE__________________________________  ________________________________________________________________________  WHAT IS A BLOOD TRANSFUSION? Blood Transfusion Information  A transfusion is the replacement of blood or some of its parts. Blood is made up of multiple cells which provide different functions.  Red blood cells carry oxygen and are used for blood loss replacement.  White blood cells fight against infection.  Platelets control bleeding.  Plasma helps clot blood.  Other blood products are available for specialized needs, such as hemophilia or other clotting disorders. BEFORE THE TRANSFUSION  Who gives blood for transfusions?   Healthy volunteers who are fully evaluated to make sure their blood is safe. This is blood bank blood. Transfusion therapy is the safest it has ever been in the practice of medicine. Before blood is taken from a donor, a complete history is taken to make sure that person has no history of diseases nor engages in risky social behavior (examples are intravenous drug use or sexual activity with multiple partners). The donor's travel history is screened to minimize risk of transmitting infections, such as malaria. The donated blood is tested for signs of  infectious diseases, such as HIV and hepatitis. The blood is then tested to be sure it is compatible with you in order to minimize the chance of a transfusion reaction. If you or a relative donates blood, this is often done in anticipation of surgery and is not appropriate for emergency situations. It takes many days to process the donated blood. RISKS AND COMPLICATIONS Although transfusion therapy is very safe and saves many lives, the main dangers of transfusion include:   Getting an infectious disease.  Developing a transfusion reaction. This is an allergic reaction to something in the blood you were given. Every precaution is taken to prevent this. The decision to have a blood transfusion has been considered carefully by your caregiver before blood is given. Blood is not given unless the benefits  outweigh the risks. AFTER THE TRANSFUSION  Right after receiving a blood transfusion, you will usually feel much better and more energetic. This is especially true if your red blood cells have gotten low (anemic). The transfusion raises the level of the red blood cells which carry oxygen, and this usually causes an energy increase.  The nurse administering the transfusion will monitor you carefully for complications. HOME CARE INSTRUCTIONS  No special instructions are needed after a transfusion. You may find your energy is better. Speak with your caregiver about any limitations on activity for underlying diseases you may have. SEEK MEDICAL CARE IF:   Your condition is not improving after your transfusion.  You develop redness or irritation at the intravenous (IV) site. SEEK IMMEDIATE MEDICAL CARE IF:  Any of the following symptoms occur over the next 12 hours:  Shaking chills.  You have a temperature by mouth above 102 F (38.9 C), not controlled by medicine.  Chest, back, or muscle pain.  People around you feel you are not acting correctly or are confused.  Shortness of breath or  difficulty breathing.  Dizziness and fainting.  You get a rash or develop hives.  You have a decrease in urine output.  Your urine turns a dark color or changes to pink, red, or brown. Any of the following symptoms occur over the next 10 days:  You have a temperature by mouth above 102 F (38.9 C), not controlled by medicine.  Shortness of breath.  Weakness after normal activity.  The white part of the eye turns yellow (jaundice).  You have a decrease in the amount of urine or are urinating less often.  Your urine turns a dark color or changes to pink, red, or brown. Document Released: 09/03/2000 Document Revised: 11/29/2011 Document Reviewed: 04/22/2008 ExitCare Patient Information 2014 [place].  _______________________________________________________________________  Incentive Spirometer  An incentive spirometer is a tool that can help keep your lungs clear and active. This tool measures how well you are filling your lungs with each breath. Taking long deep breaths may help reverse or decrease the chance of developing breathing (pulmonary) problems (especially infection) following:  A long period of time when you are unable to move or be active. BEFORE THE PROCEDURE   If the spirometer includes an indicator to show your best effort, your nurse or respiratory therapist will set it to a desired goal.  If possible, sit up straight or lean slightly forward. Try not to slouch.  Hold the incentive spirometer in an upright position. INSTRUCTIONS FOR USE  1. Sit on the edge of your bed if possible, or sit up as far as you can in bed or on a chair. 2. Hold the incentive spirometer in an upright position. 3. Breathe out normally. 4. Place the mouthpiece in your mouth and seal your lips tightly around it. 5. Breathe in slowly and as deeply as possible, raising the piston or the ball toward the top of the column. 6. Hold your breath for 3-5 seconds or for as long as  possible. Allow the piston or ball to fall to the bottom of the column. 7. Remove the mouthpiece from your mouth and breathe out normally. 8. Rest for a few seconds and repeat Steps 1 through 7 at least 10 times every 1-2 hours when you are awake. Take your time and take a few normal breaths between deep breaths. 9. The spirometer may include an indicator to show your best effort. Use the indicator as a goal to  work toward during each repetition. 10. After each set of 10 deep breaths, practice coughing to be sure your lungs are clear. If you have an incision (the cut made at the time of surgery), support your incision when coughing by placing a pillow or rolled up towels firmly against it. Once you are able to get out of bed, walk around indoors and cough well. You may stop using the incentive spirometer when instructed by your caregiver.  RISKS AND COMPLICATIONS  Take your time so you do not get dizzy or light-headed.  If you are in pain, you may need to take or ask for pain medication before doing incentive spirometry. It is harder to take a deep breath if you are having pain. AFTER USE  Rest and breathe slowly and easily.  It can be helpful to keep track of a log of your progress. Your caregiver can provide you with a simple table to help with this. If you are using the spirometer at home, follow these instructions: SEEK MEDICAL CARE IF:   You are having difficultly using the spirometer.  You have trouble using the spirometer as often as instructed.  Your pain medication is not giving enough relief while using the spirometer.  You develop fever of 100.5 F (38.1 C) or higher. SEEK IMMEDIATE MEDICAL CARE IF:   You cough up bloody sputum that had not been present before.  You develop fever of 102 F (38.9 C) or greater.  You develop worsening pain at or near the incision site. MAKE SURE YOU:   Understand these instructions.  Will watch your condition.  Will get help right  away if you are not doing well or get worse. Document Released: 01/17/2007 Document Revised: 11/29/2011 Document Reviewed: 03/20/2007 [facility] Patient Information 2014 [place], [place].   ________________________________________________________________________

## 2018-02-03 ENCOUNTER — Encounter (HOSPITAL_COMMUNITY)
Admission: RE | Admit: 2018-02-03 | Discharge: 2018-02-03 | Disposition: A | Discharge status: Home / Self Care | Payer: Self-pay | Source: Ambulatory Visit | Attending: Orthopaedic Surgery | Admitting: Orthopaedic Surgery

## 2018-02-03 ENCOUNTER — Encounter (HOSPITAL_COMMUNITY): Payer: Self-pay

## 2018-02-03 ENCOUNTER — Other Ambulatory Visit: Payer: Self-pay

## 2018-02-03 DIAGNOSIS — Z0183 Encounter for blood typing: Secondary | ICD-10-CM | POA: Insufficient documentation

## 2018-02-03 DIAGNOSIS — M1612 Unilateral primary osteoarthritis, left hip: Secondary | ICD-10-CM | POA: Insufficient documentation

## 2018-02-03 DIAGNOSIS — R9431 Abnormal electrocardiogram [ECG] [EKG]: Secondary | ICD-10-CM | POA: Insufficient documentation

## 2018-02-03 DIAGNOSIS — I452 Bifascicular block: Secondary | ICD-10-CM | POA: Insufficient documentation

## 2018-02-03 DIAGNOSIS — Z01812 Encounter for preprocedural laboratory examination: Secondary | ICD-10-CM | POA: Insufficient documentation

## 2018-02-03 DIAGNOSIS — Z01818 Encounter for other preprocedural examination: Secondary | ICD-10-CM | POA: Insufficient documentation

## 2018-02-03 HISTORY — DX: Sleep apnea, unspecified: G47.30

## 2018-02-03 HISTORY — DX: Unspecified osteoarthritis, unspecified site: M19.90

## 2018-02-03 LAB — BASIC METABOLIC PANEL
Anion gap: 12 (ref 5–15)
BUN: 19 mg/dL (ref 6–20)
CO2: 23 mmol/L (ref 22–32)
CREATININE: 1.14 mg/dL (ref 0.61–1.24)
Calcium: 9.2 mg/dL (ref 8.9–10.3)
Chloride: 104 mmol/L (ref 101–111)
GFR calc Af Amer: >60 mL/min (ref >60)
Glucose, Bld: 124 mg/dL — ABNORMAL HIGH (ref 65–99)
POTASSIUM: 4.1 mmol/L (ref 3.5–5.1)
SODIUM: 139 mmol/L (ref 135–145)

## 2018-02-03 LAB — SURGICAL PCR SCREEN
MRSA, PCR: NEGATIVE
Staphylococcus aureus: POSITIVE — AB

## 2018-02-03 LAB — CBC
HCT: 41.7 % (ref 39.0–52.0)
Hemoglobin: 14.2 g/dL (ref 13.0–17.0)
MCH: 29.5 pg (ref 26.0–34.0)
MCHC: 34.1 g/dL (ref 30.0–36.0)
MCV: 86.7 fL (ref 78.0–100.0)
PLATELETS: 245 10*3/uL (ref 150–400)
RBC: 4.81 MIL/uL (ref 4.22–5.81)
RDW: 13.5 % (ref 11.5–15.5)
WBC: 10.9 10*3/uL — AB (ref 4.0–10.5)

## 2018-02-03 LAB — GLUCOSE, CAPILLARY: Glucose-Capillary: 138 mg/dL — ABNORMAL HIGH (ref 65–99)

## 2018-02-03 LAB — ABO/RH: ABO/RH(D): O NEG

## 2018-02-06 ENCOUNTER — Telehealth: Payer: Self-pay | Admitting: Pharmacy Technician

## 2018-02-06 NOTE — Telephone Encounter (Signed)
Received updated proof of income.  Patient eligible to receive medication assistance at [facility] through 2019, as long as eligibility requirements continue to be met.  [facility]

## 2018-02-06 NOTE — Progress Notes (Signed)
Final ekg 02-10-18 epic  hgba1c 01-05-18 epic

## 2018-02-09 ENCOUNTER — Encounter: Payer: Self-pay | Admitting: Adult Health

## 2018-02-09 ENCOUNTER — Ambulatory Visit: Payer: Self-pay | Admitting: Adult Health

## 2018-02-09 VITALS — BP 153/79 | HR 65 | Temp 97.9°F | Wt 290.9 lb

## 2018-02-09 DIAGNOSIS — M1710 Unilateral primary osteoarthritis, unspecified knee: Secondary | ICD-10-CM

## 2018-02-09 DIAGNOSIS — M1612 Unilateral primary osteoarthritis, left hip: Secondary | ICD-10-CM

## 2018-02-09 DIAGNOSIS — E119 Type 2 diabetes mellitus without complications: Secondary | ICD-10-CM

## 2018-02-09 DIAGNOSIS — I10 Essential (primary) hypertension: Secondary | ICD-10-CM

## 2018-02-09 MED ORDER — TRANEXAMIC ACID 1000 MG/10ML IV SOLN
1000.0000 mg | INTRAVENOUS | Status: AC
  Administered 2018-02-10: 1000 mg via INTRAVENOUS
  Filled 2018-02-09: qty 1100

## 2018-02-09 MED ORDER — HYDRALAZINE HCL 100 MG PO TABS: 100.0000 mg | ORAL_TABLET | Freq: Three times a day (TID) | ORAL | 3 refills | Status: DC

## 2018-02-09 MED ORDER — ENALAPRIL MALEATE 20 MG PO TABS
20.0000 mg | ORAL_TABLET | Freq: Two times a day (BID) | ORAL | 3 refills | Status: DC
Start: 2018-02-09 — End: 2018-06-22

## 2018-02-09 MED ORDER — HYDROCHLOROTHIAZIDE 25 MG PO TABS: ORAL_TABLET | ORAL | 3 refills | Status: DC

## 2018-02-09 MED ORDER — GLIPIZIDE 10 MG PO TABS: ORAL_TABLET | ORAL | 3 refills | Status: DC

## 2018-02-09 MED ORDER — METOPROLOL SUCCINATE ER 25 MG PO TB24: 25.0000 mg | ORAL_TABLET | Freq: Every day | ORAL | 3 refills | Status: DC

## 2018-02-09 MED ORDER — CEFAZOLIN SODIUM 10 G IJ SOLR
3.0000 g | INTRAMUSCULAR | Status: AC
  Administered 2018-02-10: 3 g via INTRAVENOUS
  Filled 2018-02-09: qty 3

## 2018-02-09 NOTE — Progress Notes (Signed)
Called pt. And LVM for new time of surgery . Arrive to admitting at 1045 am and clears until 0715 am.Left phone number with pt.  In case  he had any questions.

## 2018-02-09 NOTE — Progress Notes (Signed)
Pt. Called me back and voiced understanding of time change and arrival time of 1045 am.

## 2018-02-09 NOTE — Progress Notes (Signed)
  Patient: Raymond Avery Male    DOB: [birth year]   62 y.o.   MRN: [id number] Visit Date: 02/09/2018  Today's Provider: Shawn Route, NP   No chief complaint on file.  Subjective:    HPI  This is a 62 y/o male who presents for routine follow-up of diabetes, hypertension, PVD, CHF and OA of the left hip and knee. He reports doing well. He does not check his blood sugar and blood pressure at home. His blood pressure is elevated today. He offers no other complaints. He is due for a hip surgery tomorrrow.   No Known Allergies Previous Medications   ENALAPRIL (VASOTEC) 20 MG TABLET    Take 1 tablet (20 mg total) by mouth 2 (two) times daily.   GLIPIZIDE (GLUCOTROL) 10 MG TABLET    TAKE ONE TABLET BY MOUTH EVERY DAY BEFORE BREAKFAST   HYDRALAZINE (APRESOLINE) 100 MG TABLET    Take 1 tablet (100 mg total) by mouth 3 (three) times daily.   HYDROCHLOROTHIAZIDE (HYDRODIURIL) 25 MG TABLET    TAKE 2 TABLETS BY MOUTH EVERY DAY   METOPROLOL SUCCINATE (TOPROL-XL) 25 MG 24 HR TABLET    Take 25 mg by mouth daily.    Review of Systems  Constitutional: Negative.   Respiratory: Negative.   Cardiovascular: Negative.   Gastrointestinal: Negative.   Endocrine: Negative.   Musculoskeletal: Positive for arthralgias and gait problem.  Skin: Negative.     Social History   Tobacco Use  . Smoking status: Never Smoker  . Smokeless tobacco: Never Used  Substance Use Topics  . Alcohol use: Yes    Alcohol/week: 8.4 oz    Types: 14 Cans of beer per week    Comment: beer  daily   Objective:   BP (!) 153/79 (BP Location: Left Arm, Patient Position: Sitting)   Pulse 65   Temp 97.9 F (36.6 C) (Oral)   Wt 290 lb 14.4 oz (132 kg)   BMI 35.88 kg/m   Physical Exam  Constitutional: He is oriented to person, place, and time. He appears well-developed and well-nourished.  Cardiovascular: Normal rate, regular rhythm and normal heart sounds.  Pulmonary/Chest: Effort normal and breath sounds normal.   Abdominal: Soft. Bowel sounds are normal.  Musculoskeletal: He exhibits tenderness (with left knee and hip ROM).  Neurological: He is alert and oriented to person, place, and time.  Skin: Skin is warm and dry.      Assessment & Plan:  1. Essential hypertension Well-controlled.  Continue current medications  2. Diabetes mellitus without complication (HCC) Well-controlled.  Continue current medications.  3. Arthrosis of knee Following up with Ortho  4. Unilateral primary osteoarthritis, left hip Due for surgery in the morning.  Return to the clinic 1 month postop.  Patient encouraged to continue adherence with medications especially antidiabetic medications to ensure optimal healing  Shawn Route, NP   [facility] [place]

## 2018-02-10 ENCOUNTER — Inpatient Hospital Stay (HOSPITAL_COMMUNITY): Payer: Self-pay | Admitting: Anesthesiology

## 2018-02-10 ENCOUNTER — Other Ambulatory Visit: Payer: Self-pay

## 2018-02-10 ENCOUNTER — Encounter (HOSPITAL_COMMUNITY): Payer: Self-pay | Admitting: Anesthesiology

## 2018-02-10 ENCOUNTER — Inpatient Hospital Stay (HOSPITAL_COMMUNITY)
Admission: RE | Admit: 2018-02-10 | Discharge: 2018-02-13 | DRG: 470 | Disposition: A | Discharge status: Home Health | Payer: Self-pay | Source: Ambulatory Visit | Attending: Orthopaedic Surgery | Admitting: Orthopaedic Surgery

## 2018-02-10 ENCOUNTER — Encounter (HOSPITAL_COMMUNITY)
Admission: RE | Disposition: A | Discharge status: Home Health | Payer: Self-pay | Source: Ambulatory Visit | Attending: Orthopaedic Surgery

## 2018-02-10 ENCOUNTER — Inpatient Hospital Stay (HOSPITAL_COMMUNITY): Payer: Self-pay

## 2018-02-10 DIAGNOSIS — Z89422 Acquired absence of other left toe(s): Secondary | ICD-10-CM

## 2018-02-10 DIAGNOSIS — I4891 Unspecified atrial fibrillation: Secondary | ICD-10-CM | POA: Diagnosis present

## 2018-02-10 DIAGNOSIS — G4730 Sleep apnea, unspecified: Secondary | ICD-10-CM | POA: Diagnosis present

## 2018-02-10 DIAGNOSIS — M1612 Unilateral primary osteoarthritis, left hip: Principal | ICD-10-CM | POA: Diagnosis present

## 2018-02-10 DIAGNOSIS — I509 Heart failure, unspecified: Secondary | ICD-10-CM | POA: Diagnosis present

## 2018-02-10 DIAGNOSIS — I110 Hypertensive heart disease with heart failure: Secondary | ICD-10-CM | POA: Diagnosis present

## 2018-02-10 DIAGNOSIS — E119 Type 2 diabetes mellitus without complications: Secondary | ICD-10-CM | POA: Diagnosis present

## 2018-02-10 DIAGNOSIS — Z96642 Presence of left artificial hip joint: Secondary | ICD-10-CM

## 2018-02-10 DIAGNOSIS — Z7982 Long term (current) use of aspirin: Secondary | ICD-10-CM

## 2018-02-10 DIAGNOSIS — Z79899 Other long term (current) drug therapy: Secondary | ICD-10-CM

## 2018-02-10 DIAGNOSIS — Z9181 History of falling: Secondary | ICD-10-CM

## 2018-02-10 DIAGNOSIS — Z7984 Long term (current) use of oral hypoglycemic drugs: Secondary | ICD-10-CM

## 2018-02-10 DIAGNOSIS — Z419 Encounter for procedure for purposes other than remedying health state, unspecified: Secondary | ICD-10-CM

## 2018-02-10 HISTORY — PX: TOTAL HIP ARTHROPLASTY: SHX124

## 2018-02-10 LAB — TYPE AND SCREEN
ABO/RH(D): O NEG
ANTIBODY SCREEN: NEGATIVE

## 2018-02-10 LAB — GLUCOSE, CAPILLARY
Glucose-Capillary: 158 mg/dL — ABNORMAL HIGH (ref 65–99)
Glucose-Capillary: 142 mg/dL — ABNORMAL HIGH (ref 65–99)

## 2018-02-10 SURGERY — ARTHROPLASTY, HIP, TOTAL, ANTERIOR APPROACH
Anesthesia: Spinal | Site: Hip | Laterality: Left

## 2018-02-10 MED ORDER — HYDROMORPHONE HCL 1 MG/ML IJ SOLN
INTRAMUSCULAR | Status: AC
  Filled 2018-02-10: qty 1

## 2018-02-10 MED ORDER — HYDRALAZINE HCL 50 MG PO TABS
100.0000 mg | ORAL_TABLET | Freq: Three times a day (TID) | ORAL | Status: DC
  Administered 2018-02-10 – 2018-02-13 (×8): 100 mg via ORAL
  Filled 2018-02-10 (×9): qty 2

## 2018-02-10 MED ORDER — ONDANSETRON HCL 4 MG/2ML IJ SOLN: 4.0000 mg | Freq: Four times a day (QID) | INTRAMUSCULAR | Status: DC | PRN

## 2018-02-10 MED ORDER — FENTANYL CITRATE (PF) 100 MCG/2ML IJ SOLN
INTRAMUSCULAR | Status: DC | PRN
  Administered 2018-02-10: 100 ug via INTRAVENOUS

## 2018-02-10 MED ORDER — HYDROMORPHONE HCL 1 MG/ML IJ SOLN
0.5000 mg | INTRAMUSCULAR | Status: DC | PRN
  Administered 2018-02-10: 1 mg via INTRAVENOUS
  Filled 2018-02-10: qty 1

## 2018-02-10 MED ORDER — PROPOFOL 10 MG/ML IV BOLUS
INTRAVENOUS | Status: AC
  Filled 2018-02-10: qty 20

## 2018-02-10 MED ORDER — GLYCOPYRROLATE 0.2 MG/ML IJ SOLN
INTRAMUSCULAR | Status: DC | PRN
  Administered 2018-02-10: 0.2 mg via INTRAVENOUS

## 2018-02-10 MED ORDER — OXYCODONE HCL 5 MG PO TABS
5.0000 mg | ORAL_TABLET | ORAL | Status: DC | PRN
  Administered 2018-02-10 – 2018-02-13 (×9): 10 mg via ORAL
  Filled 2018-02-10 (×9): qty 2

## 2018-02-10 MED ORDER — DEXAMETHASONE SODIUM PHOSPHATE 10 MG/ML IJ SOLN
INTRAMUSCULAR | Status: AC
  Filled 2018-02-10: qty 1

## 2018-02-10 MED ORDER — METOCLOPRAMIDE HCL 5 MG PO TABS: 5.0000 mg | ORAL_TABLET | Freq: Three times a day (TID) | ORAL | Status: DC | PRN

## 2018-02-10 MED ORDER — ONDANSETRON HCL 4 MG/2ML IJ SOLN
INTRAMUSCULAR | Status: AC
  Filled 2018-02-10: qty 2

## 2018-02-10 MED ORDER — ASPIRIN 81 MG PO CHEW
81.0000 mg | CHEWABLE_TABLET | Freq: Two times a day (BID) | ORAL | Status: DC
  Administered 2018-02-10 – 2018-02-13 (×6): 81 mg via ORAL
  Filled 2018-02-10 (×6): qty 1

## 2018-02-10 MED ORDER — STERILE WATER FOR IRRIGATION IR SOLN
Status: DC | PRN
  Administered 2018-02-10: 2000 mL

## 2018-02-10 MED ORDER — ONDANSETRON HCL 4 MG PO TABS: 4.0000 mg | ORAL_TABLET | Freq: Four times a day (QID) | ORAL | Status: DC | PRN

## 2018-02-10 MED ORDER — PANTOPRAZOLE SODIUM 40 MG PO TBEC
40.0000 mg | DELAYED_RELEASE_TABLET | Freq: Every day | ORAL | Status: DC
  Administered 2018-02-10 – 2018-02-12 (×2): 40 mg via ORAL
  Filled 2018-02-10 (×5): qty 1

## 2018-02-10 MED ORDER — HYDRALAZINE HCL 100 MG PO TABS: 100.0000 mg | ORAL_TABLET | Freq: Three times a day (TID) | ORAL | Status: DC

## 2018-02-10 MED ORDER — CEFAZOLIN SODIUM-DEXTROSE 2-4 GM/100ML-% IV SOLN
2.0000 g | Freq: Four times a day (QID) | INTRAVENOUS | Status: AC
  Administered 2018-02-10 – 2018-02-11 (×2): 2 g via INTRAVENOUS
  Filled 2018-02-10 (×2): qty 100

## 2018-02-10 MED ORDER — GABAPENTIN 100 MG PO CAPS
100.0000 mg | ORAL_CAPSULE | Freq: Three times a day (TID) | ORAL | Status: DC
  Administered 2018-02-10 – 2018-02-13 (×9): 100 mg via ORAL
  Filled 2018-02-10 (×10): qty 1

## 2018-02-10 MED ORDER — GLIPIZIDE 10 MG PO TABS
10.0000 mg | ORAL_TABLET | Freq: Every day | ORAL | Status: DC
  Administered 2018-02-11 – 2018-02-13 (×3): 10 mg via ORAL
  Filled 2018-02-10 (×3): qty 1

## 2018-02-10 MED ORDER — ACETAMINOPHEN 325 MG PO TABS
325.0000 mg | ORAL_TABLET | Freq: Four times a day (QID) | ORAL | Status: DC | PRN
  Administered 2018-02-11 (×2): 650 mg via ORAL
  Filled 2018-02-10 (×2): qty 2

## 2018-02-10 MED ORDER — METHOCARBAMOL 1000 MG/10ML IJ SOLN
500.0000 mg | Freq: Four times a day (QID) | INTRAVENOUS | Status: DC | PRN
  Administered 2018-02-10: 500 mg via INTRAVENOUS
  Filled 2018-02-10: qty 550

## 2018-02-10 MED ORDER — OXYCODONE HCL 5 MG/5ML PO SOLN
5.0000 mg | Freq: Once | ORAL | Status: DC | PRN
  Filled 2018-02-10: qty 5

## 2018-02-10 MED ORDER — PROPOFOL 10 MG/ML IV BOLUS
INTRAVENOUS | Status: DC | PRN
  Administered 2018-02-10: 30 mg via INTRAVENOUS

## 2018-02-10 MED ORDER — FENTANYL CITRATE (PF) 100 MCG/2ML IJ SOLN
INTRAMUSCULAR | Status: AC
  Filled 2018-02-10: qty 2

## 2018-02-10 MED ORDER — OXYCODONE HCL 5 MG PO TABS: 5.0000 mg | ORAL_TABLET | Freq: Once | ORAL | Status: DC | PRN

## 2018-02-10 MED ORDER — METOPROLOL SUCCINATE ER 25 MG PO TB24
25.0000 mg | ORAL_TABLET | Freq: Every day | ORAL | Status: DC
  Administered 2018-02-11 – 2018-02-13 (×3): 25 mg via ORAL
  Filled 2018-02-10 (×3): qty 1

## 2018-02-10 MED ORDER — ALUM & MAG HYDROXIDE-SIMETH 200-200-20 MG/5ML PO SUSP: 30.0000 mL | ORAL | Status: DC | PRN

## 2018-02-10 MED ORDER — PROMETHAZINE HCL 25 MG/ML IJ SOLN: 6.2500 mg | INTRAMUSCULAR | Status: DC | PRN

## 2018-02-10 MED ORDER — LACTATED RINGERS IV SOLN
INTRAVENOUS | Status: DC | PRN
  Administered 2018-02-10 (×2): via INTRAVENOUS

## 2018-02-10 MED ORDER — MIDAZOLAM HCL 5 MG/5ML IJ SOLN
INTRAMUSCULAR | Status: DC | PRN
  Administered 2018-02-10: 2 mg via INTRAVENOUS

## 2018-02-10 MED ORDER — DIPHENHYDRAMINE HCL 12.5 MG/5ML PO ELIX: 12.5000 mg | ORAL_SOLUTION | ORAL | Status: DC | PRN

## 2018-02-10 MED ORDER — PHENOL 1.4 % MT LIQD: 1.0000 | OROMUCOSAL | Status: DC | PRN

## 2018-02-10 MED ORDER — METOCLOPRAMIDE HCL 5 MG/ML IJ SOLN: 5.0000 mg | Freq: Three times a day (TID) | INTRAMUSCULAR | Status: DC | PRN

## 2018-02-10 MED ORDER — SODIUM CHLORIDE 0.9 % IV SOLN
INTRAVENOUS | Status: DC
  Administered 2018-02-10 – 2018-02-11 (×2): via INTRAVENOUS

## 2018-02-10 MED ORDER — PROPOFOL 10 MG/ML IV BOLUS
INTRAVENOUS | Status: AC
  Filled 2018-02-10: qty 60

## 2018-02-10 MED ORDER — METHOCARBAMOL 500 MG PO TABS
500.0000 mg | ORAL_TABLET | Freq: Four times a day (QID) | ORAL | Status: DC | PRN
  Administered 2018-02-10 – 2018-02-13 (×6): 500 mg via ORAL
  Filled 2018-02-10 (×6): qty 1

## 2018-02-10 MED ORDER — OXYCODONE HCL 5 MG PO TABS
10.0000 mg | ORAL_TABLET | ORAL | Status: DC | PRN
  Administered 2018-02-12: 10 mg via ORAL
  Filled 2018-02-10: qty 3

## 2018-02-10 MED ORDER — PROPOFOL 10 MG/ML IV BOLUS
INTRAVENOUS | Status: AC
  Filled 2018-02-10: qty 40

## 2018-02-10 MED ORDER — 0.9 % SODIUM CHLORIDE (POUR BTL) OPTIME
TOPICAL | Status: DC | PRN
  Administered 2018-02-10: 1000 mL

## 2018-02-10 MED ORDER — DOCUSATE SODIUM 100 MG PO CAPS
100.0000 mg | ORAL_CAPSULE | Freq: Two times a day (BID) | ORAL | Status: DC
  Administered 2018-02-10 – 2018-02-11 (×3): 100 mg via ORAL
  Filled 2018-02-10 (×6): qty 1

## 2018-02-10 MED ORDER — HYDROCHLOROTHIAZIDE 25 MG PO TABS
25.0000 mg | ORAL_TABLET | Freq: Two times a day (BID) | ORAL | Status: DC
  Administered 2018-02-10 – 2018-02-13 (×6): 25 mg via ORAL
  Filled 2018-02-10 (×6): qty 1

## 2018-02-10 MED ORDER — HYDROMORPHONE HCL 1 MG/ML IJ SOLN
0.2500 mg | INTRAMUSCULAR | Status: DC | PRN
  Administered 2018-02-10 (×2): 0.5 mg via INTRAVENOUS

## 2018-02-10 MED ORDER — PROPOFOL 500 MG/50ML IV EMUL
INTRAVENOUS | Status: DC | PRN
  Administered 2018-02-10: 85 ug/kg/min via INTRAVENOUS

## 2018-02-10 MED ORDER — MENTHOL 3 MG MT LOZG: 1.0000 | LOZENGE | OROMUCOSAL | Status: DC | PRN

## 2018-02-10 MED ORDER — POLYETHYLENE GLYCOL 3350 17 G PO PACK: 17.0000 g | PACK | Freq: Every day | ORAL | Status: DC | PRN

## 2018-02-10 MED ORDER — MIDAZOLAM HCL 2 MG/2ML IJ SOLN
INTRAMUSCULAR | Status: AC
Start: 2018-02-10 — End: ?
  Filled 2018-02-10: qty 2

## 2018-02-10 MED ORDER — SODIUM CHLORIDE 0.9 % IR SOLN
Status: DC | PRN
  Administered 2018-02-10: 1000 mL

## 2018-02-10 MED ORDER — ZOLPIDEM TARTRATE 5 MG PO TABS: 5.0000 mg | ORAL_TABLET | Freq: Every evening | ORAL | Status: DC | PRN

## 2018-02-10 SURGICAL SUPPLY — 34 items
BAG ZIPLOCK 12X15 (MISCELLANEOUS) IMPLANT
BENZOIN TINCTURE PRP APPL 2/3 (GAUZE/BANDAGES/DRESSINGS) ×2 IMPLANT
BLADE SAW SGTL 18X1.27X75 (BLADE) ×2 IMPLANT
CAPT HIP TOTAL 2 ×2 IMPLANT
COVER PERINEAL POST (MISCELLANEOUS) ×2 IMPLANT
COVER SURGICAL LIGHT HANDLE (MISCELLANEOUS) ×2 IMPLANT
DRAPE STERI IOBAN 125X83 (DRAPES) ×2 IMPLANT
DRAPE U-SHAPE 47X51 STRL (DRAPES) ×4 IMPLANT
DRESSING AQUACEL AG SP 3.5X10 (GAUZE/BANDAGES/DRESSINGS) ×1 IMPLANT
DRSG AQUACEL AG ADV 3.5X10 (GAUZE/BANDAGES/DRESSINGS) ×2 IMPLANT
DRSG AQUACEL AG SP 3.5X10 (GAUZE/BANDAGES/DRESSINGS) ×2
DURAPREP 26ML APPLICATOR (WOUND CARE) ×2 IMPLANT
ELECT REM PT RETURN 15FT ADLT (MISCELLANEOUS) ×2 IMPLANT
GAUZE XEROFORM 1X8 LF (GAUZE/BANDAGES/DRESSINGS) IMPLANT
GLOVE BIO SURGEON STRL SZ7.5 (GLOVE) ×2 IMPLANT
GLOVE BIOGEL PI IND STRL 8 (GLOVE) ×2 IMPLANT
GLOVE BIOGEL PI INDICATOR 8 (GLOVE) ×2
GLOVE ECLIPSE 8.0 STRL XLNG CF (GLOVE) ×2 IMPLANT
GOWN STRL REUS W/TWL XL LVL3 (GOWN DISPOSABLE) ×4 IMPLANT
HANDPIECE INTERPULSE COAX TIP (DISPOSABLE) ×1
HOLDER FOLEY CATH W/STRAP (MISCELLANEOUS) ×2 IMPLANT
PACK ANTERIOR HIP CUSTOM (KITS) ×2 IMPLANT
SET HNDPC FAN SPRY TIP SCT (DISPOSABLE) ×1 IMPLANT
STAPLER VISISTAT 35W (STAPLE) IMPLANT
STRIP CLOSURE SKIN 1/2X4 (GAUZE/BANDAGES/DRESSINGS) IMPLANT
SUT ETHIBOND NAB CT1 #1 30IN (SUTURE) ×2 IMPLANT
SUT MNCRL AB 4-0 PS2 18 (SUTURE) IMPLANT
SUT VIC AB 0 CT1 36 (SUTURE) ×2 IMPLANT
SUT VIC AB 1 CT1 36 (SUTURE) ×2 IMPLANT
SUT VIC AB 2-0 CT1 27 (SUTURE) ×2
SUT VIC AB 2-0 CT1 TAPERPNT 27 (SUTURE) ×2 IMPLANT
TAPE STRIPS DRAPE STRL (GAUZE/BANDAGES/DRESSINGS) ×2 IMPLANT
TRAY FOLEY MTR SLVR 16FR STAT (SET/KITS/TRAYS/PACK) ×2 IMPLANT
YANKAUER SUCT BULB TIP 10FT TU (MISCELLANEOUS) ×2 IMPLANT

## 2018-02-10 NOTE — Anesthesia Preprocedure Evaluation (Addendum)
Anesthesia Evaluation    Reviewed: Allergy & Precautions, Patient's Chart, lab work & pertinent test results  Airway Mallampati: II  TM Distance: >3 FB Neck ROM: Full    Dental no notable dental hx.    Pulmonary sleep apnea ,    Pulmonary exam normal breath sounds clear to auscultation       Cardiovascular hypertension, Pt. on medications + Peripheral Vascular Disease and +CHF  Normal cardiovascular exam+ dysrhythmias Atrial Fibrillation  Rhythm:Regular Rate:Normal     Neuro/Psych negative neurological ROS  negative psych ROS   GI/Hepatic negative GI ROS, Neg liver ROS,   Endo/Other  diabetes, Type 2, Oral Hypoglycemic Agents  Renal/GU negative Renal ROS     Musculoskeletal  (+) Arthritis , Osteoarthritis,    Abdominal   Peds  Hematology   Anesthesia Other Findings   Reproductive/Obstetrics                            Lab Results  Component Value Date   WBC 10.9 (H) 02/03/2018   HGB 14.2 02/03/2018   HCT 41.7 02/03/2018   MCV 86.7 02/03/2018   PLT 245 02/03/2018   Lab Results  Component Value Date   CREATININE 1.14 02/03/2018   BUN 19 02/03/2018   NA 139 02/03/2018   K 4.1 02/03/2018   CL 104 02/03/2018   CO2 23 02/03/2018   Lab Results  Component Value Date   INR 1.14 08/18/2016     Anesthesia Physical Anesthesia Plan  ASA: III  Anesthesia Plan: Spinal   Post-op Pain Management:    Induction: Intravenous  PONV Risk Score and Plan: 2 and Ondansetron and Propofol infusion  Airway Management Planned: Simple Face Mask  Additional Equipment: None  Intra-op Plan:   Post-operative Plan:   Informed Consent:   Plan Discussed with:   Anesthesia Plan Comments:         Anesthesia Quick Evaluation

## 2018-02-10 NOTE — Brief Op Note (Signed)
02/10/2018  1:38 PM  PATIENT:  Raymond Avery  62 y.o. male  PRE-OPERATIVE DIAGNOSIS:  osteoarthritis left hip  POST-OPERATIVE DIAGNOSIS:  osteoarthritis left hip  PROCEDURE:  Procedure(s): LEFT TOTAL HIP ARTHROPLASTY ANTERIOR APPROACH (Left)  SURGEON:  Surgeon(s) and Role:    Kathryne Hitch, MD - Primary  PHYSICIAN ASSISTANT: Rexene Edison, PA-C  ANESTHESIA:   spinal  EBL:  250 mL   COUNTS:  YES  DICTATION: .Other Dictation: Dictation Number [id number]  PLAN OF CARE: Admit to inpatient   PATIENT DISPOSITION:  PACU - hemodynamically stable.   Delay start of Pharmacological VTE agent (>24hrs) due to surgical blood loss or risk of bleeding: no

## 2018-02-10 NOTE — Anesthesia Postprocedure Evaluation (Signed)
Anesthesia Post Note  Patient: Raymond Avery  Procedure(s) Performed: LEFT TOTAL HIP ARTHROPLASTY ANTERIOR APPROACH (Left Hip)     Patient location during evaluation: PACU Anesthesia Type: Spinal Level of consciousness: oriented and awake and alert Pain management: pain level controlled Vital Signs Assessment: post-procedure vital signs reviewed and stable Respiratory status: spontaneous breathing and respiratory function stable Cardiovascular status: blood pressure returned to baseline and stable Postop Assessment: no headache, no backache and no apparent nausea or vomiting Anesthetic complications: no    Last Vitals:  Vitals:   02/10/18 1500 02/10/18 1515  BP: (!) 145/77 (!) 143/81  Pulse: (!) 51 (!) 51  Resp: 19 18  Temp:    SpO2: 100% 98%    Last Pain:  Vitals:   02/10/18 1530  TempSrc:   PainSc: 6     LLE Motor Response: Purposeful movement (02/10/18 1530) LLE Sensation: Decreased;No pain;No tingling (02/10/18 1530) RLE Motor Response: Purposeful movement (02/10/18 1530) RLE Sensation: Decreased;No pain;No tingling (02/10/18 1530) L Sensory Level: L2-Upper inner thigh, upper buttock (02/10/18 1530) R Sensory Level: L2-Upper inner thigh, upper buttock (02/10/18 1530)  Lowella Curb

## 2018-02-10 NOTE — Plan of Care (Signed)
Reviewed plan of care, specifically pain control measures, safety precautions, IS use, and importance of notifying RN with any questions or concerns. Pt attentive and verbalized understanding of all education.  

## 2018-02-10 NOTE — Op Note (Signed)
NAME: NABIL, BUBOLZ MEDICAL RECORD ZO:[id number] ACCOUNT [id number] DATE OF BIRTH:[birth year] FACILITY: [facility] LOCATION: [facility] PHYSICIAN:Dow Blahnik Aretha Parrot, MD  OPERATIVE REPORT  DATE OF PROCEDURE:  02/10/2018  PREOPERATIVE DIAGNOSIS:  Severe primary osteoarthritis and degenerative joint disease, left hip.  POSTOPERATIVE DIAGNOSIS:  Severe primary osteoarthritis and degenerative joint disease, left hip.  PROCEDURE:  Left total hip arthroplasty, direct anterior approach.  IMPLANTS:  DePuy Sector Gription acetabular component size 62, size 36+0 polyethylene liner, size 16 Corail femoral component with standard offset, size 36+5 ceramic hip ball.  SURGEON:  Vanita Panda. Magnus Ivan, MD  ASSISTANT:  Rexene Edison, PA-C   ANESTHESIA:  Spinal nerve.  ANTIBIOTICS:  3 g IV Ancef.  ESTIMATED BLOOD LOSS:  300 mL.  COMPLICATIONS:  None.  INDICATIONS:  The patient is a 62 year old gentleman with severe osteoarthritis and degenerative joint disease of his left hip.  His mobility is significantly limited from his hip, but his pain is mainly radiating to his knee.  We worked up his knee, and  he only has mild arthritic changes and a minimal meniscal tear.  I showed him the x-rays of the hip, and we cannot rotate his hip around at all.  I do feel that all of the pain in his knee is associated with the hip, and we have recommended hip  replacement surgery to him.  After thoroughly thinking about this and discussing the risks and benefits of this and going over it with him in detail, he did wish to proceed with the hip surgery.  DESCRIPTION OF PROCEDURE:  After informed consent was obtained, the left hip was marked.  He was brought to the operating room where spinal anesthesia was obtained while he was on a stretcher.  A Foley catheter was placed, and both feet had traction  boots applied to them.  Next, he was placed supine on the Hana fracture table with a perineal post in place and both legs in  in-line skeletal traction.  Of note, his left leg was short preoperatively compared to the right side.  Once he was placed supine  on the operating table, his left hip was prepped and draped with DuraPrep and sterile drape.  Timeout was called, and he was identified as the correct patient and correct left hip.  We then made an incision just inferior and posterior to the anterior  superior iliac spine and carried this obliquely down the leg.  We dissected down the tensor fascia lata muscle.  The tensor fascia was then divided longitudinally to proceed with an anterior approach to the hip.  We identified and cauterized the  circumflex vessels.  I then identified the hip capsule.  The hip capsule was opened in an L-type format finding a large joint effusion and significant disease throughout his left hip.  We had to make our femoral neck cut just above the calcar at the  lesser trochanter based on his previous hip disease.  We placed a corkscrew guide in the femoral head and removed the femoral head in its entirety and found it to be collapsed and completely devoid of cartilage.  We then placed a bent Hohmann over the  medial acetabular rim and cleaned the remnants of the acetabular labrum and other debris.  We then began reaming in step-wise increments starting small, going all the way up to a 61 with all reamers under direct visualization, the last one under direct  fluoroscopy so we could obtain our depth of reaming, our inclination and anteversion.  We then  placed the real DePuy Sector Gription acetabular component size 62 and a 36+0 polyethylene liner for that size acetabular component.  Attention was then turned  to the femur where the leg was externally rotated at 120 degrees, extended and adducted.  We placed a Mueller retractor medially and a Hohmann retractor behind the greater trochanter.  We released the joint capsule and used a box-cutting osteotome in  the femoral canal and a rongeur to lateralize  it.  We then began broaching from a size 8 broach, going all the way up to size 16.  With a 16 in place, we trialed a standard offset femoral neck, and with a lower neck cut, we went with a 36+5 hip ball and  reduced this in the acetabulum.  We were pleased with the leg length, offset, range of motion and stability.  We then dislocated the hip and removed the trial components.  We placed the real Corail size 16 femoral component with standard offset and the  real 36+5 ceramic hip ball and reduced this into the acetabulum, and again we felt it was stable.  We then irrigated the soft tissues with normal saline solution using pulsatile lavage.  We closed the joint capsule with interrupted #1 Ethibond suture,  followed by running #1 Vicryl in tensor fascia with 0 Vicryl in the deep tissue, 2-0 Vicryl subcutaneous tissue, 4-0 Monocryl subcuticular stitch and Steri-Strips on the skin.  An Aquacel dressing was applied.  He was taken off the Hana table and taken  to recovery room in stable condition.  All final counts were correct.  There were no complications noted.  Of note, Edward Jolly, PA-C, assisted the entire case.  His assistance was crucial for facilitating all aspects of this case.  LN/NUANCE  D:02/10/2018 T:02/10/2018 JOB:[id number]

## 2018-02-10 NOTE — Anesthesia Procedure Notes (Addendum)
Procedure Name: MAC Date/Time: 02/10/2018 11:44 AM Performed by: Lissa Morales, CRNA Pre-anesthesia Checklist: Patient identified, Emergency Drugs available, Suction available, Patient being monitored and Timeout performed Patient Re-evaluated:Patient Re-evaluated prior to induction Oxygen Delivery Method: Simple face mask Placement Confirmation: positive ETCO2

## 2018-02-10 NOTE — Transfer of Care (Signed)
Immediate Anesthesia Transfer of Care Note  Patient: Raymond Avery  Procedure(s) Performed: LEFT TOTAL HIP ARTHROPLASTY ANTERIOR APPROACH (Left Hip)  Patient Location: PACU  Anesthesia Type:Spinal  Level of Consciousness: sedated  Airway & Oxygen Therapy: Patient Spontanous Breathing and Patient connected to face mask oxygen  Post-op Assessment: Report given to RN and Post -op Vital signs reviewed and stable  Post vital signs: Reviewed and stable  Last Vitals:  Vitals Value Taken Time  BP    Temp    Pulse    Resp    SpO2      Last Pain:  Vitals:   02/10/18 1007  TempSrc: Oral         Complications: No apparent anesthesia complications

## 2018-02-10 NOTE — H&P (Signed)
TOTAL HIP ADMISSION H&P  Patient is admitted for left total hip arthroplasty.  Subjective:  Chief Complaint: left hip pain  HPI: Raymond Avery, 62 y.o. male, has a history of pain and functional disability in the left hip(s) due to arthritis and patient has failed non-surgical conservative treatments for greater than 12 weeks to include NSAID's and/or analgesics and activity modification.  Onset of symptoms was abrupt starting 1 years ago with stable course since that time.The patient noted no past surgery on the left hip(s).  Patient currently rates pain in the left hip at 1 out of 10 with activity. Patient has worsening of pain with activity and weight bearing, trendelenberg gait and pain that interfers with activities of daily living. Patient has evidence of subchondral cysts, subchondral sclerosis, periarticular osteophytes and joint space narrowing by imaging studies. This condition presents safety issues increasing the risk of falls.  There is no current active infection.  Patient Active Problem List   Diagnosis Date Noted  . Unilateral primary osteoarthritis, left hip 01/30/2018  . Arrhythmia 01/12/2018  . 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 Obesity, unspecified 01/12/2018  . Varicose veins 01/12/2018  . Lymphedema 11/24/2017  . Chronic venous insufficiency 11/24/2017  . Medical non-compliance 09/29/2016  . Cellulitis of left foot 06/09/2015  . Erectile dysfunction associated with type 2 diabetes mellitus (HCC) 08/19/2014  . Arthrosis of knee 05/24/2014  . ED (erectile dysfunction) 04/16/2014  . Microalbuminuria 12/03/2013  . Essential (primary) hypertension 12/01/2013  . Diabetes mellitus, type II (HCC) 11/16/2013  . Diabetes mellitus without complication (HCC) 01/04/2013  . Essential hypertension 01/04/2013  . SIRS (systemic inflammatory response syndrome) (HCC) 01/04/2013  . Bacteremia 01/04/2013   Past Medical History:  Diagnosis Date  . A-fib (HCC)   . Arrhythmia   . Arthritis    . CHF (congestive heart failure) (HCC)   . Diabetes (HCC)    type 2   . Hypertension   . Hyponatremia   . Leukocytosis   . Sleep apnea    dx. 10 years ago never wore mask  . Systemic inflammatory response syndrome (HCC)   . UTI (urinary tract infection)     Past Surgical History:  Procedure Laterality Date  . AMPUTATION TOE Left 06/10/2015   Procedure: Left great toe amputation ;  Surgeon: Linus Galas, MD;  Location: [facility] ORS;  Service: Podiatry;  Laterality: Left;and right  . IRRIGATION AND DEBRIDEMENT FOOT Right 08/18/2016   Procedure: IRRIGATION AND DEBRIDEMENT FOOT ABSCESS AND BONE;  Surgeon: Linus Galas, DPM;  Location: [facility] ORS;  Service: Podiatry;  Laterality: Right;  . JOINT REPLACEMENT     Left total hip  Dr. Magnus Ivan    02-10-18   . PENILE PROSTHESIS IMPLANT    . TONSILLECTOMY      Current Facility-Administered Medications  Medication Dose Route Frequency Provider Last Rate Last Dose  . ceFAZolin (ANCEF) 3 g in dextrose 5 % 50 mL IVPB  3 g Intravenous On Call to OR Kathryne Hitch, MD      . tranexamic acid (CYKLOKAPRON) 1,000 mg in sodium chloride 0.9 % 100 mL IVPB  1,000 mg Intravenous On Call to OR Kathryne Hitch, MD       No Known Allergies  Social History   Tobacco Use  . Smoking status: Never Smoker  . Smokeless tobacco: Never Used  Substance Use Topics  . Alcohol use: Yes    Alcohol/week: 8.4 oz    Types: 14 Cans of beer per week    Comment:  beer  daily    Family History  Problem Relation Age of Onset  . Heart attack Mother   . Cancer Father      Review of Systems  Musculoskeletal: Positive for joint pain.  All other systems reviewed and are negative.   Objective:  Physical Exam  Constitutional: He is oriented to person, place, and time. He appears well-developed and well-nourished.  HENT:  Head: Normocephalic and atraumatic.  Eyes: Pupils are equal, round, and reactive to light. EOM are normal.  Neck: Normal range of motion.  Neck supple.  Cardiovascular: Normal rate and regular rhythm.  Respiratory: Effort normal and breath sounds normal.  GI: Soft.  Musculoskeletal:       Left hip: He exhibits decreased range of motion, decreased strength, tenderness and bony tenderness.  Neurological: He is alert and oriented to person, place, and time.  Skin: Skin is warm and dry.  Psychiatric: He has a normal mood and affect.    Vital signs in last 24 hours: Temp:  [98.1 F (36.7 C)] 98.1 F (36.7 C) (05/24 1007) Pulse Rate:  [67] 67 (05/24 1007) Resp:  [18] 18 (05/24 1007) SpO2:  [98 %] 98 % (05/24 1007) Weight:  [290 lb (131.5 kg)] 290 lb (131.5 kg) (05/24 1007)  Labs:   Estimated body mass index is 35.77 kg/m as calculated from the following:   Height as of this encounter: 6' 3.5" (1.918 m).   Weight as of this encounter: 290 lb (131.5 kg).   Imaging Review Plain radiographs demonstrate severe degenerative joint disease of the left hip(s). The bone quality appears to be good for age and reported activity level.    Preoperative templating of the joint replacement has been completed, documented, and submitted to the Operating Room personnel in order to optimize intra-operative equipment management.     Assessment/Plan:  End stage arthritis, left hip(s)  The patient history, physical examination, clinical judgement of the provider and imaging studies are consistent with end stage degenerative joint disease of the left hip(s) and total hip arthroplasty is deemed medically necessary. The treatment options including medical management, injection therapy, arthroscopy and arthroplasty were discussed at length. The risks and benefits of total hip arthroplasty were presented and reviewed. The risks due to aseptic loosening, infection, stiffness, dislocation/subluxation,  thromboembolic complications and other imponderables were discussed.  The patient acknowledged the explanation, agreed to proceed with the plan  and consent was signed. Patient is being admitted for inpatient treatment for surgery, pain control, PT, OT, prophylactic antibiotics, VTE prophylaxis, progressive ambulation and ADL's and discharge planning.The patient is planning to be discharged home with home health services

## 2018-02-11 LAB — CBC
HEMATOCRIT: 34.8 % — AB (ref 39.0–52.0)
Hemoglobin: 11.5 g/dL — ABNORMAL LOW (ref 13.0–17.0)
MCH: 29 pg (ref 26.0–34.0)
MCHC: 33 g/dL (ref 30.0–36.0)
MCV: 87.9 fL (ref 78.0–100.0)
PLATELETS: 236 10*3/uL (ref 150–400)
RBC: 3.96 MIL/uL — ABNORMAL LOW (ref 4.22–5.81)
RDW: 13.3 % (ref 11.5–15.5)
WBC: 10 10*3/uL (ref 4.0–10.5)

## 2018-02-11 LAB — BASIC METABOLIC PANEL
Anion gap: 8 (ref 5–15)
BUN: 18 mg/dL (ref 6–20)
CALCIUM: 8.2 mg/dL — AB (ref 8.9–10.3)
CO2: 23 mmol/L (ref 22–32)
Chloride: 101 mmol/L (ref 101–111)
Creatinine, Ser: 1.25 mg/dL — ABNORMAL HIGH (ref 0.61–1.24)
GFR calc Af Amer: >60 mL/min (ref >60)
GFR calc non Af Amer: 60 mL/min — ABNORMAL LOW (ref >60)
GLUCOSE: 196 mg/dL — AB (ref 65–99)
Potassium: 4.2 mmol/L (ref 3.5–5.1)
Sodium: 132 mmol/L — ABNORMAL LOW (ref 135–145)

## 2018-02-11 NOTE — Progress Notes (Signed)
Subjective: 1 Day Post-Op Procedure(s) (LRB): LEFT TOTAL HIP ARTHROPLASTY ANTERIOR APPROACH (Left) Patient reports pain as mild.    Objective: Vital signs in last 24 hours: Temp:  [97.2 F (36.2 C)-101.3 F (38.5 C)] 101.3 F (38.5 C) (05/25 0933) Pulse Rate:  [50-83] 83 (05/25 0933) Resp:  [14-20] 17 (05/25 0933) BP: (107-161)/(65-90) 158/66 (05/25 0933) SpO2:  [92 %-100 %] 92 % (05/25 0933) Weight:  [290 lb (131.5 kg)] 290 lb (131.5 kg) (05/24 1007)  Intake/Output from previous day: 05/24 0701 - 05/25 0700 In: 4083.8 [P.O.:780; I.V.:3048.8; IV Piggyback:255] Out: 1800 [Urine:1550; Blood:250] Intake/Output this shift: Total I/O In: 120 [P.O.:120] Out: 50 [Urine:50]  Recent Labs    02/11/18 0451  HGB 11.5*   Recent Labs    02/11/18 0451  WBC 10.0  RBC 3.96*  HCT 34.8*  PLT 236   Recent Labs    02/11/18 0451  NA 132*  K 4.2  CL 101  CO2 23  BUN 18  CREATININE 1.25*  GLUCOSE 196*  CALCIUM 8.2*   No results for input(s): LABPT, INR in the last 72 hours.  Left lower leg: Intact pulses distally Dorsiflexion/Plantar flexion intact Incision: dressing C/D/I Compartment soft   Assessment/Plan: 1 Day Post-Op Procedure(s) (LRB): LEFT TOTAL HIP ARTHROPLASTY ANTERIOR APPROACH (Left) Up with therapy Discharge home with home health on Monday     Vickye Astorino 02/11/2018, 9:45 AM

## 2018-02-11 NOTE — Evaluation (Signed)
Physical Therapy Evaluation Patient Details Name: Raymond Avery MRN: [id number] DOB: December 29, 1955 Today's Date: 02/11/2018   History of Present Illness  Pt s/p L THR adn with hx of A-fib, CHF, and DM with bilat numbness in feet and s/p amputation of bil great toes  Clinical Impression  Pt s/p L THR and presents with decreased L LE strength/ROM, post op pain, LE peripheral neuropathy, balance deficits and obesity limiting functional mobility.  Pt hopes to dc home but reports only intermittent assist of son.    Follow Up Recommendations Home health PT;Follow surgeon's recommendation for DC plan and follow-up therapies    Equipment Recommendations  Rolling walker with 5" wheels    Recommendations for Other Services OT consult     Precautions / Restrictions Precautions Precautions: Fall Restrictions Weight Bearing Restrictions: No      Mobility  Bed Mobility Overal bed mobility: Needs Assistance Bed Mobility: Supine to Sit     Supine to sit: Mod assist     General bed mobility comments: Increased time with cues for sequence and use of R LE to self assist  Transfers Overall transfer level: Needs assistance Equipment used: Rolling walker (2 wheeled) Transfers: Sit to/from Stand Sit to Stand: Min assist;From elevated surface         General transfer comment: cues for LE management and use of UEs to self assist  Ambulation/Gait Ambulation/Gait assistance: Min assist Ambulation Distance (Feet): 38 Feet Assistive device: Rolling walker (2 wheeled) Gait Pattern/deviations: Step-to pattern;Decreased step length - right;Decreased step length - left;Shuffle;Trunk flexed;Steppage Gait velocity: decr   General Gait Details: cues for posture, sequence and position from RW; Noted increased difficulty advancing bil LEs 2 foot drop  Stairs            Wheelchair Mobility    Modified Rankin (Stroke Patients Only)       Balance Overall balance assessment: Needs  assistance Sitting-balance support: No upper extremity supported;Feet supported Sitting balance-Leahy Scale: Good     Standing balance support: Bilateral upper extremity supported Standing balance-Leahy Scale: Poor                               Pertinent Vitals/Pain Pain Assessment: 0-10 Pain Score: 5  Pain Location: L hip Pain Descriptors / Indicators: Aching;Sore Pain Intervention(s): Limited activity within patient's tolerance;Monitored during session;Premedicated before session;Ice applied    Home Living Family/patient expects to be discharged to:: Private residence Living Arrangements: Alone Available Help at Discharge: Family;Available PRN/intermittently Type of Home: Mobile home Home Access: Stairs to enter Entrance Stairs-Rails: Right;Left Entrance Stairs-Number of Steps: 6 Home Layout: One level Home Equipment: Cane - single point      Prior Function Level of Independence: Independent;Independent with assistive device(s)         Comments: uses cane more and more     Hand Dominance        Extremity/Trunk Assessment   Upper Extremity Assessment Upper Extremity Assessment: Overall WFL for tasks assessed    Lower Extremity Assessment Lower Extremity Assessment: RLE deficits/detail;LLE deficits/detail RLE Deficits / Details: ltd DF with decreased sensation in foot RLE Sensation: history of peripheral neuropathy LLE Deficits / Details: AAROM at hip to 75 flex and 15 abd; very ltd dorsiflexion with numbness in foot LLE Sensation: history of peripheral neuropathy       Communication   Communication: No difficulties  Cognition Arousal/Alertness: Awake/alert Behavior During Therapy: Impulsive Overall Cognitive Status: Within Functional Limits for  tasks assessed                                        General Comments      Exercises Total Joint Exercises Ankle Circles/Pumps: AROM;AAROM;Both;15 reps;Supine Quad Sets:  AROM;Both;10 reps;Supine Heel Slides: AAROM;Left;15 reps;Supine Hip ABduction/ADduction: AAROM;Left;15 reps;Supine   Assessment/Plan    PT Assessment Patient needs continued PT services  PT Problem List Decreased strength;Decreased range of motion;Decreased activity tolerance;Decreased balance;Decreased mobility;Decreased knowledge of use of DME;Obesity;Pain       PT Treatment Interventions DME instruction;Gait training;Stair training;Functional mobility training;Therapeutic activities;Therapeutic exercise;Patient/family education    PT Goals (Current goals can be found in the Care Plan section)  Acute Rehab PT Goals Patient Stated Goal: Regain IND and return home PT Goal Formulation: With patient Time For Goal Achievement: 02/18/18 Potential to Achieve Goals: Good    Frequency 7X/week   Barriers to discharge        Co-evaluation               AM-PAC PT "6 Clicks" Daily Activity  Outcome Measure Difficulty turning over in bed (including adjusting bedclothes, sheets and blankets)?: Unable Difficulty moving from lying on back to sitting on the side of the bed? : Unable Difficulty sitting down on and standing up from a chair with arms (e.g., wheelchair, bedside commode, etc,.)?: Unable Help needed moving to and from a bed to chair (including a wheelchair)?: A Little Help needed walking in hospital room?: A Little Help needed climbing 3-5 steps with a railing? : A Lot 6 Click Score: 11    End of Session Equipment Utilized During Treatment: Gait belt Activity Tolerance: Patient tolerated treatment well Patient left: in chair;with call bell/phone within reach;with family/visitor present Nurse Communication: Mobility status PT Visit Diagnosis: Unsteadiness on feet (R26.81);Difficulty in walking, not elsewhere classified (R26.2)    Time: 4098-1191 PT Time Calculation (min) (ACUTE ONLY): 41 min   Charges:   PT Evaluation $PT Eval Low Complexity: 1 Low PT  Treatments $Gait Training: 8-22 mins $Therapeutic Exercise: 8-22 mins   PT G Codes:        Pg [phone]   Aima Mcwhirt 02/11/2018, 12:41 PM

## 2018-02-11 NOTE — Progress Notes (Signed)
Physical Therapy Treatment Patient Details Name: Raymond Avery MRN: [id number] DOB: Jun 06, 1956 Today's Date: 02/11/2018    History of Present Illness Pt s/p L THR and with hx of A-fib, CHF, and DM with bilat numbness in feet and s/p amputation of bil great toes    PT Comments    Marked improvement in activity tolerance noted but with continued instability on ambulation  - pt with bilat LE peripheral neuropathy.   Follow Up Recommendations  Home health PT;Follow surgeon's recommendation for DC plan and follow-up therapies     Equipment Recommendations  Rolling walker with 5" wheels    Recommendations for Other Services OT consult     Precautions / Restrictions Precautions Precautions: Fall Restrictions Weight Bearing Restrictions: No    Mobility  Bed Mobility Overal bed mobility: Needs Assistance Bed Mobility: Supine to Sit     Supine to sit: Mod assist     General bed mobility comments: NT - up in chair and requests back to same   Transfers Overall transfer level: Needs assistance Equipment used: Rolling walker (2 wheeled) Transfers: Sit to/from Stand Sit to Stand: Min guard         General transfer comment: cues for LE management and use of UEs to self assist  Ambulation/Gait Ambulation/Gait assistance: Min assist;Min guard Ambulation Distance (Feet): 180 Feet Assistive device: Rolling walker (2 wheeled) Gait Pattern/deviations: Step-to pattern;Decreased step length - right;Decreased step length - left;Shuffle;Trunk flexed;Steppage Gait velocity: decr   General Gait Details: cues for posture, sequence and position from RW; Noted increased difficulty advancing bil LEs 2 foot drop   Stairs             Wheelchair Mobility    Modified Rankin (Stroke Patients Only)       Balance Overall balance assessment: Needs assistance Sitting-balance support: No upper extremity supported;Feet supported Sitting balance-Leahy Scale: Good     Standing  balance support: Bilateral upper extremity supported Standing balance-Leahy Scale: Poor                              Cognition Arousal/Alertness: Awake/alert Behavior During Therapy: Impulsive Overall Cognitive Status: Within Functional Limits for tasks assessed                                        Exercises Total Joint Exercises Ankle Circles/Pumps: AROM;AAROM;Both;15 reps;Supine Quad Sets: AROM;Both;10 reps;Supine Heel Slides: AAROM;Left;15 reps;Supine Hip ABduction/ADduction: AAROM;Left;15 reps;Supine    General Comments        Pertinent Vitals/Pain Pain Assessment: 0-10 Pain Score: 4  Pain Location: L hip Pain Descriptors / Indicators: Aching;Sore Pain Intervention(s): Limited activity within patient's tolerance;Monitored during session;Premedicated before session;Ice applied    Home Living Family/patient expects to be discharged to:: Private residence Living Arrangements: Alone Available Help at Discharge: Family;Available PRN/intermittently Type of Home: Mobile home Home Access: Stairs to enter Entrance Stairs-Rails: Right;Left Home Layout: One level Home Equipment: Cane - single point      Prior Function Level of Independence: Independent;Independent with assistive device(s)      Comments: uses cane more and more   PT Goals (current goals can now be found in the care plan section) Acute Rehab PT Goals Patient Stated Goal: Regain IND and return home PT Goal Formulation: With patient Time For Goal Achievement: 02/18/18 Potential to Achieve Goals: Good Progress towards PT goals: Progressing toward  goals    Frequency    7X/week      PT Plan Current plan remains appropriate    Co-evaluation              AM-PAC PT "6 Clicks" Daily Activity  Outcome Measure  Difficulty turning over in bed (including adjusting bedclothes, sheets and blankets)?: Unable Difficulty moving from lying on back to sitting on the side of  the bed? : Unable Difficulty sitting down on and standing up from a chair with arms (e.g., wheelchair, bedside commode, etc,.)?: A Lot Help needed moving to and from a bed to chair (including a wheelchair)?: A Little Help needed walking in hospital room?: A Little Help needed climbing 3-5 steps with a railing? : A Lot 6 Click Score: 12    End of Session Equipment Utilized During Treatment: Gait belt Activity Tolerance: Patient tolerated treatment well Patient left: in chair;with call bell/phone within reach;with family/visitor present Nurse Communication: Mobility status PT Visit Diagnosis: Unsteadiness on feet (R26.81);Difficulty in walking, not elsewhere classified (R26.2)     Time: 2952-8413 PT Time Calculation (min) (ACUTE ONLY): 15 min  Charges:  $Gait Training: 8-22 mins $Therapeutic Exercise: 8-22 mins                    G Codes:       Pg [phone]    Hoke Baer 02/11/2018, 3:49 PM

## 2018-02-11 NOTE — Evaluation (Signed)
Occupational Therapy Evaluation Patient Details Name: Raymond Avery MRN: [id number] DOB: 07-12-1956 Today's Date: 02/11/2018    History of Present Illness Pt s/p L THR and with hx of A-fib, CHF, and DM with bilat numbness in feet and s/p amputation of bil great toes   Clinical Impression   Pt is s/p THA resulting in the deficits listed below (see OT Problem List).  Pt will benefit from skilled OT to increase their safety and independence with ADL and functional mobility for ADL to facilitate discharge to venue listed below.        Follow Up Recommendations  No OT follow up    Equipment Recommendations  3 in 1 bedside commode    Recommendations for Other Services       Precautions / Restrictions Precautions Precautions: Fall Restrictions Weight Bearing Restrictions: No      Mobility Bed Mobility   Bed Mobility: Supine to Sit;Sit to Supine     Supine to sit: Min assist Sit to supine: Mod assist   General bed mobility comments: VC for sequencing  Transfers Overall transfer level: Needs assistance Equipment used: Rolling walker (2 wheeled) Transfers: Sit to/from Stand Sit to Stand: Min assist;From elevated surface         General transfer comment: cues for LE management and use of UEs to self assist    Balance Overall balance assessment: Needs assistance Sitting-balance support: No upper extremity supported;Feet supported Sitting balance-Leahy Scale: Good     Standing balance support: Bilateral upper extremity supported Standing balance-Leahy Scale: Poor                             ADL either performed or assessed with clinical judgement   ADL Overall ADL's : Needs assistance/impaired Eating/Feeding: Set up   Grooming: Set up;Sitting   Upper Body Bathing: Minimal assistance;Sitting   Lower Body Bathing: Maximal assistance;Sit to/from stand   Upper Body Dressing : Minimal assistance;Sitting   Lower Body Dressing: Maximal assistance;Sit  to/from stand;Cueing for sequencing;Cueing for safety   Toilet Transfer: RW Toilet Transfer Details (indicate cue type and reason): sit to stand for urinal Toileting- Clothing Manipulation and Hygiene: Moderate assistance;Sit to/from stand               Vision Patient Visual Report: No change from baseline       Perception     Praxis      Pertinent Vitals/Pain Pain Assessment: 0-10 Pain Score: 5  Pain Location: L hip Pain Descriptors / Indicators: Aching;Sore Pain Intervention(s): Limited activity within patient's tolerance;Monitored during session;Patient requesting pain meds-RN notified     Hand Dominance     Extremity/Trunk Assessment Upper Extremity Assessment Upper Extremity Assessment: Overall WFL for tasks assessed           Communication Communication Communication: No difficulties   Cognition Arousal/Alertness: Awake/alert Behavior During Therapy: Impulsive Overall Cognitive Status: Within Functional Limits for tasks assessed                                     General Comments       Exercises     Shoulder Instructions      Home Living Family/patient expects to be discharged to:: Private residence Living Arrangements: Alone Available Help at Discharge: Family;Available PRN/intermittently Type of Home: Mobile home Home Access: Stairs to enter Entrance Stairs-Number of Steps: 6 Entrance Stairs-Rails: Right;Left  Home Layout: One level               Home Equipment: Cane - single point          Prior Functioning/Environment Level of Independence: Independent;Independent with assistive device(s)        Comments: uses cane more and more        OT Problem List: Decreased strength;Decreased activity tolerance;Decreased safety awareness;Impaired balance (sitting and/or standing);Decreased knowledge of use of DME or AE      OT Treatment/Interventions: Self-care/ADL training;Patient/family education;DME and/or AE  instruction;Therapeutic activities    OT Goals(Current goals can be found in the care plan section) Acute Rehab OT Goals Patient Stated Goal: Regain IND and return home OT Goal Formulation: With patient Time For Goal Achievement: 02/25/18  OT Frequency: Min 2X/week    AM-PAC PT "6 Clicks" Daily Activity     Outcome Measure Help from another person eating meals?: None Help from another person taking care of personal grooming?: A Little Help from another person toileting, which includes using toliet, bedpan, or urinal?: A Lot Help from another person bathing (including washing, rinsing, drying)?: A Lot Help from another person to put on and taking off regular upper body clothing?: A Little Help from another person to put on and taking off regular lower body clothing?: A Lot 6 Click Score: 16   End of Session Equipment Utilized During Treatment: Rolling walker Nurse Communication: Mobility status  Activity Tolerance: Patient tolerated treatment well Patient left: in bed;with call bell/phone within reach  OT Visit Diagnosis: Unsteadiness on feet (R26.81);Muscle weakness (generalized) (M62.81)                Time: 1610-9604 OT Time Calculation (min): 14 min Charges:  OT General Charges $OT Visit: 1 Visit OT Evaluation $OT Eval Low Complexity: 1 Low G-Codes:     Lise Auer, OT [phone]  Einar Crow D 02/11/2018, 4:21 PM

## 2018-02-12 MED ORDER — ASPIRIN 81 MG PO CHEW: 81.0000 mg | CHEWABLE_TABLET | Freq: Two times a day (BID) | ORAL | 0 refills | Status: DC

## 2018-02-12 MED ORDER — OXYCODONE HCL 5 MG PO TABS: 5.0000 mg | ORAL_TABLET | ORAL | 0 refills | Status: DC | PRN

## 2018-02-12 MED ORDER — METHOCARBAMOL 500 MG PO TABS: 500.0000 mg | ORAL_TABLET | Freq: Four times a day (QID) | ORAL | 1 refills | Status: DC | PRN

## 2018-02-12 NOTE — Progress Notes (Signed)
Occupational Therapy Treatment Patient Details Name: Raymond Avery MRN: [id number] DOB: [birth year] Today's Date: 02/12/2018    History of present illness Pt s/p L THR and with hx of A-fib, CHF, and DM with bilat numbness in feet and s/p amputation of bil great toes   OT comments  Pt impulsive at times and plans to rely on son during his recovery  Follow Up Recommendations  No OT follow up    Equipment Recommendations  3 in 1 bedside commode    Recommendations for Other Services      Precautions / Restrictions Precautions Precautions: Fall       Mobility Bed Mobility   Bed Mobility: Supine to Sit     Supine to sit: Min assist     General bed mobility comments: used leg lifter  Transfers Overall transfer level: Needs assistance Equipment used: Rolling walker (2 wheeled) Transfers: Sit to/from [employer] Sit to Stand: Min assist         General transfer comment: cues for LE management and use of UEs to self assist    Balance Overall balance assessment: Needs assistance Sitting-balance support: No upper extremity supported;Feet supported Sitting balance-Leahy Scale: Good     Standing balance support: Bilateral upper extremity supported Standing balance-Leahy Scale: Poor                             ADL either performed or assessed with clinical judgement   ADL Overall ADL's : Needs assistance/impaired     Grooming: Standing;Minimal assistance           Upper Body Dressing : Set up;Sitting   Lower Body Dressing: Moderate assistance;Sit to/from stand;Cueing for sequencing;With adaptive equipment;Cueing for safety Lower Body Dressing Details (indicate cue type and reason): instructed in use of AE.  Toilet Transfer: RW;Minimal assistance;Stand-pivot   Toileting- Architect and Hygiene: Minimal assistance;Sit to/from stand;Cueing for sequencing;Cueing for safety         General ADL Comments: VC for hand  placement, VC to keep both hands on walker, VC for safety.  Pt is impulsive at times with his transitions     Vision Patient Visual Report: No change from baseline            Cognition Arousal/Alertness: Awake/alert Behavior During Therapy: Impulsive Overall Cognitive Status: Within Functional Limits for tasks assessed                                                     Pertinent Vitals/ Pain       Pain Score: 3  Pain Location: L hip Pain Descriptors / Indicators: Aching;Sore Pain Intervention(s): Repositioned;Monitored during session         Frequency  Min 2X/week        Progress Toward Goals  OT Goals(current goals can now be found in the care plan section)  Progress towards OT goals: Progressing toward goals     Plan Discharge plan remains appropriate    Co-evaluation                 AM-PAC PT "6 Clicks" Daily Activity     Outcome Measure   Help from another person eating meals?: None Help from another person taking care of personal grooming?: A Little Help from another person toileting, which includes using  toliet, bedpan, or urinal?: A Little Help from another person bathing (including washing, rinsing, drying)?: A Little Help from another person to put on and taking off regular upper body clothing?: A Little Help from another person to put on and taking off regular lower body clothing?: A Lot 6 Click Score: 18    End of Session Equipment Utilized During Treatment: Rolling walker  OT Visit Diagnosis: Unsteadiness on feet (R26.81);Muscle weakness (generalized) (M62.81)   Activity Tolerance Patient tolerated treatment well   Patient Left in bed;with call bell/phone within reach   Nurse Communication Mobility status        Time: 7829-5621 OT Time Calculation (min): 16 min  Charges: OT General Charges $OT Visit: 1 Visit OT Treatments $Self Care/Home Management : 8-22 mins  [place],  [place] [phone]   Einar Crow D 02/12/2018, 11:07 AM

## 2018-02-12 NOTE — Progress Notes (Signed)
Physical Therapy Treatment Patient Details Name: Raymond Avery MRN: [id number] DOB: [birth year] Today's Date: 02/12/2018    History of Present Illness Pt s/p L THR and with hx of A-fib, CHF, and DM with bilat numbness in feet and s/p amputation of bil great toes    PT Comments    The patient is progressing well. Cues for safe  negotiation of steps. Plans DC tomorrow. Patient reports son will be assisting PRN but home alone.  Follow Up Recommendations  Home health PT;Follow surgeon's recommendation for DC plan and follow-up therapies(patient declines HHPT)     Equipment Recommendations  Rolling walker with 5" wheels    Recommendations for Other Services       Precautions / Restrictions Precautions Precautions: Fall    Mobility  Bed Mobility Overal bed mobility: Needs Assistance Bed Mobility: Sit to Supine     Supine to sit: Min assist Sit to supine: Supervision   General bed mobility comments: used leg lifter  Transfers Overall transfer level: Needs assistance Equipment used: Rolling walker (2 wheeled) Transfers: Sit to/from Stand Sit to Stand: Supervision         General transfer comment: cues for LE management and use of UEs to self assist  Ambulation/Gait Ambulation/Gait assistance: Min guard Ambulation Distance (Feet): 200 Feet Assistive device: Rolling walker (2 wheeled) Gait Pattern/deviations: Step-to pattern;Decreased step length - right;Decreased step length - left;Shuffle;Trunk flexed;Steppage     General Gait Details: cues for posture, sequence and position from RW; Noted increased difficulty advancing bil LEs 2 foot drop   Stairs Stairs: Yes Stairs assistance: Min assist Stair Management: One rail Left;Forwards;With cane Number of Stairs: 2(x 2 practice) General stair comments: Cues for safety and  correct sequence.   Wheelchair Mobility    Modified Rankin (Stroke Patients Only)       Balance Overall balance assessment: Needs  assistance Sitting-balance support: No upper extremity supported;Feet supported Sitting balance-Leahy Scale: Good     Standing balance support: Bilateral upper extremity supported Standing balance-Leahy Scale: Poor                              Cognition Arousal/Alertness: Awake/alert Behavior During Therapy: Impulsive Overall Cognitive Status: Within Functional Limits for tasks assessed                                        Exercises Total Joint Exercises Ankle Circles/Pumps: AROM;AAROM;Both;15 reps;Supine Quad Sets: AROM;Both;10 reps;Supine Short Arc Quad: AROM;Left;10 reps Heel Slides: AROM;Left;10 reps Hip ABduction/ADduction: AAROM;Left;10 reps Long Arc Quad: AROM;Left;10 reps    General Comments        Pertinent Vitals/Pain Pain Score: 1  Pain Location: left thigh Pain Descriptors / Indicators: Sore Pain Intervention(s): Monitored during session;Premedicated before session;Ice applied    Home Living                      Prior Function            PT Goals (current goals can now be found in the care plan section) Progress towards PT goals: Progressing toward goals    Frequency    7X/week      PT Plan Current plan remains appropriate    Co-evaluation              AM-PAC PT "6 Clicks" Daily Activity  Outcome Measure  Difficulty turning over in bed (including adjusting bedclothes, sheets and blankets)?: A Little Difficulty moving from lying on back to sitting on the side of the bed? : A Little Difficulty sitting down on and standing up from a chair with arms (e.g., wheelchair, bedside commode, etc,.)?: A Little Help needed moving to and from a bed to chair (including a wheelchair)?: A Little Help needed walking in hospital room?: A Little Help needed climbing 3-5 steps with a railing? : A Lot 6 Click Score: 17    End of Session   Activity Tolerance: Patient tolerated treatment well Patient left: in  bed;with bed alarm set;with call bell/phone within reach Nurse Communication: Mobility status PT Visit Diagnosis: Unsteadiness on feet (R26.81);Difficulty in walking, not elsewhere classified (R26.2)     Time: 4782-9562 PT Time Calculation (min) (ACUTE ONLY): 25 min  Charges:  $Gait Training: 8-22 mins $Therapeutic Exercise: 8-22 mins                    G CodesBlanchard Kelch PT [phone]   Rada Hay 02/12/2018, 11:41 AM

## 2018-02-12 NOTE — Progress Notes (Signed)
Subjective: 2 Days Post-Op Procedure(s) (LRB): LEFT TOTAL HIP ARTHROPLASTY ANTERIOR APPROACH (Left) Patient reports pain as 3 on 0-10 scale.  No complaints.   Objective: Vital signs in last 24 hours: Temp:  [99 F (37.2 C)-101.3 F (38.5 C)] 99 F (37.2 C) (05/26 0458) Pulse Rate:  [72-100] 94 (05/26 0458) Resp:  [18] 18 (05/26 0458) BP: (140-189)/(69-90) 163/90 (05/26 0458) SpO2:  [93 %-98 %] 96 % (05/26 0458)  Intake/Output from previous day: 05/25 0701 - 05/26 0700 In: 1030.4 [P.O.:665; I.V.:365.4] Out: 650 [Urine:650] Intake/Output this shift: No intake/output data recorded.  Recent Labs    02/11/18 0451  HGB 11.5*   Recent Labs    02/11/18 0451  WBC 10.0  RBC 3.96*  HCT 34.8*  PLT 236   Recent Labs    02/11/18 0451  NA 132*  K 4.2  CL 101  CO2 23  BUN 18  CREATININE 1.25*  GLUCOSE 196*  CALCIUM 8.2*   No results for input(s): LABPT, INR in the last 72 hours.  Left lower extremity: Intact pulses distally Dorsiflexion/Plantar flexion intact Incision: dressing C/D/I Compartment soft    Assessment/Plan: 2 Days Post-Op Procedure(s) (LRB): LEFT TOTAL HIP ARTHROPLASTY ANTERIOR APPROACH (Left) Up with therapy  Plan discharge home tomorrow.     GILBERT CLARK 02/12/2018, 9:50 AM

## 2018-02-12 NOTE — Discharge Instructions (Signed)

## 2018-02-12 NOTE — Care Management Note (Signed)
Case Management Note  Patient Details  Name: Raymond Avery MRN: [id number] Date of Birth: 04/24/1956  Subjective/Objective:    Left THR                Action/Plan: NCM spoke to pt at bedside. States he is receiving charity care through [facility] to cover surgery. Declines HH at this time. States son will assist with care. [facility] delivered RW and 3n1 bedside commode to room.  Expected Discharge Date:                  Expected Discharge Plan:  Home w Home Health Services  In-House Referral:  NA  Discharge planning Services  CM Consult  Post Acute Care Choice:  Home Health Choice offered to:  Patient  DME Arranged:  3-N-1, Walker rolling DME Agency:  [facility]  HH Arranged:  Patient Refused [facility] HH Agency:  NA  Status of Service:  Completed, signed off  If discussed at Long Length of Stay Meetings, dates discussed:    Additional Comments:  Elliot Cousin, RN 02/12/2018, 9:28 AM

## 2018-02-12 NOTE — Progress Notes (Signed)
Physical Therapy Treatment Patient Details Name: Raymond Avery MRN: [id number] DOB: 1956-02-04 Today's Date: 02/12/2018    History of Present Illness Pt s/p L THR and with hx of A-fib, CHF, and DM with bilat numbness in feet and s/p amputation of bil great toes    PT Comments    POD # 2 pm session Assisted OOB to amb in hallway, practiced stairs, assisted to bathroom then back to bed.  Pt progressing well and plancs to D/C to home tomorrow.    Follow Up Recommendations  Home health PT;Follow surgeon's recommendation for DC plan and follow-up therapies     Equipment Recommendations  Rolling walker with 5" wheels    Recommendations for Other Services       Precautions / Restrictions Precautions Precautions: Fall Restrictions Weight Bearing Restrictions: No Other Position/Activity Restrictions: WBAT    Mobility  Bed Mobility Overal bed mobility: Needs Assistance Bed Mobility: Supine to Sit;Sit to Supine     Supine to sit: Min assist Sit to supine: Min assist;Mod assist   General bed mobility comments: used leg lifter  Transfers Overall transfer level: Needs assistance Equipment used: Rolling walker (2 wheeled) Transfers: Sit to/from Stand Sit to Stand: Supervision;Min guard Stand pivot transfers: Min guard;Min assist       General transfer comment: cues for LE management and use of UEs to self assist  Ambulation/Gait Ambulation/Gait assistance: Min guard Ambulation Distance (Feet): 145 Feet Assistive device: Rolling walker (2 wheeled) Gait Pattern/deviations: Step-to pattern;Decreased step length - right;Decreased step length - left;Shuffle;Trunk flexed;Steppage Gait velocity: decr   General Gait Details: cues for posture, sequence and position from RW; Noted increased difficulty advancing bil LEs 2 foot drop   Stairs Stairs: Yes Stairs assistance: Min assist Stair Management: One rail Left;Forwards;With cane Number of Stairs: 2 General stair  comments: Cues for safety and  correct sequence.   Wheelchair Mobility    Modified Rankin (Stroke Patients Only)       Balance                                            Cognition Arousal/Alertness: Awake/alert Behavior During Therapy: WFL for tasks assessed/performed Overall Cognitive Status: Within Functional Limits for tasks assessed                                        Exercises      General Comments        Pertinent Vitals/Pain Pain Assessment: 0-10 Pain Score: 3  Pain Location: left thigh Pain Descriptors / Indicators: Sore;Tender Pain Intervention(s): Monitored during session;Repositioned;Ice applied    Home Living                      Prior Function            PT Goals (current goals can now be found in the care plan section) Progress towards PT goals: Progressing toward goals    Frequency    7X/week      PT Plan Current plan remains appropriate    Co-evaluation              AM-PAC PT "6 Clicks" Daily Activity  Outcome Measure  Difficulty turning over in bed (including adjusting bedclothes, sheets and blankets)?: A Little Difficulty moving from lying  on back to sitting on the side of the bed? : A Little Difficulty sitting down on and standing up from a chair with arms (e.g., wheelchair, bedside commode, etc,.)?: A Little Help needed moving to and from a bed to chair (including a wheelchair)?: A Little Help needed walking in hospital room?: A Little Help needed climbing 3-5 steps with a railing? : A Lot 6 Click Score: 17    End of Session Equipment Utilized During Treatment: Left knee immobilizer Activity Tolerance: Patient tolerated treatment well Patient left: in bed;with bed alarm set;with call bell/phone within reach Nurse Communication: Mobility status PT Visit Diagnosis: Unsteadiness on feet (R26.81);Difficulty in walking, not elsewhere classified (R26.2)     Time: 1410-1435 PT  Time Calculation (min) (ACUTE ONLY): 25 min  Charges:  $Gait Training: 8-22 mins $Therapeutic Exercise: 8-22 mins                    G Codes:       {Juliana Boling  PTA [facility]  Acute  Rehab Pager      [phone]

## 2018-02-13 NOTE — Progress Notes (Signed)
Physical Therapy Treatment Patient Details Name: Raymond Avery MRN: [id number] DOB: 10/23/1955 Today's Date: 02/13/2018    History of Present Illness Pt s/p L THR and with hx of A-fib, CHF, and DM with bilat numbness in feet and s/p amputation of bil great toes    PT Comments    POD # 3  Assisted with amb a greater distance in hallway, practiced 12 steps using ons L rail and one R cane.  Returned to room to perform all THR TE's following handout HEP.  Instructed on proper tech, freq as well as use of ICE. Pt has met goals to D/C to home.   Follow Up Recommendations  Home health PT;Follow surgeon's recommendation for DC plan and follow-up therapies     Equipment Recommendations  Rolling walker with 5" wheels    Recommendations for Other Services       Precautions / Restrictions Precautions Precautions: Fall Restrictions Weight Bearing Restrictions: No Other Position/Activity Restrictions: WBAT    Mobility  Bed Mobility Overal bed mobility: Needs Assistance Bed Mobility: Sit to Supine       Sit to supine: Supervision   General bed mobility comments: used leg lifter with much effort   Transfers Overall transfer level: Needs assistance Equipment used: Rolling walker (2 wheeled) Transfers: Sit to/from Stand Sit to Stand: Supervision         General transfer comment: cues for LE management and use of UEs to self assist  pt impulsive  Ambulation/Gait Ambulation/Gait assistance: Min guard;Supervision Ambulation Distance (Feet): 155 Feet Assistive device: Rolling walker (2 wheeled) Gait Pattern/deviations: Step-to pattern;Decreased step length - right;Decreased step length - left;Shuffle;Trunk flexed;Steppage Gait velocity: decr   General Gait Details: <25% VC's safety with turns and using walker throughout transfer   Stairs Stairs: Yes Stairs assistance: Min guard;Min assist Stair Management: One rail Left;Forwards;With cane Number of Stairs: 12 General  stair comments: one initial VC on safety and sequencing   tolerated well   Wheelchair Mobility    Modified Rankin (Stroke Patients Only)       Balance                                            Cognition Arousal/Alertness: Awake/alert Behavior During Therapy: WFL for tasks assessed/performed Overall Cognitive Status: Within Functional Limits for tasks assessed                                        Exercises   Total Hip Replacement TE's 10 reps ankle pumps 10 reps knee presses 10 reps heel slides 10 reps SAQ's 10 reps ABD Followed by ICE     General Comments        Pertinent Vitals/Pain Pain Assessment: 0-10 Pain Score: 5  Pain Location: left thigh Pain Descriptors / Indicators: Sore;Tender;Operative site guarding Pain Intervention(s): Monitored during session;Premedicated before session;Repositioned;Ice applied    Home Living                      Prior Function            PT Goals (current goals can now be found in the care plan section)      Frequency    7X/week      PT Plan Current plan remains appropriate  Co-evaluation              AM-PAC PT "6 Clicks" Daily Activity  Outcome Measure  Difficulty turning over in bed (including adjusting bedclothes, sheets and blankets)?: A Little Difficulty moving from lying on back to sitting on the side of the bed? : A Little Difficulty sitting down on and standing up from a chair with arms (e.g., wheelchair, bedside commode, etc,.)?: A Little Help needed moving to and from a bed to chair (including a wheelchair)?: A Little Help needed walking in hospital room?: A Little Help needed climbing 3-5 steps with a railing? : A Little 6 Click Score: 18    End of Session Equipment Utilized During Treatment: Gait belt Activity Tolerance: Patient tolerated treatment well Patient left: in bed;with bed alarm set;with call bell/phone within reach Nurse  Communication: (pt ready for D/C to home) PT Visit Diagnosis: Unsteadiness on feet (R26.81);Difficulty in walking, not elsewhere classified (R26.2)     Time: 1000-1025 PT Time Calculation (min) (ACUTE ONLY): 25 min  Charges:  $Gait Training: 8-22 mins $Therapeutic Exercise: 8-22 mins                    G Codes:       {Sherilee Smotherman  PTA [facility]  Acute  Rehab Pager      [phone]

## 2018-02-13 NOTE — Progress Notes (Signed)
Subjective: 3 Days Post-Op Procedure(s) (LRB): LEFT TOTAL HIP ARTHROPLASTY ANTERIOR APPROACH (Left) Patient reports pain as mild.  No complaints.   Objective: Vital signs in last 24 hours: Temp:  [99.3 F (37.4 C)-99.9 F (37.7 C)] 99.3 F (37.4 C) (05/27 0517) Pulse Rate:  [92-101] 92 (05/27 0517) Resp:  [16-20] 20 (05/27 0517) BP: (153-181)/(69-85) 154/85 (05/27 0517) SpO2:  [91 %-96 %] 94 % (05/27 0517)  Intake/Output from previous day: 05/26 0701 - 05/27 0700 In: 1560 [P.O.:1560] Out: -  Intake/Output this shift: No intake/output data recorded.  Recent Labs    02/11/18 0451  HGB 11.5*   Recent Labs    02/11/18 0451  WBC 10.0  RBC 3.96*  HCT 34.8*  PLT 236   Recent Labs    02/11/18 0451  NA 132*  K 4.2  CL 101  CO2 23  BUN 18  CREATININE 1.25*  GLUCOSE 196*  CALCIUM 8.2*   No results for input(s): LABPT, INR in the last 72 hours.   Left lower Extremity: Dorsiflexion/Plantar flexion intact Incision: dressing C/D/I Compartment soft    Assessment/Plan: 3 Days Post-Op Procedure(s) (LRB): LEFT TOTAL HIP ARTHROPLASTY ANTERIOR APPROACH (Left) Discharge home with home health    Raymond Avery 02/13/2018, 9:00 AM

## 2018-02-13 NOTE — Discharge Summary (Signed)
Patient ID: Raymond Avery MRN: [id number] DOB/AGE: 10-31-1955 62 y.o.  Admit date: 02/10/2018 Discharge date: 02/13/2018  Admission Diagnoses:  Principal Problem:   Unilateral primary osteoarthritis, left hip Active Problems:   Status post total replacement of left hip   Discharge Diagnoses:  Same  Past Medical History:  Diagnosis Date  . A-fib (HCC)   . Arrhythmia   . Arthritis   . CHF (congestive heart failure) (HCC)   . Diabetes (HCC)    type 2   . Hypertension   . Hyponatremia   . Leukocytosis   . Sleep apnea    dx. 10 years ago never wore mask  . Systemic inflammatory response syndrome (HCC)   . UTI (urinary tract infection)     Surgeries: Procedure(s): LEFT TOTAL HIP ARTHROPLASTY ANTERIOR APPROACH on 02/10/2018   Consultants:   Discharged Condition: Improved  Hospital Course: Raymond Avery is an 62 y.o. male who was admitted 02/10/2018 for operative treatment ofUnilateral primary osteoarthritis, left hip. Patient has severe unremitting pain that affects sleep, daily activities, and work/hobbies. After pre-op clearance the patient was taken to the operating room on 02/10/2018 and underwent  Procedure(s): LEFT TOTAL HIP ARTHROPLASTY ANTERIOR APPROACH.    Patient was given perioperative antibiotics:  Anti-infectives (From admission, onward)   Start     Dose/Rate Route Frequency Ordered Stop   02/10/18 1800  ceFAZolin (ANCEF) IVPB 2g/100 mL premix     2 g 200 mL/hr over 30 Minutes Intravenous Every 6 hours 02/10/18 1616 02/11/18 0120   02/10/18 0600  ceFAZolin (ANCEF) 3 g in dextrose 5 % 50 mL IVPB     3 g 100 mL/hr over 30 Minutes Intravenous On call to O.R. 02/09/18 1219 02/11/18 1344       Patient was given sequential compression devices, early ambulation, and chemoprophylaxis to prevent DVT.  Patient benefited maximally from hospital stay and there were no complications.    Recent vital signs:  Patient Vitals for the past 24 hrs:  BP Temp Temp src Pulse  Resp SpO2  02/13/18 0517 (!) 154/85 99.3 F (37.4 C) Oral 92 20 94 %  02/13/18 0514 (!) 154/85 - - - - -  02/12/18 2241 (!) 181/79 99.8 F (37.7 C) Oral 92 16 91 %  02/12/18 1437 (!) 153/69 99.9 F (37.7 C) Oral (!) 101 16 96 %     Recent laboratory studies:  Recent Labs    02/11/18 0451  WBC 10.0  HGB 11.5*  HCT 34.8*  PLT 236  NA 132*  K 4.2  CL 101  CO2 23  BUN 18  CREATININE 1.25*  GLUCOSE 196*  CALCIUM 8.2*     Discharge Medications:   Allergies as of 02/13/2018   No Known Allergies     Medication List    TAKE these medications   aspirin 81 MG chewable tablet Chew 1 tablet (81 mg total) by mouth 2 (two) times daily.   enalapril 20 MG tablet Commonly known as:  VASOTEC Take 1 tablet (20 mg total) by mouth 2 (two) times daily.   glipiZIDE 10 MG tablet Commonly known as:  GLUCOTROL TAKE ONE TABLET BY MOUTH EVERY DAY BEFORE BREAKFAST   hydrALAZINE 100 MG tablet Commonly known as:  APRESOLINE Take 1 tablet (100 mg total) by mouth 3 (three) times daily.   hydrochlorothiazide 25 MG tablet Commonly known as:  HYDRODIURIL Take 25 mg by mouth twice daily   methocarbamol 500 MG tablet Commonly known as:  ROBAXIN Take 1 tablet (500  mg total) by mouth every 6 (six) hours as needed for muscle spasms.   metoprolol succinate 25 MG 24 hr tablet Commonly known as:  TOPROL-XL Take 1 tablet (25 mg total) by mouth daily.   oxyCODONE 5 MG immediate release tablet Commonly known as:  Oxy IR/ROXICODONE Take 1-2 tablets (5-10 mg total) by mouth every 4 (four) hours as needed for moderate pain (pain score 4-6).            Durable Medical Equipment  (From admission, onward)        Start     Ordered   02/10/18 1616  DME 3 n 1  Once     02/10/18 1616   02/10/18 1616  DME Walker rolling  Once    Question:  Patient needs a walker to treat with the following condition  Answer:  Status post total replacement of left hip   02/10/18 1616      Diagnostic  Studies: Dg Pelvis Portable  Result Date: 02/10/2018 CLINICAL DATA:  Status post left total hip replacement. EXAM: PORTABLE PELVIS 1-2 VIEWS COMPARISON:  Radiographs of Jan 30, 2018. FINDINGS: The femoral and acetabular components appear to be well situated. No fracture or dislocation is noted. Expected postoperative changes are seen in the surrounding soft tissues. IMPRESSION: Status post left total hip arthroplasty. Electronically Signed   By: Lupita Raider, M.D.   On: 02/10/2018 14:42   Dg C-arm 1-60 Min-no Report  Result Date: 02/10/2018 Fluoroscopy was utilized by the requesting physician.  No radiographic interpretation.   Dg Hip Operative Unilat W Or W/o Pelvis Left  Result Date: 02/10/2018 CLINICAL DATA:  Status post total hip replacement EXAM: OPERATIVE LEFT HIP (WITH PELVIS IF PERFORMED) 1 VIEW TECHNIQUE: Fluoroscopic spot image(s) were submitted for interpretation post-operatively. COMPARISON:  Pelvis and left hip radiographs Jan 30, 2018 FLUOROSCOPY TIME:  0 minutes 41 seconds; 10 mGy; 2 acquired images FINDINGS: There is a total hip replacement on the left with prosthetic components appearing well-seated on frontal view. No fracture or dislocation. Penile prosthesis noted. IMPRESSION: Total hip replacement prosthetic components on the left appear well seated on frontal view. No fracture or dislocation. Penile prosthesis noted. Electronically Signed   By: Bretta Bang III M.D.   On: 02/10/2018 13:39   Xr Hip Unilat W Or W/o Pelvis 2-3 Views Left  Result Date: 01/30/2018 An AP pelvis and lateral left hip show severe end-stage arthritis of left hip.  Right hip has only minimal arthritic changes.  Left hip shows complete loss of joint space with cystic changes in the femoral head and acetabulum as well as sclerotic changes.   Disposition:     Follow-up Information    Kathryne Hitch, MD. Schedule an appointment as soon as possible for a visit in 2 week(s).   Specialty:   Orthopedic Surgery Contact information: [address] [place] [address] [phone]            Signed: Richardean Canal 02/13/2018, 9:04 AM

## 2018-02-23 ENCOUNTER — Encounter (INDEPENDENT_AMBULATORY_CARE_PROVIDER_SITE_OTHER): Payer: Self-pay | Admitting: Orthopaedic Surgery

## 2018-02-23 ENCOUNTER — Ambulatory Visit: Payer: Self-pay

## 2018-02-23 ENCOUNTER — Ambulatory Visit (INDEPENDENT_AMBULATORY_CARE_PROVIDER_SITE_OTHER): Payer: Self-pay | Admitting: Orthopaedic Surgery

## 2018-02-23 DIAGNOSIS — Z96642 Presence of left artificial hip joint: Secondary | ICD-10-CM

## 2018-02-23 NOTE — Progress Notes (Signed)
The patient is 2 weeks tomorrow status post a left total hip arthroplasty.  He is doing well overall.  He says range of motion strength and increasing each day.  His left knee is bothering him still.  This is what we originally saw him for in terms of his left knee.  On exam his left hip incision looks great.  I did place new Steri-Strips.  There is no significant seroma either.  His left knee is still painful to him and some of this is related to surgery and some of this is related to his hip.  He also has some arthritic changes in a meniscal tear in that knee.  At this point I did provide a steroid injection left knee to help things calm down.  I will see him back in 4 weeks to see how is doing overall in terms of his gait.  He is ambulate with just a cane now.  In 4 weeks we can determine whether or not we will set him up for an outpatient left knee arthroscopy.

## 2018-02-27 ENCOUNTER — Other Ambulatory Visit (INDEPENDENT_AMBULATORY_CARE_PROVIDER_SITE_OTHER): Payer: Self-pay

## 2018-02-27 ENCOUNTER — Telehealth (INDEPENDENT_AMBULATORY_CARE_PROVIDER_SITE_OTHER): Payer: Self-pay | Admitting: Orthopaedic Surgery

## 2018-02-27 MED ORDER — AMOXICILLIN 500 MG PO TABS: ORAL_TABLET | ORAL | 0 refills | Status: DC

## 2018-02-27 NOTE — Telephone Encounter (Signed)
Patient aware that I called in the Rx for him because he does need this until September Also wrote a letter for his dentist

## 2018-02-27 NOTE — Telephone Encounter (Signed)
Patient called saying he is needing a note for his dentist stating that he doesn't need antibiotics before a cleaning. If that could be typed up and placed upfront for him to pick up. Thank you. CB # [phone]

## 2018-03-16 ENCOUNTER — Encounter: Payer: Self-pay | Admitting: Adult Health

## 2018-03-16 ENCOUNTER — Ambulatory Visit: Payer: Self-pay | Admitting: Adult Health

## 2018-03-16 VITALS — BP 147/71 | HR 67 | Temp 97.7°F | Ht 72.0 in | Wt 285.0 lb

## 2018-03-16 DIAGNOSIS — N182 Chronic kidney disease, stage 2 (mild): Secondary | ICD-10-CM

## 2018-03-16 DIAGNOSIS — M1710 Unilateral primary osteoarthritis, unspecified knee: Secondary | ICD-10-CM

## 2018-03-16 DIAGNOSIS — E1159 Type 2 diabetes mellitus with other circulatory complications: Secondary | ICD-10-CM

## 2018-03-16 DIAGNOSIS — I10 Essential (primary) hypertension: Secondary | ICD-10-CM

## 2018-03-16 DIAGNOSIS — M1612 Unilateral primary osteoarthritis, left hip: Secondary | ICD-10-CM

## 2018-03-16 DIAGNOSIS — I152 Hypertension secondary to endocrine disorders: Secondary | ICD-10-CM

## 2018-03-16 DIAGNOSIS — E1122 Type 2 diabetes mellitus with diabetic chronic kidney disease: Secondary | ICD-10-CM

## 2018-03-16 NOTE — Progress Notes (Signed)
Patient: Raymond AbtsJohn Darrow Male    DOB: 02/26/1956   62 y.o.   MRN: [id number] Visit Date: 03/16/2018  Today's Provider: Shawn RouteMagddalene S Tukov-Yual, NP   Chief Complaint  Patient presents with   Follow-up   Subjective:    HPI Patient presents for f/u hypertension, T2DM, left knee pain and left hip pain. He just had a hip replacement surgery and reports doing well. He reports walking better and his pain is moderate. He seldom uses the pain medication and muscle relaxant that he was given at the hospital. He does not  Monitor his blood glucose or blood pressure at home. He report episodes of dizziness and blurred vision in the morning after he takes his medications. States that symptoms resolve after he lies down. Denies chest pain, palpitations, nausea and vomiting.  No Known Allergies Previous Medications   AMOXICILLIN (AMOXIL) 500 MG TABLET    Take 2 tabs by mouth one hour before dental appointment, then 2 tabs by mouth six hours after appointment.   ASPIRIN 81 MG CHEWABLE TABLET    Chew 1 tablet (81 mg total) by mouth 2 (two) times daily.   ENALAPRIL (VASOTEC) 20 MG TABLET    Take 1 tablet (20 mg total) by mouth 2 (two) times daily.   GLIPIZIDE (GLUCOTROL) 10 MG TABLET    TAKE ONE TABLET BY MOUTH EVERY DAY BEFORE BREAKFAST   HYDRALAZINE (APRESOLINE) 100 MG TABLET    Take 1 tablet (100 mg total) by mouth 3 (three) times daily.   HYDROCHLOROTHIAZIDE (HYDRODIURIL) 25 MG TABLET    Take 25 mg by mouth twice daily   METHOCARBAMOL (ROBAXIN) 500 MG TABLET    Take 1 tablet (500 mg total) by mouth every 6 (six) hours as needed for muscle spasms.   METOPROLOL SUCCINATE (TOPROL-XL) 25 MG 24 HR TABLET    Take 1 tablet (25 mg total) by mouth daily.   OXYCODONE (OXY IR/ROXICODONE) 5 MG IMMEDIATE RELEASE TABLET    Take 1-2 tablets (5-10 mg total) by mouth every 4 (four) hours as needed for moderate pain (pain score 4-6).    Review of Systems  Constitutional: Negative.   Eyes: Positive for visual disturbance  (transient blurred vision ).  Respiratory: Negative.   Cardiovascular: Negative.   Gastrointestinal: Negative.   Endocrine: Negative.   Musculoskeletal: Positive for arthralgias (left hip and knee pain) and gait problem (uses a cane).  Skin: Negative.   Neurological: Positive for dizziness. Negative for syncope and headaches.    Social History   Tobacco Use   Smoking status: Never Smoker   Smokeless tobacco: Never Used  Substance Use Topics   Alcohol use: Yes    Alcohol/week: 8.4 oz    Types: 14 Cans of beer per week    Comment: beer  daily   Objective:   BP (!) 147/71 (BP Location: Left Arm, Patient Position: Sitting)    Pulse 67    Temp 97.7 F (36.5 C) (Oral)    Ht 6' (1.829 m)    Wt 285 lb (129.3 kg)    BMI 38.65 kg/m   Physical Exam  Constitutional: He is oriented to person, place, and time. He appears well-nourished.  Eyes: Pupils are equal, round, and reactive to light. EOM are normal.  Neck: Normal range of motion. Neck supple.  Cardiovascular: Normal rate, regular rhythm, normal heart sounds and intact distal pulses.  Pulmonary/Chest: Effort normal and breath sounds normal.  Abdominal: Soft. Bowel sounds are normal.  Musculoskeletal: He exhibits no edema.  Limited ROM in left knee, mild pain with flexion and extension of the left hip  Neurological: He is alert and oriented to person, place, and time.  Skin: Skin is warm and dry.  Nursing note and vitals reviewed.       Assessment & Plan:  1. Unilateral primary osteoarthritis, left hip S/p left knee replacement. Continue pain medications, daily exercise and f/u with otho  2. Arthrosis of knee F/U with ortho  3. Type 2 diabetes mellitus with stage 2 chronic kidney disease, without long-term current use of insulin (HCC) Last HgA1C at goal. Continue current medications and lifestyle modification efforts  4. Hypertension associated with diabetes (HCC) Now with transient dizziness and blurred vision; like  due to hypotension and orthostasis from medications. Will stagger BP meds. Patient has been advised to take hydralazine and HCTZ in the morning and the rest in the evening. If symptoms persistent, RTC  5. Morbid obesity: Continue weight loss efforts  Shawn Route, NP   [facility]

## 2018-03-20 ENCOUNTER — Ambulatory Visit (INDEPENDENT_AMBULATORY_CARE_PROVIDER_SITE_OTHER): Payer: Self-pay | Admitting: Orthopaedic Surgery

## 2018-03-20 ENCOUNTER — Encounter (INDEPENDENT_AMBULATORY_CARE_PROVIDER_SITE_OTHER): Payer: Self-pay | Admitting: Orthopaedic Surgery

## 2018-03-20 DIAGNOSIS — M23322 Other meniscus derangements, posterior horn of medial meniscus, left knee: Secondary | ICD-10-CM | POA: Insufficient documentation

## 2018-03-20 DIAGNOSIS — M1712 Unilateral primary osteoarthritis, left knee: Secondary | ICD-10-CM

## 2018-03-20 DIAGNOSIS — Z96642 Presence of left artificial hip joint: Secondary | ICD-10-CM

## 2018-03-20 NOTE — Progress Notes (Signed)
The patient is in postoperative follow-up for a left total hip arthroplasty.  This was done 38 days ago.  He is ambulate with a cane and reports some hip stiffness.  His left knee is with bothering him the most.  He originally saw me for his left knee.  He is part of the [place] discount program.  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  Now that he is doing much better with the left hip he would like to proceed with a left knee arthroscopy before his benefits with the [place] discount program run out.  On exam his left hip seems doing well.  Incisions well-healed he tolerates me putting through through internal extra rotation of the left hip.  His left knee does show moderate effusion.  He has medial lateral joint line tenderness and patellofemoral crepitation.  At this point I do feel comfortable with setting up for left knee arthroscopy at the end of this month for a chondroplasty of all 3 compartments and likely partial medial meniscectomy.  He understands this is outpatient surgery but we would do with him to come system based on him being part of current health discount program.  He understands fully the risk and benefits of an arthroscopic intervention of his left knee and I explained what the surgery involves including risk and benefits involved.  We talked about his intraoperative and postoperative course.  We will work on getting the surgery set up and we will see him back in 1 week postoperative for suture removal.

## 2018-05-03 ENCOUNTER — Encounter (HOSPITAL_COMMUNITY): Payer: Self-pay | Admitting: *Deleted

## 2018-05-09 NOTE — Patient Instructions (Addendum)
Raymond AbtsJohn Avery  05/09/2018   Your procedure is scheduled on: 05-19-18  Report [facility]  Entr[facility]  Report to admitting at 1015 AM    Call this number if you have problems the morning of surgery [phone]   Remember: Do not eat food or drink liquids :After Midnight.  How to Manage Your Diabetes Before and After Surgery  Why is it important to control my blood sugar before and after surgery? . Improving blood sugar levels before and after surgery helps healing and can limit problems. . A way of improving blood sugar control is eating a healthy diet by: o  Eating less sugar and carbohydrates o  Increasing activity/exercise o  Talking with your doctor about reaching your blood sugar goals . High blood sugars (greater than 180 mg/dL) can raise your risk of infections and slow your recovery, so you will need to focus on controlling your diabetes during the weeks before surgery. . Make sure that the doctor who takes care of your diabetes knows about your planned surgery including the date and location.  o The goal for blood sugar control after surgery is 80-180 mg/dL.   WHAT DO I DO ABOUT MY DIABETES MEDICATION?  Marland Kitchen. Do not take oral diabetes medicines (pills) the morning of surgery.  . THE DAY BEFORE SURGERY,TAKE YOUR GLIPIZIDE AS USUAL     Patient Signature:  Date:   Nurse Signature:  Date:   Reviewed and Endorsed by [facility] Patient Education Committee, August 2015   Take these medicines the morning of surgery with A SIP OF WATER: hydrazaline, metoprolol succinate DO NOT TAKE ANY DIABETIC MEDICATIONS DAY OF YOUR SURGERY                               You may not have any metal on your body including hair pins and              piercings  Do not wear jewelry, make-up, lotions, powders or perfumes, deodorant             Do not wear nail polish.  Do not shave  48 hours prior to surgery.              Men may shave face and neck.   Do not bring  valuables to the hospital. [facility] IS NOT             RESPONSIBLE   FOR VALUABLES.  Contacts, dentures or bridgework may not be worn into surgery.  Leave suitcase in the car. After surgery it may be brought to your room.     Patients discharged the day of surgery will not be allowed to drive home.  Name and phone number of your driver: friend nancy will bring number day of surgery  Special Instructions: N/A              Please read over the following fact sheets you were given: _____________________________________________________________________             [facility] - Preparing for Surgery Before surgery, you can play an important role.  Because skin is not sterile, your skin needs to be as free of germs as possible.  You can reduce the number of germs on your skin by washing with CHG (chlorahexidine gluconate) soap before surgery.  CHG is an antiseptic  cleaner which kills germs and bonds with the skin to continue killing germs even after washing. Please DO NOT use if you have an allergy to CHG or antibacterial soaps.  If your skin becomes reddened/irritated stop using the CHG and inform your nurse when you arrive at Short Stay. Do not shave (including legs and underarms) for at least 48 hours prior to the first CHG shower.  You may shave your face/neck. Please follow these instructions carefully:  1.  Shower with CHG Soap the night before surgery and the  morning of Surgery.  2.  If you choose to wash your hair, wash your hair first as usual with your  normal  shampoo.  3.  After you shampoo, rinse your hair and body thoroughly to remove the  shampoo.                           4.  Use CHG as you would any other liquid soap.  You can apply chg directly  to the skin and wash                       Gently with a scrungie or clean washcloth.  5.  Apply the CHG Soap to your body ONLY FROM THE NECK DOWN.   Do not use on face/ open                           Wound or open sores. Avoid  contact with eyes, ears mouth and genitals (private parts).                       Wash face,  Genitals (private parts) with your normal soap.             6.  Wash thoroughly, paying special attention to the area where your surgery  will be performed.  7.  Thoroughly rinse your body with warm water from the neck down.  8.  DO NOT shower/wash with your normal soap after using and rinsing off  the CHG Soap.                9.  Pat yourself dry with a clean towel.            10.  Wear clean pajamas.            11.  Place clean sheets on your bed the night of your first shower and do not  sleep with pets. Day of Surgery : Do not apply any lotions/deodorants the morning of surgery.  Please wear clean clothes to the hospital/surgery center.

## 2018-05-09 NOTE — Progress Notes (Signed)
Need orders for 04-22-18-surgery, pre op is 05-12-09 1000 am

## 2018-05-09 NOTE — Progress Notes (Signed)
EKG 02-03-18 EPIC

## 2018-05-11 ENCOUNTER — Other Ambulatory Visit (INDEPENDENT_AMBULATORY_CARE_PROVIDER_SITE_OTHER): Payer: Self-pay | Admitting: Orthopaedic Surgery

## 2018-05-11 DIAGNOSIS — M23322 Other meniscus derangements, posterior horn of medial meniscus, left knee: Secondary | ICD-10-CM

## 2018-05-12 ENCOUNTER — Other Ambulatory Visit: Payer: Self-pay

## 2018-05-12 ENCOUNTER — Encounter (HOSPITAL_COMMUNITY)
Admission: RE | Admit: 2018-05-12 | Discharge: 2018-05-12 | Disposition: A | Discharge status: Home / Self Care | Payer: Self-pay | Source: Ambulatory Visit | Attending: Orthopaedic Surgery | Admitting: Orthopaedic Surgery

## 2018-05-12 ENCOUNTER — Encounter (HOSPITAL_COMMUNITY): Payer: Self-pay

## 2018-05-12 DIAGNOSIS — Z01812 Encounter for preprocedural laboratory examination: Secondary | ICD-10-CM | POA: Insufficient documentation

## 2018-05-12 LAB — HEMOGLOBIN A1C
HEMOGLOBIN A1C: 6 % — AB (ref 4.8–5.6)
MEAN PLASMA GLUCOSE: 125.5 mg/dL

## 2018-05-12 LAB — COMPREHENSIVE METABOLIC PANEL
ALT: 11 U/L (ref 0–44)
AST: 14 U/L — AB (ref 15–41)
Albumin: 3.2 g/dL — ABNORMAL LOW (ref 3.5–5.0)
Alkaline Phosphatase: 69 U/L (ref 38–126)
Anion gap: 8 (ref 5–15)
BUN: 16 mg/dL (ref 8–23)
CHLORIDE: 107 mmol/L (ref 98–111)
CO2: 26 mmol/L (ref 22–32)
Calcium: 8.7 mg/dL — ABNORMAL LOW (ref 8.9–10.3)
Creatinine, Ser: 1.32 mg/dL — ABNORMAL HIGH (ref 0.61–1.24)
GFR, EST NON AFRICAN AMERICAN: 55 mL/min — AB (ref >60)
Glucose, Bld: 98 mg/dL (ref 70–99)
POTASSIUM: 4.2 mmol/L (ref 3.5–5.1)
SODIUM: 141 mmol/L (ref 135–145)
Total Bilirubin: 0.7 mg/dL (ref 0.3–1.2)
Total Protein: 7.1 g/dL (ref 6.5–8.1)

## 2018-05-12 LAB — CBC
HEMATOCRIT: 43 % (ref 39.0–52.0)
Hemoglobin: 14.5 g/dL (ref 13.0–17.0)
MCH: 30.2 pg (ref 26.0–34.0)
MCHC: 33.7 g/dL (ref 30.0–36.0)
MCV: 89.6 fL (ref 78.0–100.0)
Platelets: 286 10*3/uL (ref 150–400)
RBC: 4.8 MIL/uL (ref 4.22–5.81)
RDW: 14.6 % (ref 11.5–15.5)
WBC: 9.2 10*3/uL (ref 4.0–10.5)

## 2018-05-12 LAB — GLUCOSE, CAPILLARY: Glucose-Capillary: 121 mg/dL — ABNORMAL HIGH (ref 70–99)

## 2018-05-12 NOTE — Progress Notes (Signed)
cmet results routed to dr Cristal Deerchristopher blackman epic inbasket

## 2018-05-18 MED ORDER — DEXTROSE 5 % IV SOLN
3.0000 g | INTRAVENOUS | Status: AC
  Administered 2018-05-19: 3 g via INTRAVENOUS
  Filled 2018-05-18: qty 3

## 2018-05-19 ENCOUNTER — Ambulatory Visit (HOSPITAL_COMMUNITY)
Admission: RE | Admit: 2018-05-19 | Discharge: 2018-05-19 | Disposition: A | Discharge status: Home / Self Care | Payer: Self-pay | Source: Ambulatory Visit | Attending: Orthopaedic Surgery | Admitting: Orthopaedic Surgery

## 2018-05-19 ENCOUNTER — Encounter (HOSPITAL_COMMUNITY)
Admission: RE | Disposition: A | Discharge status: Home / Self Care | Payer: Self-pay | Source: Ambulatory Visit | Attending: Orthopaedic Surgery

## 2018-05-19 ENCOUNTER — Ambulatory Visit (HOSPITAL_COMMUNITY): Payer: Self-pay | Admitting: Certified Registered Nurse Anesthetist

## 2018-05-19 ENCOUNTER — Encounter (HOSPITAL_COMMUNITY): Payer: Self-pay

## 2018-05-19 DIAGNOSIS — Z7984 Long term (current) use of oral hypoglycemic drugs: Secondary | ICD-10-CM | POA: Insufficient documentation

## 2018-05-19 DIAGNOSIS — Z7982 Long term (current) use of aspirin: Secondary | ICD-10-CM | POA: Insufficient documentation

## 2018-05-19 DIAGNOSIS — Z96642 Presence of left artificial hip joint: Secondary | ICD-10-CM | POA: Insufficient documentation

## 2018-05-19 DIAGNOSIS — M23252 Derangement of posterior horn of lateral meniscus due to old tear or injury, left knee: Secondary | ICD-10-CM | POA: Insufficient documentation

## 2018-05-19 DIAGNOSIS — G4730 Sleep apnea, unspecified: Secondary | ICD-10-CM | POA: Insufficient documentation

## 2018-05-19 DIAGNOSIS — I110 Hypertensive heart disease with heart failure: Secondary | ICD-10-CM | POA: Insufficient documentation

## 2018-05-19 DIAGNOSIS — I509 Heart failure, unspecified: Secondary | ICD-10-CM | POA: Insufficient documentation

## 2018-05-19 DIAGNOSIS — Z79899 Other long term (current) drug therapy: Secondary | ICD-10-CM | POA: Insufficient documentation

## 2018-05-19 DIAGNOSIS — M23322 Other meniscus derangements, posterior horn of medial meniscus, left knee: Secondary | ICD-10-CM

## 2018-05-19 DIAGNOSIS — Z89422 Acquired absence of other left toe(s): Secondary | ICD-10-CM | POA: Insufficient documentation

## 2018-05-19 DIAGNOSIS — M1712 Unilateral primary osteoarthritis, left knee: Secondary | ICD-10-CM | POA: Insufficient documentation

## 2018-05-19 DIAGNOSIS — M94262 Chondromalacia, left knee: Secondary | ICD-10-CM | POA: Insufficient documentation

## 2018-05-19 DIAGNOSIS — Z89421 Acquired absence of other right toe(s): Secondary | ICD-10-CM | POA: Insufficient documentation

## 2018-05-19 DIAGNOSIS — E1151 Type 2 diabetes mellitus with diabetic peripheral angiopathy without gangrene: Secondary | ICD-10-CM | POA: Insufficient documentation

## 2018-05-19 HISTORY — PX: KNEE ARTHROSCOPY WITH MEDIAL MENISECTOMY: SHX5651

## 2018-05-19 LAB — GLUCOSE, CAPILLARY
GLUCOSE-CAPILLARY: 186 mg/dL — AB (ref 70–99)
Glucose-Capillary: 164 mg/dL — ABNORMAL HIGH (ref 70–99)

## 2018-05-19 SURGERY — ARTHROSCOPY, KNEE, WITH MEDIAL MENISCECTOMY
Anesthesia: General | Site: Knee | Laterality: Left

## 2018-05-19 MED ORDER — MIDAZOLAM HCL 2 MG/2ML IJ SOLN
INTRAMUSCULAR | Status: AC
  Filled 2018-05-19: qty 2

## 2018-05-19 MED ORDER — PROPOFOL 10 MG/ML IV BOLUS
INTRAVENOUS | Status: AC
  Filled 2018-05-19: qty 20

## 2018-05-19 MED ORDER — LIDOCAINE 2% (20 MG/ML) 5 ML SYRINGE
INTRAMUSCULAR | Status: DC | PRN
  Administered 2018-05-19: 100 mg via INTRAVENOUS

## 2018-05-19 MED ORDER — OXYCODONE HCL 5 MG PO TABS: 5.0000 mg | ORAL_TABLET | Freq: Once | ORAL | Status: DC | PRN

## 2018-05-19 MED ORDER — MIDAZOLAM HCL 5 MG/5ML IJ SOLN
INTRAMUSCULAR | Status: DC | PRN
  Administered 2018-05-19: 2 mg via INTRAVENOUS

## 2018-05-19 MED ORDER — ONDANSETRON HCL 4 MG/2ML IJ SOLN
INTRAMUSCULAR | Status: DC | PRN
  Administered 2018-05-19: 4 mg via INTRAVENOUS

## 2018-05-19 MED ORDER — PROMETHAZINE HCL 25 MG/ML IJ SOLN: 6.2500 mg | INTRAMUSCULAR | Status: DC | PRN

## 2018-05-19 MED ORDER — LIDOCAINE 2% (20 MG/ML) 5 ML SYRINGE
INTRAMUSCULAR | Status: AC
  Filled 2018-05-19: qty 5

## 2018-05-19 MED ORDER — EPHEDRINE 5 MG/ML INJ
INTRAVENOUS | Status: AC
  Filled 2018-05-19: qty 10

## 2018-05-19 MED ORDER — ONDANSETRON HCL 4 MG/2ML IJ SOLN
INTRAMUSCULAR | Status: AC
  Filled 2018-05-19: qty 2

## 2018-05-19 MED ORDER — MORPHINE SULFATE (PF) 4 MG/ML IV SOLN
INTRAVENOUS | Status: AC
  Filled 2018-05-19: qty 1

## 2018-05-19 MED ORDER — MORPHINE SULFATE (PF) 4 MG/ML IV SOLN
INTRAVENOUS | Status: DC | PRN
  Administered 2018-05-19: 3 mg via INTRAVENOUS

## 2018-05-19 MED ORDER — MEPERIDINE HCL 50 MG/ML IJ SOLN: 6.2500 mg | INTRAMUSCULAR | Status: DC | PRN

## 2018-05-19 MED ORDER — FENTANYL CITRATE (PF) 100 MCG/2ML IJ SOLN
INTRAMUSCULAR | Status: AC
  Filled 2018-05-19: qty 2

## 2018-05-19 MED ORDER — DEXAMETHASONE SODIUM PHOSPHATE 10 MG/ML IJ SOLN
INTRAMUSCULAR | Status: AC
  Filled 2018-05-19: qty 1

## 2018-05-19 MED ORDER — HYDROMORPHONE HCL 1 MG/ML IJ SOLN: 0.2500 mg | INTRAMUSCULAR | Status: DC | PRN

## 2018-05-19 MED ORDER — FENTANYL CITRATE (PF) 100 MCG/2ML IJ SOLN
INTRAMUSCULAR | Status: DC | PRN
  Administered 2018-05-19 (×2): 50 ug via INTRAVENOUS

## 2018-05-19 MED ORDER — EPHEDRINE SULFATE-NACL 50-0.9 MG/10ML-% IV SOSY
PREFILLED_SYRINGE | INTRAVENOUS | Status: DC | PRN
  Administered 2018-05-19: 5 mg via INTRAVENOUS

## 2018-05-19 MED ORDER — LACTATED RINGERS IV SOLN
INTRAVENOUS | Status: DC
  Administered 2018-05-19 (×2): via INTRAVENOUS

## 2018-05-19 MED ORDER — LACTATED RINGERS IR SOLN
Status: DC | PRN
  Administered 2018-05-19: 3000 mL

## 2018-05-19 MED ORDER — BUPIVACAINE HCL (PF) 0.5 % IJ SOLN
INTRAMUSCULAR | Status: AC
  Filled 2018-05-19: qty 30

## 2018-05-19 MED ORDER — PROPOFOL 10 MG/ML IV BOLUS
INTRAVENOUS | Status: DC | PRN
  Administered 2018-05-19: 250 mg via INTRAVENOUS

## 2018-05-19 MED ORDER — OXYCODONE HCL 5 MG/5ML PO SOLN
5.0000 mg | Freq: Once | ORAL | Status: DC | PRN
  Filled 2018-05-19: qty 5

## 2018-05-19 MED ORDER — BUPIVACAINE HCL (PF) 0.5 % IJ SOLN
INTRAMUSCULAR | Status: DC | PRN
  Administered 2018-05-19: 20 mL

## 2018-05-19 MED ORDER — OXYCODONE HCL 5 MG PO TABS: 5.0000 mg | ORAL_TABLET | ORAL | 0 refills | Status: DC | PRN

## 2018-05-19 MED ORDER — DEXAMETHASONE SODIUM PHOSPHATE 10 MG/ML IJ SOLN
INTRAMUSCULAR | Status: DC | PRN
  Administered 2018-05-19: 10 mg via INTRAVENOUS

## 2018-05-19 SURGICAL SUPPLY — 29 items
BANDAGE ACE 6X5 VEL STRL LF (GAUZE/BANDAGES/DRESSINGS) ×2 IMPLANT
BLADE CUDA SHAVER 3.5 (BLADE) ×2 IMPLANT
COVER SURGICAL LIGHT HANDLE (MISCELLANEOUS) ×2 IMPLANT
CUFF TOURN SGL QUICK 34 (TOURNIQUET CUFF)
CUFF TRNQT CYL 34X4X40X1 (TOURNIQUET CUFF) IMPLANT
DRAPE U-SHAPE 47X51 STRL (DRAPES) ×2 IMPLANT
DRESSING DUODERM 4X4 STERILE (GAUZE/BANDAGES/DRESSINGS) ×2 IMPLANT
DRSG PAD ABDOMINAL 8X10 ST (GAUZE/BANDAGES/DRESSINGS) ×2 IMPLANT
DURAPREP 26ML APPLICATOR (WOUND CARE) ×2 IMPLANT
GAUZE XEROFORM 1X8 LF (GAUZE/BANDAGES/DRESSINGS) ×2 IMPLANT
GLOVE BIO SURGEON STRL SZ7.5 (GLOVE) ×2 IMPLANT
GLOVE BIOGEL PI IND STRL 8 (GLOVE) ×2 IMPLANT
GLOVE BIOGEL PI INDICATOR 8 (GLOVE) ×2
GLOVE ECLIPSE 8.0 STRL XLNG CF (GLOVE) ×2 IMPLANT
GOWN STRL REUS W/TWL XL LVL3 (GOWN DISPOSABLE) ×4 IMPLANT
IV LACTATED RINGER IRRG 3000ML (IV SOLUTION) ×2
IV LR IRRIG 3000ML ARTHROMATIC (IV SOLUTION) ×2 IMPLANT
KIT BASIN OR (CUSTOM PROCEDURE TRAY) IMPLANT
MANIFOLD NEPTUNE II (INSTRUMENTS) ×2 IMPLANT
PACK ARTHROSCOPY WL (CUSTOM PROCEDURE TRAY) ×2 IMPLANT
PAD ABD 8X10 STRL (GAUZE/BANDAGES/DRESSINGS) ×2 IMPLANT
PADDING CAST COTTON 6X4 STRL (CAST SUPPLIES) ×2 IMPLANT
POSITIONER SURGICAL ARM (MISCELLANEOUS) ×2 IMPLANT
SUT ETHILON 4 0 PS 2 18 (SUTURE) ×2 IMPLANT
SYR CONTROL 10ML LL (SYRINGE) ×2 IMPLANT
TOWEL OR 17X26 10 PK STRL BLUE (TOWEL DISPOSABLE) ×2 IMPLANT
TUBING ARTHRO INFLOW-ONLY STRL (TUBING) ×2 IMPLANT
WAND HAND CNTRL MULTIVAC 90 (MISCELLANEOUS) IMPLANT
WRAP KNEE MAXI GEL POST OP (GAUZE/BANDAGES/DRESSINGS) ×2 IMPLANT

## 2018-05-19 NOTE — Anesthesia Preprocedure Evaluation (Signed)
Anesthesia Evaluation  Patient identified by MRN, date of birth, ID band Patient awake    Reviewed: Allergy & Precautions, NPO status , Patient's Chart, lab work & pertinent test results  Airway Mallampati: II  TM Distance: >3 FB Neck ROM: Full    Dental no notable dental hx.    Pulmonary sleep apnea ,    Pulmonary exam normal breath sounds clear to auscultation       Cardiovascular hypertension, Pt. on medications + Peripheral Vascular Disease and +CHF  Normal cardiovascular exam+ dysrhythmias Atrial Fibrillation  Rhythm:Regular Rate:Normal     Neuro/Psych negative neurological ROS  negative psych ROS   GI/Hepatic negative GI ROS, Neg liver ROS,   Endo/Other  diabetes, Type 2, Oral Hypoglycemic Agents  Renal/GU Renal disease     Musculoskeletal  (+) Arthritis , Osteoarthritis,    Abdominal   Peds  Hematology   Anesthesia Other Findings   Reproductive/Obstetrics                             Lab Results  Component Value Date   WBC 9.2 05/12/2018   HGB 14.5 05/12/2018   HCT 43.0 05/12/2018   MCV 89.6 05/12/2018   PLT 286 05/12/2018   Lab Results  Component Value Date   CREATININE 1.32 (H) 05/12/2018   BUN 16 05/12/2018   NA 141 05/12/2018   K 4.2 05/12/2018   CL 107 05/12/2018   CO2 26 05/12/2018   Lab Results  Component Value Date   INR 1.14 08/18/2016     Anesthesia Physical  Anesthesia Plan  ASA: III  Anesthesia Plan: General   Post-op Pain Management:    Induction: Intravenous  PONV Risk Score and Plan: 2 and Ondansetron, Dexamethasone, Treatment may vary due to age or medical condition and Midazolam  Airway Management Planned: LMA  Additional Equipment: None  Intra-op Plan:   Post-operative Plan: Extubation in OR  Informed Consent: I have reviewed the patients History and Physical, chart, labs and discussed the procedure including the risks, benefits  and alternatives for the proposed anesthesia with the patient or authorized representative who has indicated his/her understanding and acceptance.   Dental advisory given  Plan Discussed with: CRNA  Anesthesia Plan Comments:         Anesthesia Quick Evaluation

## 2018-05-19 NOTE — Discharge Instructions (Signed)
Increase activities as tolerated. You may put all of your weight on your left knee as comfort allows. Do expect swelling - ice and elevation as needed. You can remove your dressings tomorrow 8/31 and get our incisions wet daily in the shower. Place band-aids daily over your knee incisions.

## 2018-05-19 NOTE — Brief Op Note (Signed)
05/19/2018  12:30 PM  PATIENT:  Raymond Avery  62 Avery.o. male  PRE-OPERATIVE DIAGNOSIS:  left knee chondromalacia, meniscal tear  POST-OPERATIVE DIAGNOSIS:  left knee chondromalacia, meniscal tear  PROCEDURE:  Procedure(s): LEFT KNEE ARTHROSCOPY WITH CHONDROPLASTY, PARTIAL MENISCECTOMY (Left)  SURGEON:  Surgeon(s) and Role:    Kathryne Hitch* Raymond Nop Y, MD - Primary  PHYSICIAN ASSISTANT: Rexene EdisonGil Clark, PA-C  ANESTHESIA:   local and general  EBL:  5 mL   COUNTS:  YES  DICTATION: .Other Dictation: Dictation Number [id number]  PLAN OF CARE: Discharge to home after PACU  PATIENT DISPOSITION:  PACU - hemodynamically stable.   Delay start of Pharmacological VTE agent (>24hrs) due to surgical blood loss or risk of bleeding: no

## 2018-05-19 NOTE — Transfer of Care (Signed)
Immediate Anesthesia Transfer of Care Note  Patient: Raymond Avery  Procedure(s) Performed: LEFT KNEE ARTHROSCOPY WITH CHONDROPLASTY, PARTIAL MENISCECTOMY (Left Knee)  Patient Location: PACU  Anesthesia Type:General  Level of Consciousness: drowsy and patient cooperative  Airway & Oxygen Therapy: Patient Spontanous Breathing and Patient connected to face mask oxygen  Post-op Assessment: Report given to RN and Post -op Vital signs reviewed and stable  Post vital signs: Reviewed and stable  Last Vitals:  Vitals Value Taken Time  BP 135/79 05/19/2018 12:43 PM  Temp    Pulse 71 05/19/2018 12:45 PM  Resp 18 05/19/2018 12:45 PM  SpO2 99 % 05/19/2018 12:45 PM  Vitals shown include unvalidated device data.  Last Pain:  Vitals:   05/19/18 1039  TempSrc:   PainSc: 0-No pain         Complications: No apparent anesthesia complications

## 2018-05-19 NOTE — Anesthesia Procedure Notes (Signed)
Procedure Name: LMA Insertion Date/Time: 05/19/2018 11:53 AM Performed by: Epimenio SarinJarvela, Selah Zelman R, CRNA Pre-anesthesia Checklist: Patient identified, Emergency Drugs available, Suction available, Patient being monitored and Timeout performed Patient Re-evaluated:Patient Re-evaluated prior to induction Oxygen Delivery Method: Circle system utilized Preoxygenation: Pre-oxygenation with 100% oxygen Induction Type: IV induction LMA: LMA with gastric port inserted LMA Size: 5.0 Number of attempts: 1 Dental Injury: Teeth and Oropharynx as per pre-operative assessment

## 2018-05-19 NOTE — Anesthesia Postprocedure Evaluation (Signed)
Anesthesia Post Note  Patient: Raymond Avery  Procedure(s) Performed: LEFT KNEE ARTHROSCOPY WITH CHONDROPLASTY, PARTIAL MENISCECTOMY (Left Knee)     Patient location during evaluation: PACU Anesthesia Type: General Level of consciousness: sedated and patient cooperative Pain management: pain level controlled Vital Signs Assessment: post-procedure vital signs reviewed and stable Respiratory status: spontaneous breathing Cardiovascular status: stable Anesthetic complications: no    Last Vitals:  Vitals:   05/19/18 1315 05/19/18 1330  BP: 133/78 (!) 168/81  Pulse: (!) 55 (!) 51  Resp: 16   Temp: (!) 36.4 C (!) 36.4 C  SpO2: 98% 99%    Last Pain:  Vitals:   05/19/18 1330  TempSrc:   PainSc: 0-No pain                 Lewie LoronJohn Antolin Belsito

## 2018-05-19 NOTE — Op Note (Signed)
NAME: Raymond Avery, Raymond Avery MEDICAL RECORD [id number] ACCOUNT [id number] DATE OF BIRTH:1956/07/26 FACILITY: [facility] LOCATION: [facility] PHYSICIAN:Mariama Saintvil Aretha Parrot, MD  OPERATIVE REPORT  DATE OF PROCEDURE:  05/19/2018  PREOPERATIVE DIAGNOSIS:  Left knee moderate osteoarthritis and symptomatic lateral meniscal tear.  POSTOPERATIVE DIAGNOSIS:  Left knee moderate osteoarthritis and symptomatic lateral meniscal tear.  PROCEDURE:  Left knee arthroscopy with chondroplasty all 3 compartments and partial lateral meniscectomy.  SURGEON:  Vanita Panda. Magnus Ivan, MD  ASSISTANT:  Richardean Canal, PA-C  ANESTHESIA: 1.  General. 2.  Local with a mixture of plain Marcaine and morphine.  ESTIMATED BLOOD LOSS:  Minimal.  COMPLICATIONS:  None.  ANTIBIOTICS:  3 grams IV Ancef.  INDICATIONS:  The patient is a 62 year old gentleman well known to me.  He is part of the [facility].  He came in with significant left knee issues and had an MRI and had been told by other physicians that he needed a left knee  arthroscopy.  His pain was significant in his left knee, but when I saw him in the office, his hip pain was quite significant and he was very stiff.  X-rays showed complete end-stage arthritis of the left hip.  He then underwent a successful left total  hip arthroplasty and has done well.  However, his left knee still gives him problems with swelling, locking and catching.  At this point, he does wish to proceed with an arthroscopic intervention.  I had a long and thorough discussion with him about the  risks and benefits of surgery and he understands that arthroscopic surgery does not treat arthritic changes, but it can help with the meniscal issues.  The risks and benefits of surgery explained to him in detail and he does wish to proceed.  DESCRIPTION OF PROCEDURE:  After informed consent was obtained and appropriate left knee was marked, he was brought to the operating room and  placed supine on the operating table.  General anesthesia was then obtained.  His left thigh, knee, leg and  ankle were prepped and draped with DuraPrep and sterile drapes including a sterile stockinette with the bed raised and a lateral leg post-utilized.  The left operative knee was flexed off the table.  A time-out was called.  He was identified, correct  patient, correct left knee.  I then made an anterior lateral arthroscopy portal and inserted the cannula in the knee did not find any significant effusion.  I went to the medial compartment of the knee, made an anterior medial incision and portal.  We  found about grade II chondromalacia of the medial femoral condyle and the medial meniscus was intact.  ACL and PCL were intact.  The lateral side had at least grade III chondromalacia of the lateral femoral condyle and lateral tibial plateau and he had a  chronic degenerative lateral meniscal tear from the posterior horn to the midbody.  Using arthroscopic up-cutting and straight basket forceps and biters as well as arthroscopic shaver, I was able to perform a partial lateral meniscectomy.  I then had  the knee extended with the patellofemoral joint and we did find some grade IV chondromalacia underneath the patella and the trochlear groove and we debrided this back to a stable margin.  We then allowed fluid lavage of the knee and drained all the fluid  from the knee.  We closed the portal sites with interrupted nylon suture and then inserted a mixture of morphine and Marcaine into the knee and portal sites.  Xeroform well-padded  sterile dressing applied.  He was awakened, extubated, and taken to  recovery room in stable condition.  All final counts were correct.  There were no complications noted.  Of note, Rexene EdisonGil Clark, PA-C, did assisted during the entire case.  From the hospital, he will be discharged from the recovery room to home with  weightbearing as tolerated and followup in the office in a  week.  TN/NUANCE  D:05/19/2018 T:05/19/2018 JOB:[id number]

## 2018-05-19 NOTE — H&P (Signed)
Raymond AbtsJohn Avery is an 62 y.o. male.   Chief Complaint:   Left knee pain with locking and catching HPI:   62 yo male with left knee arthritis and a symptomatic medial meniscal tear.  He has had locking and catching and has failed conservative treatment.  At this point, he wishes to proceed with a left knee arthroscopy.  This has been discussed with him in detail on multiple outpatient clinic visits.  Past Medical History:  Diagnosis Date  . A-fib (HCC)    hx of 6-7 yrs , resolved no cardiologist  . Arrhythmia   . Arthritis   . CHF (congestive heart failure) (HCC)    hx of 15 yrs ago  . Diabetes (HCC)    type 2   . Hypertension   . Hyponatremia   . Leukocytosis   . Sleep apnea    dx. 10 years ago never wore mask  . Systemic inflammatory response syndrome (HCC)   . UTI (urinary tract infection)    hx of    Past Surgical History:  Procedure Laterality Date  . AMPUTATION TOE Left 06/10/2015   Procedure: Left great toe amputation ;  Surgeon: Linus Galasodd Cline, MD;  Location: [facility] ORS;  Service: Podiatry;  Laterality: Left;and right  . IRRIGATION AND DEBRIDEMENT FOOT Right 08/18/2016   Procedure: IRRIGATION AND DEBRIDEMENT FOOT ABSCESS AND BONE;  Surgeon: Linus Galasodd Cline, DPM;  Location: [facility] ORS;  Service: Podiatry;  Laterality: Right;  . JOINT REPLACEMENT     Left total hip  Dr. Magnus IvanBlackman    02-10-18   . PENILE PROSTHESIS IMPLANT    . right great toe amputaion  before 2016  . TONSILLECTOMY    . TOTAL HIP ARTHROPLASTY Left 02/10/2018   Procedure: LEFT TOTAL HIP ARTHROPLASTY ANTERIOR APPROACH;  Surgeon: Kathryne HitchBlackman, Harveen Flesch Y, MD;  Location: [facility] ORS;  Service: Orthopedics;  Laterality: Left;    Family History  Problem Relation Age of Onset  . Heart attack Mother   . Cancer Father    Social History:  reports that he has never smoked. He has never used smokeless tobacco. He reports that he drinks about 14.0 standard drinks of alcohol per week. He reports that he does not use drugs.  Allergies: No  Known Allergies  No medications prior to admission.    No results found for this or any previous visit (from the past 48 hour(s)). No results found.  Review of Systems  All other systems reviewed and are negative.   There were no vitals taken for this visit. Physical Exam  Constitutional: He is oriented to person, place, and time. He appears well-developed and well-nourished.  HENT:  Head: Normocephalic and atraumatic.  Eyes: Pupils are equal, round, and reactive to light. EOM are normal.  Neck: Normal range of motion. Neck supple.  Cardiovascular: Normal rate.  Respiratory: Effort normal.  GI: Soft. Bowel sounds are normal.  Musculoskeletal:       Left knee: He exhibits decreased range of motion, swelling and abnormal alignment. Tenderness found. Medial joint line and lateral joint line tenderness noted.  Neurological: He is alert and oriented to person, place, and time.  Skin: Skin is warm and dry.  Psychiatric: He has a normal mood and affect.     Assessment/Plan Left knee OA with a symptomatic medial meniscal tear.  To the OR today as an outpatient for a left knee arthroscopy.  Kathryne Hitchhristopher Y Ashok Sawaya, MD 05/19/2018, 7:21 AM

## 2018-05-20 ENCOUNTER — Encounter (HOSPITAL_COMMUNITY): Payer: Self-pay | Admitting: Orthopaedic Surgery

## 2018-05-29 ENCOUNTER — Ambulatory Visit (INDEPENDENT_AMBULATORY_CARE_PROVIDER_SITE_OTHER): Payer: Self-pay | Admitting: Physician Assistant

## 2018-05-29 ENCOUNTER — Encounter (INDEPENDENT_AMBULATORY_CARE_PROVIDER_SITE_OTHER): Payer: Self-pay | Admitting: Physician Assistant

## 2018-05-29 VITALS — Ht 75.0 in | Wt 293.0 lb

## 2018-05-29 DIAGNOSIS — Z9889 Other specified postprocedural states: Secondary | ICD-10-CM

## 2018-05-29 NOTE — Progress Notes (Signed)
HPI: Raymond Avery returns today one week status post left knee arthroscopy.  He states overall that his knee is doing very well.  Said no chest pain shortness breath fevers chills.  He underwent a left knee arthroscopy with chondroplasty all 3 compartments and partial lateral meniscectomy.  He had grade 2 changes medial compartment and the lateral compartment grade 4 changes underneath the patella in the trochlear groove.  Physical exam: Left knee good range of motion without pain.  Calf supple nontender.  Port sites are well approximated with running sutures no signs of infection.  Impression: Status post left knee arthroscopy 1 week Left knee osteoarthritis  Plan: He will work on Dance movement psychotherapist.  He may benefit from cortisone and/or supplemental injection in the future.  Discussed natural anti-inflammatories with him.  He will follow-up with [place] on as-needed basis pain or if he has any questions concerns.  Sutures were removed today.  He will work on scar tissue mobilization of the port sites.  He will follow-up with [place] in May 2020 for AP pelvis and lateral view of his left hip.

## 2018-06-14 ENCOUNTER — Other Ambulatory Visit: Payer: Self-pay

## 2018-06-14 DIAGNOSIS — M1710 Unilateral primary osteoarthritis, unspecified knee: Secondary | ICD-10-CM

## 2018-06-14 DIAGNOSIS — I152 Hypertension secondary to endocrine disorders: Secondary | ICD-10-CM

## 2018-06-14 DIAGNOSIS — N182 Chronic kidney disease, stage 2 (mild): Secondary | ICD-10-CM

## 2018-06-14 DIAGNOSIS — M1612 Unilateral primary osteoarthritis, left hip: Secondary | ICD-10-CM

## 2018-06-14 DIAGNOSIS — I10 Essential (primary) hypertension: Secondary | ICD-10-CM

## 2018-06-14 DIAGNOSIS — E1159 Type 2 diabetes mellitus with other circulatory complications: Secondary | ICD-10-CM

## 2018-06-14 DIAGNOSIS — E1122 Type 2 diabetes mellitus with diabetic chronic kidney disease: Secondary | ICD-10-CM

## 2018-06-15 LAB — COMPREHENSIVE METABOLIC PANEL
A/G RATIO: 1.2 (ref 1.2–2.2)
ALBUMIN: 3.5 g/dL — AB (ref 3.6–4.8)
ALK PHOS: 77 IU/L (ref 39–117)
ALT: 8 IU/L (ref 0–44)
AST: 10 IU/L (ref 0–40)
BILIRUBIN TOTAL: 0.6 mg/dL (ref 0.0–1.2)
BUN / CREAT RATIO: 13 (ref 10–24)
BUN: 18 mg/dL (ref 8–27)
CHLORIDE: 99 mmol/L (ref 96–106)
CO2: 25 mmol/L (ref 20–29)
Calcium: 9.2 mg/dL (ref 8.6–10.2)
Creatinine, Ser: 1.41 mg/dL — ABNORMAL HIGH (ref 0.76–1.27)
GFR calc Af Amer: 60 mL/min/{1.73_m2} (ref >59)
GFR calc non Af Amer: 52 mL/min/{1.73_m2} — ABNORMAL LOW (ref >59)
GLOBULIN, TOTAL: 2.9 g/dL (ref 1.5–4.5)
Glucose: 161 mg/dL — ABNORMAL HIGH (ref 65–99)
POTASSIUM: 4.6 mmol/L (ref 3.5–5.2)
SODIUM: 138 mmol/L (ref 134–144)
Total Protein: 6.4 g/dL (ref 6.0–8.5)

## 2018-06-15 LAB — CBC WITH DIFFERENTIAL/PLATELET
Basophils Absolute: 0.1 10*3/uL (ref 0.0–0.2)
Basos: 1 %
EOS (ABSOLUTE): 0.2 10*3/uL (ref 0.0–0.4)
EOS: 3 %
HEMATOCRIT: 45.8 % (ref 37.5–51.0)
HEMOGLOBIN: 15.2 g/dL (ref 13.0–17.7)
Immature Grans (Abs): 0.1 10*3/uL (ref 0.0–0.1)
Immature Granulocytes: 1 %
LYMPHS ABS: 1.7 10*3/uL (ref 0.7–3.1)
Lymphs: 21 %
MCH: 29.4 pg (ref 26.6–33.0)
MCHC: 33.2 g/dL (ref 31.5–35.7)
MCV: 89 fL (ref 79–97)
MONOCYTES: 13 %
MONOS ABS: 1.1 10*3/uL — AB (ref 0.1–0.9)
Neutrophils Absolute: 5 10*3/uL (ref 1.4–7.0)
Neutrophils: 61 %
Platelets: 260 10*3/uL (ref 150–450)
RBC: 5.17 x10E6/uL (ref 4.14–5.80)
RDW: 12.7 % (ref 12.3–15.4)
WBC: 8.1 10*3/uL (ref 3.4–10.8)

## 2018-06-15 LAB — LIPID PANEL
CHOL/HDL RATIO: 2.7 ratio (ref 0.0–5.0)
Cholesterol, Total: 169 mg/dL (ref 100–199)
HDL: 62 mg/dL (ref >39)
LDL Calculated: 88 mg/dL (ref 0–99)
Triglycerides: 96 mg/dL (ref 0–149)
VLDL Cholesterol Cal: 19 mg/dL (ref 5–40)

## 2018-06-15 LAB — HEMOGLOBIN A1C
ESTIMATED AVERAGE GLUCOSE: 154 mg/dL
HEMOGLOBIN A1C: 7 % — AB (ref 4.8–5.6)

## 2018-06-22 ENCOUNTER — Other Ambulatory Visit: Payer: Self-pay

## 2018-06-22 ENCOUNTER — Other Ambulatory Visit: Payer: Self-pay | Admitting: Adult Health

## 2018-06-22 ENCOUNTER — Ambulatory Visit: Payer: Self-pay | Admitting: Adult Health

## 2018-06-22 ENCOUNTER — Encounter: Payer: Self-pay | Admitting: Adult Health

## 2018-06-22 VITALS — BP 158/79 | HR 70 | Temp 98.3°F | Ht 75.5 in | Wt 297.0 lb

## 2018-06-22 DIAGNOSIS — I872 Venous insufficiency (chronic) (peripheral): Secondary | ICD-10-CM

## 2018-06-22 DIAGNOSIS — I10 Essential (primary) hypertension: Principal | ICD-10-CM

## 2018-06-22 DIAGNOSIS — N182 Chronic kidney disease, stage 2 (mild): Secondary | ICD-10-CM

## 2018-06-22 DIAGNOSIS — I152 Hypertension secondary to endocrine disorders: Secondary | ICD-10-CM

## 2018-06-22 DIAGNOSIS — Z9889 Other specified postprocedural states: Secondary | ICD-10-CM

## 2018-06-22 DIAGNOSIS — E1122 Type 2 diabetes mellitus with diabetic chronic kidney disease: Secondary | ICD-10-CM

## 2018-06-22 DIAGNOSIS — M23322 Other meniscus derangements, posterior horn of medial meniscus, left knee: Secondary | ICD-10-CM

## 2018-06-22 DIAGNOSIS — M1612 Unilateral primary osteoarthritis, left hip: Secondary | ICD-10-CM

## 2018-06-22 DIAGNOSIS — E1159 Type 2 diabetes mellitus with other circulatory complications: Secondary | ICD-10-CM

## 2018-06-22 MED ORDER — HYDROCHLOROTHIAZIDE 25 MG PO TABS: ORAL_TABLET | ORAL | 3 refills | Status: DC

## 2018-06-22 MED ORDER — HYDRALAZINE HCL 100 MG PO TABS: 100.0000 mg | ORAL_TABLET | Freq: Two times a day (BID) | ORAL | 3 refills | Status: DC

## 2018-06-22 MED ORDER — ENALAPRIL MALEATE 20 MG PO TABS: 20.0000 mg | ORAL_TABLET | Freq: Two times a day (BID) | ORAL | 3 refills | Status: DC

## 2018-06-22 MED ORDER — METOPROLOL SUCCINATE ER 25 MG PO TB24: 25.0000 mg | ORAL_TABLET | Freq: Every day | ORAL | 3 refills | Status: DC

## 2018-06-22 MED ORDER — GLIPIZIDE 10 MG PO TABS: ORAL_TABLET | ORAL | 3 refills | Status: DC

## 2018-06-22 NOTE — Progress Notes (Signed)
Patient: Raymond Avery Male    DOB: December 26, 1955   62 y.o.   MRN: [id number] Visit Date: 06/22/2018  Today's Provider: Deforest Hoyles, NP   Chief Complaint  Patient presents with  . Follow-up   Subjective:    HPI: 62 Y/O male presenting for routine follow-up and for evaluation of left knee pain. He had a LEFT KNEE ARTHROSCOPY WITH CHONDROPLASTY, PARTIAL MENISCECTOMY (Left) on 05/19/2018. He reports doing well. He now walks a lot and performs his ADLs without difficulty. Denies hip and knee pain. He has not been compliant with his medications. He only takes half of the recommended dose of all his medications. He has gained about 12Lbs from his previous visit in June. Denies chest pain, palpitations, dizziness and headache.      No Known Allergies Previous Medications   ENALAPRIL (VASOTEC) 20 MG TABLET    Take 1 tablet (20 mg total) by mouth 2 (two) times daily.   GLIPIZIDE (GLUCOTROL) 10 MG TABLET    TAKE ONE TABLET BY MOUTH EVERY DAY BEFORE BREAKFAST   HYDRALAZINE (APRESOLINE) 100 MG TABLET    Take 1 tablet (100 mg total) by mouth 3 (three) times daily.   HYDROCHLOROTHIAZIDE (HYDRODIURIL) 25 MG TABLET    Take 25 mg by mouth twice daily   METOPROLOL SUCCINATE (TOPROL-XL) 25 MG 24 HR TABLET    Take 1 tablet (25 mg total) by mouth daily.    Review of Systems  Constitutional: Negative.   Respiratory: Negative.   Cardiovascular: Positive for leg swelling (bilateral lower extremity edema).  Gastrointestinal: Negative.   Endocrine: Negative.   Musculoskeletal: Negative.   Skin: Positive for color change (BLLE).    Social History   Tobacco Use  . Smoking status: Never Smoker  . Smokeless tobacco: Never Used  Substance Use Topics  . Alcohol use: Yes    Alcohol/week: 14.0 standard drinks    Types: 14 Cans of beer per week    Comment:  3-4  beers daily   Objective:   BP (!) 158/79 (BP Location: Left Arm, Patient Position: Sitting)   Pulse 70   Temp 98.3 F (36.8 C)   Ht 6'  3.5" (1.918 m)   Wt 297 lb (134.7 kg)   SpO2 97%   BMI 36.63 kg/m   Physical Exam  Constitutional: He is oriented to person, place, and time. He appears well-developed and well-nourished.  HENT:  Mouth/Throat: Oropharynx is clear and moist.  Eyes: Pupils are equal, round, and reactive to light. EOM are normal.  Neck: Normal range of motion.  Cardiovascular: Normal rate, regular rhythm, normal heart sounds and intact distal pulses.  Pulmonary/Chest: Effort normal and breath sounds normal.  Abdominal: Soft. Bowel sounds are normal. He exhibits distension (cnetral obesity).  Musculoskeletal: He exhibits edema (+2 edema, BLLE).  Neurological: He is alert and oriented to person, place, and time.  Skin: Skin is warm and dry.  Psychiatric: He has a normal mood and affect.  Nursing note and vitals reviewed.     Assessment & Plan:   1. Hypertension associated with diabetes ([place]) Poor control due to non-adherence. Need for adherence reinforced with patient. He has promised to start taking his medications as prescribed.  Encouraged to monitor his blood pressure daily and to keep a log of his blood pressure and bring during his next visit. - TSH; Future - Comp Met (CMET); Future - Urine Microalbumin w/creat. ratio; Future - HgB A1c; Future  2. Chronic venous insufficiency Continue diuretics and Ace wraps  to bilateral lower extremities daily. Patient encouraged to report any lower extremity ulcers promptly - TSH; Future - Comp Met (CMET); Future - Urine Microalbumin w/creat. ratio; Future - HgB A1c; Future  3. Type 2 diabetes mellitus with stage 2 chronic kidney disease, without long-term current use of insulin (HCC) Given his nonadherence to his medications, we will obtain repeat labs prior to his next visit.  Lifestyle modification as well as medication adherence reviewed.  Continue current regimen - TSH; Future - Comp Met (CMET); Future - Urine Microalbumin w/creat. ratio; Future -  HgB A1c; Future  4. Unilateral primary osteoarthritis, left hip Now status post  LEFT KNEE ARTHROSCOPY WITH CHONDROPLASTY, PARTIAL MENISCECTOMY  No pain.  Continue follow-up with Ortho as recommended  5. Other meniscus derangements, posterior horn of medial meniscus, left knee Continue follow-up with Ortho as recommended.  Range of motion exercises encouraged.  6. S/P left knee arthroscopy Same as above  7.  Morbid obesity: Weight loss advice given  Deforest Hoyles, NP   [facility]

## 2018-07-01 ENCOUNTER — Encounter: Payer: Self-pay | Admitting: Adult Health

## 2018-08-10 ENCOUNTER — Encounter (INDEPENDENT_AMBULATORY_CARE_PROVIDER_SITE_OTHER): Payer: Self-pay

## 2018-08-10 ENCOUNTER — Ambulatory Visit: Payer: Self-pay | Admitting: Pharmacist

## 2018-08-10 ENCOUNTER — Encounter: Payer: Self-pay | Admitting: Pharmacist

## 2018-08-10 VITALS — BP 138/70 | Ht 75.0 in | Wt 289.0 lb

## 2018-08-10 DIAGNOSIS — Z79899 Other long term (current) drug therapy: Secondary | ICD-10-CM

## 2018-08-10 NOTE — Progress Notes (Addendum)
Medication Management Clinic Visit Note  Patient: Raymond Avery MRN: [id number] Date of Birth: 1955/12/05 PCP: Patient, No Pcp Per   [name] 62 y.o. male presents for a follow-up medication therapy management visit today. Pt reports no sign/symptoms of feeling sick and has not been around anyone that is sick. Pt has not traveled outside of the country in the past month.  BP 138/70 (BP Location: Right Arm, Patient Position: Sitting, Cuff Size: Large)   Ht 6\' 3"  (1.905 m)   Wt 289 lb (131.1 kg)   BMI 36.12 kg/m   Patient Information   Past Medical History:  Diagnosis Date  . A-fib (HCC)    hx of 6-7 yrs , resolved no cardiologist  . Arrhythmia   . Arthritis   . CHF (congestive heart failure) (HCC)    hx of 15 yrs ago  . Diabetes (HCC)    type 2   . Hypertension   . Hyponatremia   . Leukocytosis   . Sleep apnea    dx. 10 years ago never wore mask  . Systemic inflammatory response syndrome (HCC)   . UTI (urinary tract infection)    hx of      Past Surgical History:  Procedure Laterality Date  . AMPUTATION TOE Left 06/10/2015   Procedure: Left great toe amputation ;  Surgeon: Linus Galas, MD;  Location: [facility] ORS;  Service: Podiatry;  Laterality: Left;and right  . IRRIGATION AND DEBRIDEMENT FOOT Right 08/18/2016   Procedure: IRRIGATION AND DEBRIDEMENT FOOT ABSCESS AND BONE;  Surgeon: Linus Galas, DPM;  Location: [facility] ORS;  Service: Podiatry;  Laterality: Right;  . JOINT REPLACEMENT     Left total hip  Dr. Magnus Ivan    02-10-18   . KNEE ARTHROSCOPY WITH MEDIAL MENISECTOMY Left 05/19/2018   Procedure: LEFT KNEE ARTHROSCOPY WITH CHONDROPLASTY, PARTIAL MENISCECTOMY;  Surgeon: Kathryne Hitch, MD;  Location: [facility];  Service: Orthopedics;  Laterality: Left;  . PENILE PROSTHESIS IMPLANT    . right great toe amputaion  before 2016  . TONSILLECTOMY    . TOTAL HIP ARTHROPLASTY Left 02/10/2018   Procedure: LEFT TOTAL HIP ARTHROPLASTY ANTERIOR APPROACH;  Surgeon: Kathryne Hitch, MD;  Location: [facility];  Service: Orthopedics;  Laterality: Left;     Family History  Problem Relation Age of Onset  . Heart attack Mother   . Cancer Father     New Diagnoses (since last visit):   Family Support: Good  Lifestyle Diet: Breakfast: coffee, eggs, bacon Lunch: doesn't usually eat lunch Dinner: vegetables, pizza/pasta, doesn't eat fast food and rarely eats out Drinks: water, sometimes tea, milk   Exercise: walks every day for about 30 mins           Social History   Substance and Sexual Activity  Alcohol Use Yes  . Alcohol/week: 16.0 standard drinks  . Types: 14 Cans of beer, 2 Shots of liquor per week   Comment:  3-4  beers daily      Social History   Tobacco Use  Smoking Status Never Smoker  Smokeless Tobacco Never Used      Health Maintenance  Topic Date Due  . Hepatitis C Screening  01-07-1956  . HIV Screening  04/27/1969  . TETANUS/TDAP  04/27/1973  . COLONOSCOPY  04/27/2004  . FOOT EXAM  08/18/2017  . INFLUENZA VACCINE  12/09/2018 (Originally 04/20/2018)  . OPHTHALMOLOGY EXAM  12/09/2018  . HEMOGLOBIN A1C  12/13/2018   Outpatient Encounter Medications as of 08/10/2018  Medication Sig  .  enalapril (VASOTEC) 20 MG tablet Take 1 tablet (20 mg total) by mouth 2 (two) times daily.  Marland Kitchen. glipiZIDE (GLUCOTROL) 10 MG tablet TAKE ONE TABLET BY MOUTH EVERY DAY BEFORE BREAKFAST  . hydrALAZINE (APRESOLINE) 100 MG tablet Take 1 tablet (100 mg total) by mouth 2 (two) times daily.  . hydrochlorothiazide (HYDRODIURIL) 25 MG tablet Take 25 mg by mouth twice daily  . metoprolol succinate (TOPROL-XL) 25 MG 24 hr tablet Take 1 tablet (25 mg total) by mouth daily.   No facility-administered encounter medications on file as of 08/10/2018.    Health Maintenance/Date Completed  Last ED visit: several years Last Visit to PCP: 3 months ago Next Visit to PCP: December Specialist Visit: orthopedic surgeon - will next June for annual; had hip  replacement and knee arthroscopy this year Dental Exam: last week Eye Exam: 4 months ago Prostate Exam: never had Pelvic/PAP Exam: na Mammogram: na DEXA: na  Colonoscopy: never had Flu Vaccine: no Pneumonia Vaccine: no  Assessment and Plan: Adherence/Compliance: Pt reports missing medication about every other day. He reports it is not that he forgets, he just doesn't like to take medicine. Discussed the importance of his medications. Pt reports he feels fine with his health but will try to incorporate taking his medication more regularly.   HTN: enalapril 20mg , hydralazine 100mg , HCTZ 25mg , metoprolol succ 25mg  Pt's BP was within goal at 138/70 today. Discussed goal BP is <140/90. Pt states that his BP fluctuates and is sometimes high at some appointments. He reports he does not check it at home.  DM: glipizide 10mg  Diagnosed about 5 years ago. Well managed; 06-14-18 A1c: 7%, BG: 161  Lifestyle: Pt reports he rarely eats out and never has fast food. States he cooks vegetables frequently and sometimes pasta/pizza. Discussed cutting back on alcohol consumption to 1-2 beers daily. Pt has been able to increase exercise to walking everyday for about 30 minutes since having hip replacement and knee arthroscopy. Weight loss of about 10 lbs (previously 297 lbs, today 289 lbs). Encouraged pt to continue this and discussed benefits of daily exercise for his overall health.  Return to clinic in one year for annual MTM.  Angeline SlimKarissa Rue Tinnel, PharmD Candidate [employer]  Cosigned: Denice Paradisehristan Holt, PharmD, RPh [facility] [phone]

## 2018-08-11 ENCOUNTER — Other Ambulatory Visit: Payer: Self-pay

## 2018-09-21 ENCOUNTER — Other Ambulatory Visit: Payer: Self-pay

## 2018-09-26 ENCOUNTER — Other Ambulatory Visit: Payer: Self-pay

## 2018-09-26 DIAGNOSIS — N182 Chronic kidney disease, stage 2 (mild): Secondary | ICD-10-CM

## 2018-09-26 DIAGNOSIS — E1122 Type 2 diabetes mellitus with diabetic chronic kidney disease: Secondary | ICD-10-CM

## 2018-09-26 DIAGNOSIS — I872 Venous insufficiency (chronic) (peripheral): Secondary | ICD-10-CM

## 2018-09-26 DIAGNOSIS — E1159 Type 2 diabetes mellitus with other circulatory complications: Secondary | ICD-10-CM

## 2018-09-26 DIAGNOSIS — I152 Hypertension secondary to endocrine disorders: Secondary | ICD-10-CM

## 2018-09-26 DIAGNOSIS — I10 Essential (primary) hypertension: Principal | ICD-10-CM

## 2018-09-27 LAB — COMPREHENSIVE METABOLIC PANEL
ALK PHOS: 96 IU/L (ref 39–117)
ALT: 9 IU/L (ref 0–44)
AST: 10 IU/L (ref 0–40)
Albumin/Globulin Ratio: 1.6 (ref 1.2–2.2)
Albumin: 3.7 g/dL (ref 3.6–4.8)
BILIRUBIN TOTAL: 0.5 mg/dL (ref 0.0–1.2)
BUN / CREAT RATIO: 14 (ref 10–24)
BUN: 21 mg/dL (ref 8–27)
CHLORIDE: 103 mmol/L (ref 96–106)
CO2: 24 mmol/L (ref 20–29)
Calcium: 9.1 mg/dL (ref 8.6–10.2)
Creatinine, Ser: 1.46 mg/dL — ABNORMAL HIGH (ref 0.76–1.27)
GFR calc Af Amer: 58 mL/min/{1.73_m2} — ABNORMAL LOW (ref >59)
GFR calc non Af Amer: 50 mL/min/{1.73_m2} — ABNORMAL LOW (ref >59)
GLUCOSE: 145 mg/dL — AB (ref 65–99)
Globulin, Total: 2.3 g/dL (ref 1.5–4.5)
Potassium: 4.8 mmol/L (ref 3.5–5.2)
Sodium: 142 mmol/L (ref 134–144)
Total Protein: 6 g/dL (ref 6.0–8.5)

## 2018-09-27 LAB — TSH: TSH: 2.36 u[IU]/mL (ref 0.450–4.500)

## 2018-09-27 LAB — HEMOGLOBIN A1C
ESTIMATED AVERAGE GLUCOSE: 146 mg/dL
Hgb A1c MFr Bld: 6.7 % — ABNORMAL HIGH (ref 4.8–5.6)

## 2018-09-28 ENCOUNTER — Ambulatory Visit: Payer: Self-pay | Admitting: Adult Health

## 2018-09-28 ENCOUNTER — Encounter: Payer: Self-pay | Admitting: Adult Health

## 2018-09-28 VITALS — BP 160/90 | Temp 98.1°F | Ht 75.0 in | Wt 292.2 lb

## 2018-09-28 DIAGNOSIS — I872 Venous insufficiency (chronic) (peripheral): Secondary | ICD-10-CM

## 2018-09-28 DIAGNOSIS — Z9889 Other specified postprocedural states: Secondary | ICD-10-CM

## 2018-09-28 DIAGNOSIS — M1612 Unilateral primary osteoarthritis, left hip: Secondary | ICD-10-CM

## 2018-09-28 DIAGNOSIS — I10 Essential (primary) hypertension: Principal | ICD-10-CM

## 2018-09-28 DIAGNOSIS — N182 Chronic kidney disease, stage 2 (mild): Secondary | ICD-10-CM

## 2018-09-28 DIAGNOSIS — I152 Hypertension secondary to endocrine disorders: Secondary | ICD-10-CM

## 2018-09-28 DIAGNOSIS — I739 Peripheral vascular disease, unspecified: Secondary | ICD-10-CM

## 2018-09-28 DIAGNOSIS — E1122 Type 2 diabetes mellitus with diabetic chronic kidney disease: Secondary | ICD-10-CM

## 2018-09-28 DIAGNOSIS — E1159 Type 2 diabetes mellitus with other circulatory complications: Secondary | ICD-10-CM

## 2018-09-28 LAB — MICROALBUMIN / CREATININE URINE RATIO
Creatinine, Urine: 205.5 mg/dL
MICROALB/CREAT RATIO: 3159.2 mg/g{creat} — AB (ref 0.0–30.0)
Microalbumin, Urine: 6492.2 ug/mL

## 2018-09-28 MED ORDER — VALSARTAN-HYDROCHLOROTHIAZIDE 320-25 MG PO TABS: 1.0000 | ORAL_TABLET | Freq: Every day | ORAL | 3 refills | Status: DC

## 2018-09-28 MED ORDER — METOPROLOL SUCCINATE ER 25 MG PO TB24: 25.0000 mg | ORAL_TABLET | Freq: Every day | ORAL | 3 refills | Status: DC

## 2018-09-28 MED ORDER — GLIPIZIDE 10 MG PO TABS: ORAL_TABLET | ORAL | 3 refills | Status: DC

## 2018-09-28 MED ORDER — HYDRALAZINE HCL 100 MG PO TABS: 100.0000 mg | ORAL_TABLET | Freq: Every day | ORAL | 3 refills | Status: DC

## 2018-09-28 NOTE — Progress Notes (Signed)
Patient: Raymond Avery Male    DOB: 06-27-1956   63 y.o.   MRN: [id number] Visit Date: 09/28/2018  Today's Provider: Shawn Route, NP   Chief Complaint  Patient presents with  . Follow-up    Blood pressure and diabetes check.   Subjective:    HPI 63 y/o male who presents for f/u HTN, OA, CKD stage II and T2DM.  He offers no complaints.  He is still not taking his medications as prescribed.  He is not monitoring his blood glucose or his blood pressure at home.  He is still complaining of left knee pain as well as lower extremity swelling and ulceration of the left second toe.  His repeat labs show a decrease in his GFR from 52-50 and an increase in his creatinine from 1.4-1.46.  He reports normal urine output his urinalysis showed a microalbuminuria/ creatinine ratio greater than 300.  He denies chest pain palpitations nausea vomiting and dizziness.  He takes occasional Tylenol and Motrin for his knee pain.  He reports drinking beer regularly.   No Known Allergies Previous Medications   ENALAPRIL (VASOTEC) 20 MG TABLET    Take 1 tablet (20 mg total) by mouth 2 (two) times daily.   GLIPIZIDE (GLUCOTROL) 10 MG TABLET    TAKE ONE TABLET BY MOUTH EVERY DAY BEFORE BREAKFAST   HYDRALAZINE (APRESOLINE) 100 MG TABLET    Take 1 tablet (100 mg total) by mouth 2 (two) times daily.   HYDROCHLOROTHIAZIDE (HYDRODIURIL) 25 MG TABLET    Take 25 mg by mouth twice daily   METOPROLOL SUCCINATE (TOPROL-XL) 25 MG 24 HR TABLET    Take 1 tablet (25 mg total) by mouth daily.    Review of Systems  Constitutional: Negative.   Respiratory: Negative.   Cardiovascular: Positive for leg swelling. Negative for chest pain and palpitations.  Gastrointestinal: Negative.   Endocrine: Negative.   Genitourinary: Negative.   Musculoskeletal: Positive for arthralgias (left knee pain).  Skin: Positive for wound (left second toe).  Neurological: Negative.     Social History   Tobacco Use  . Smoking status:  Never Smoker  . Smokeless tobacco: Never Used  Substance Use Topics  . Alcohol use: Yes    Alcohol/week: 16.0 standard drinks    Types: 14 Cans of beer, 2 Shots of liquor per week    Comment:  3-4  beers daily   Objective:   BP (!) 160/90 (BP Location: Left Arm, Patient Position: Sitting, Cuff Size: Large)   Temp 98.1 F (36.7 C) (Oral)   Ht 6\' 3"  (1.905 m)   Wt 292 lb 3.2 oz (132.5 kg)   BMI 36.52 kg/m    Assessment & Plan:  1. Hypertension associated with diabetes (HCC) Uncontrolled due to medication nonadherence.  Will start patient on losartan and hydrochlorothiazide 100/25 daily.  He is to discontinue the hydrochlorothiazide and lisinopril.  He is to continue taking metoprolol 25 mg daily and hydralazine 25 mg daily both at bedtime.  I have encouraged patient to monitor his blood sugar pressure and keep readings.  2. Chronic venous insufficiency With chronic edema and lower extremity ulceration.  Now with a small open area on the left second toe.  Foot care reviewed extensively with patient.  He has been advised to return to the clinic if the wound on the second great toe gets worse.  He has been advised to also wear loose fitting shoes that are comfortable as well as compression socks.  He has been encouraged  to continue taking aspirin daily.  3. Type 2 diabetes mellitus with stage 2 chronic kidney disease, without long-term current use of insulin (HCC) Continue glipizide 10 mg daily.  Hemoglobin A1c currently at goal.  Continue weight loss efforts.  4. Unilateral primary osteoarthritis, left hip Continue over-the-counter Tylenol and avoid Motrin due to CKD  5. Morbid obesity (HCC) Weight loss exercises reviewed  6. S/P left knee arthroscopy Follow-up with Ortho as scheduled  7. PAD (peripheral artery disease) (HCC) Same as #2 above  8.  Alcohol abuse: Abstinence encouraged.  Shawn Route, NP   [facility]

## 2018-09-30 ENCOUNTER — Other Ambulatory Visit: Payer: Self-pay | Admitting: Adult Health

## 2018-09-30 ENCOUNTER — Encounter: Payer: Self-pay | Admitting: Adult Health

## 2018-09-30 MED ORDER — LOSARTAN POTASSIUM-HCTZ 100-25 MG PO TABS: 1.0000 | ORAL_TABLET | Freq: Every day | ORAL | 3 refills | Status: DC

## 2018-09-30 NOTE — Progress Notes (Signed)
Prescription changed per pharmacy recs

## 2018-11-02 ENCOUNTER — Ambulatory Visit: Payer: Self-pay | Admitting: Adult Health

## 2018-11-02 ENCOUNTER — Other Ambulatory Visit: Payer: Self-pay

## 2018-11-02 ENCOUNTER — Encounter: Payer: Self-pay | Admitting: Adult Health

## 2018-11-02 VITALS — BP 211/96 | HR 60 | Ht 75.5 in | Wt 288.0 lb

## 2018-11-02 NOTE — Progress Notes (Signed)
Patient left before he could be seen 

## 2018-11-02 NOTE — Progress Notes (Signed)
Patient left at 11:50am without being seen today.

## 2018-11-13 IMAGING — CR DG WRIST COMPLETE 3+V*L*
1 series · 5 of 5 positions shown · non-contrast
Comparison: None.

CLINICAL DATA: Patient slipped and fell in her kitchen
approximately 7 weeks ago with persistent wrist pain

EXAM:
LEFT WRIST - COMPLETE 3+ VIEW

[Series 1: dg wrist complete left · 0.14mm/px · 5 of 5 slices shown]
[im 1/5]
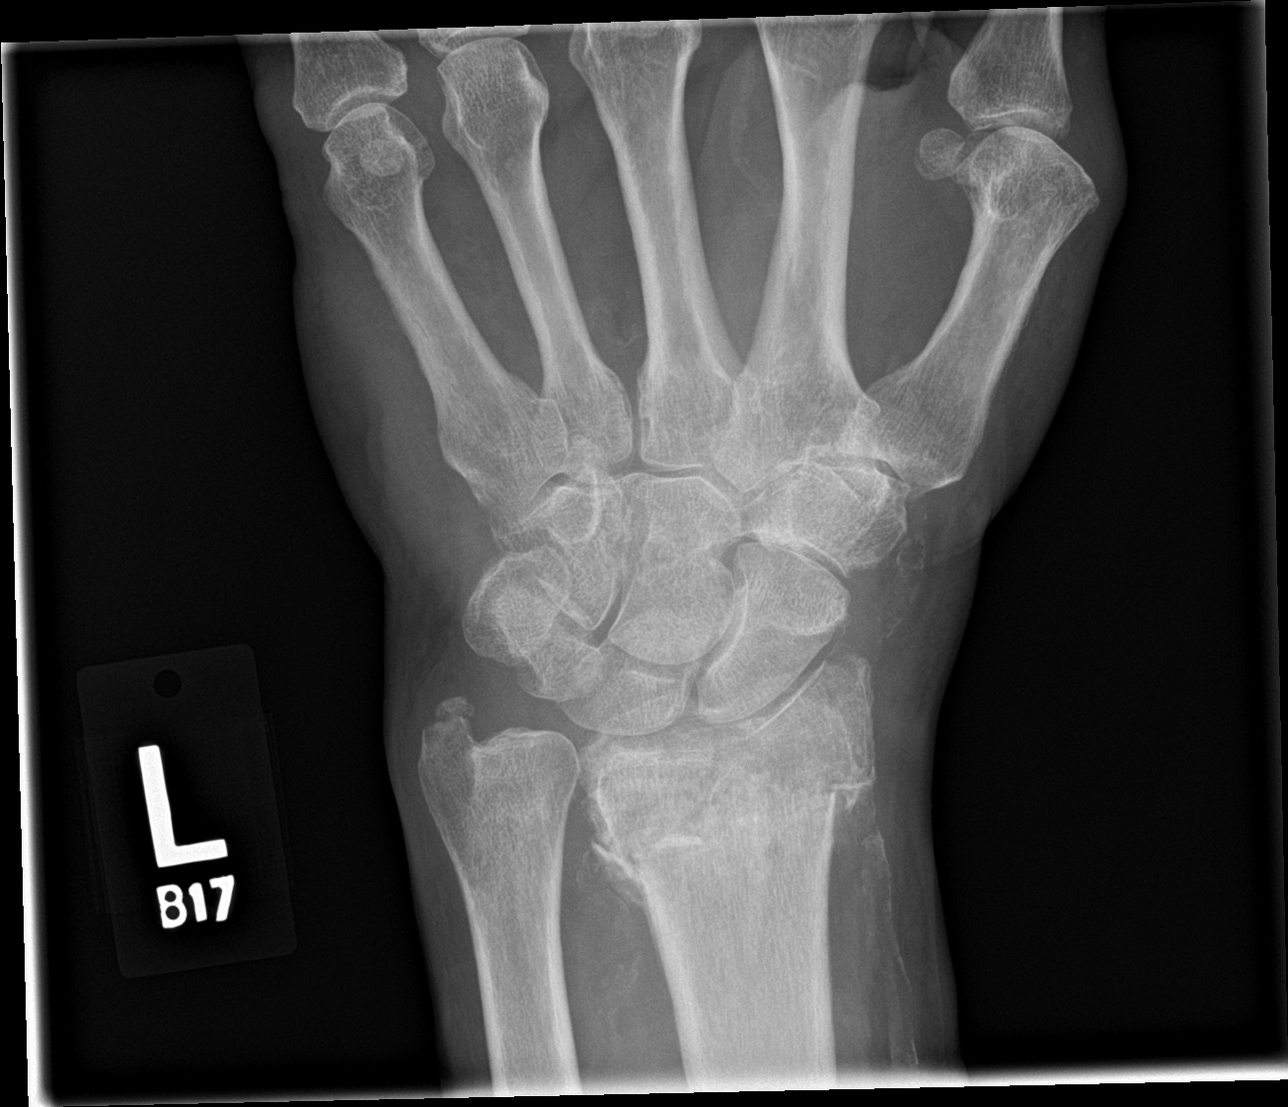
[im 2/5]
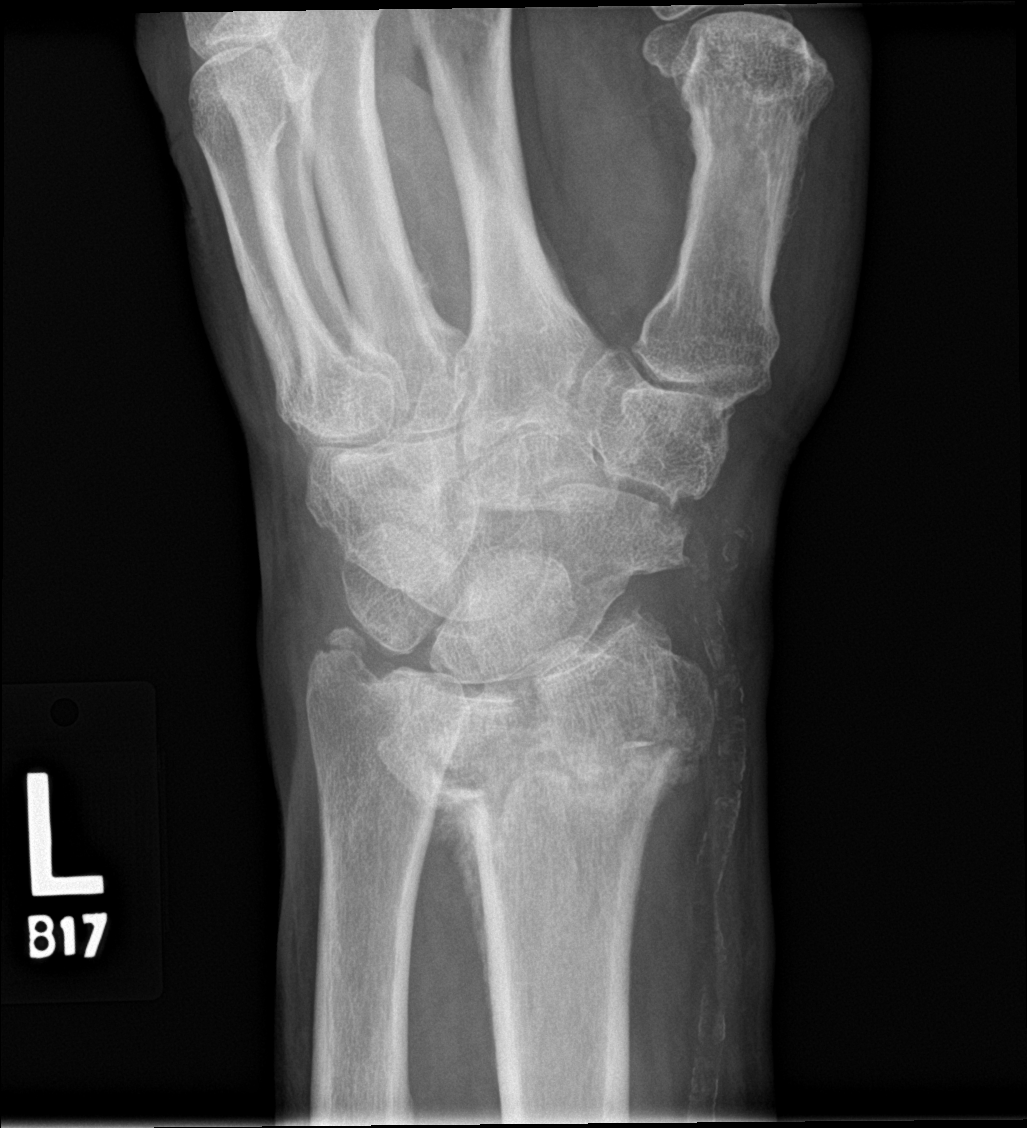
[im 3/5]
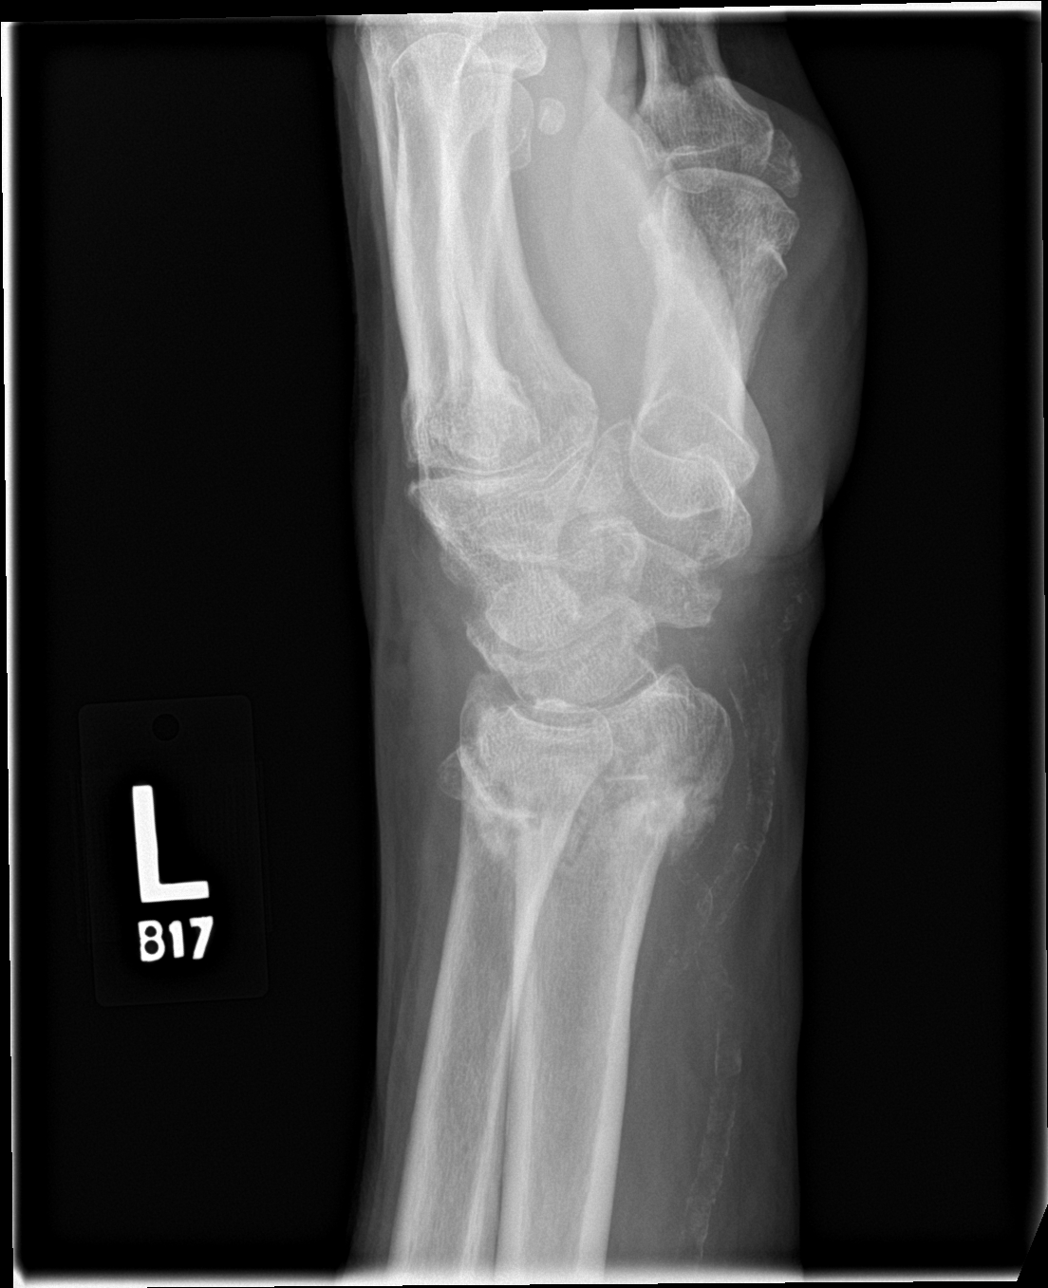
[im 4/5]
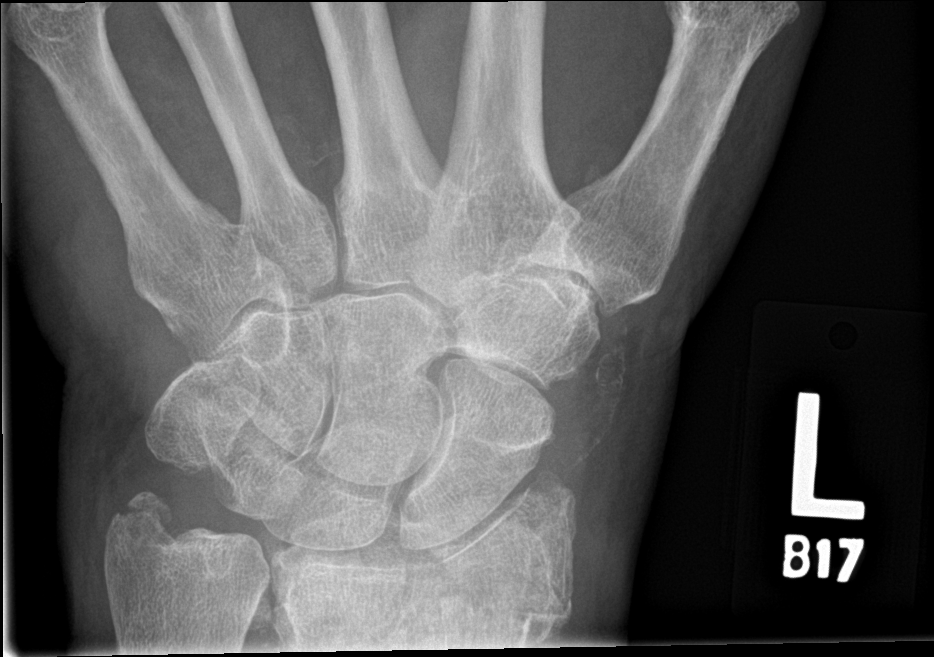
[im 5/5]
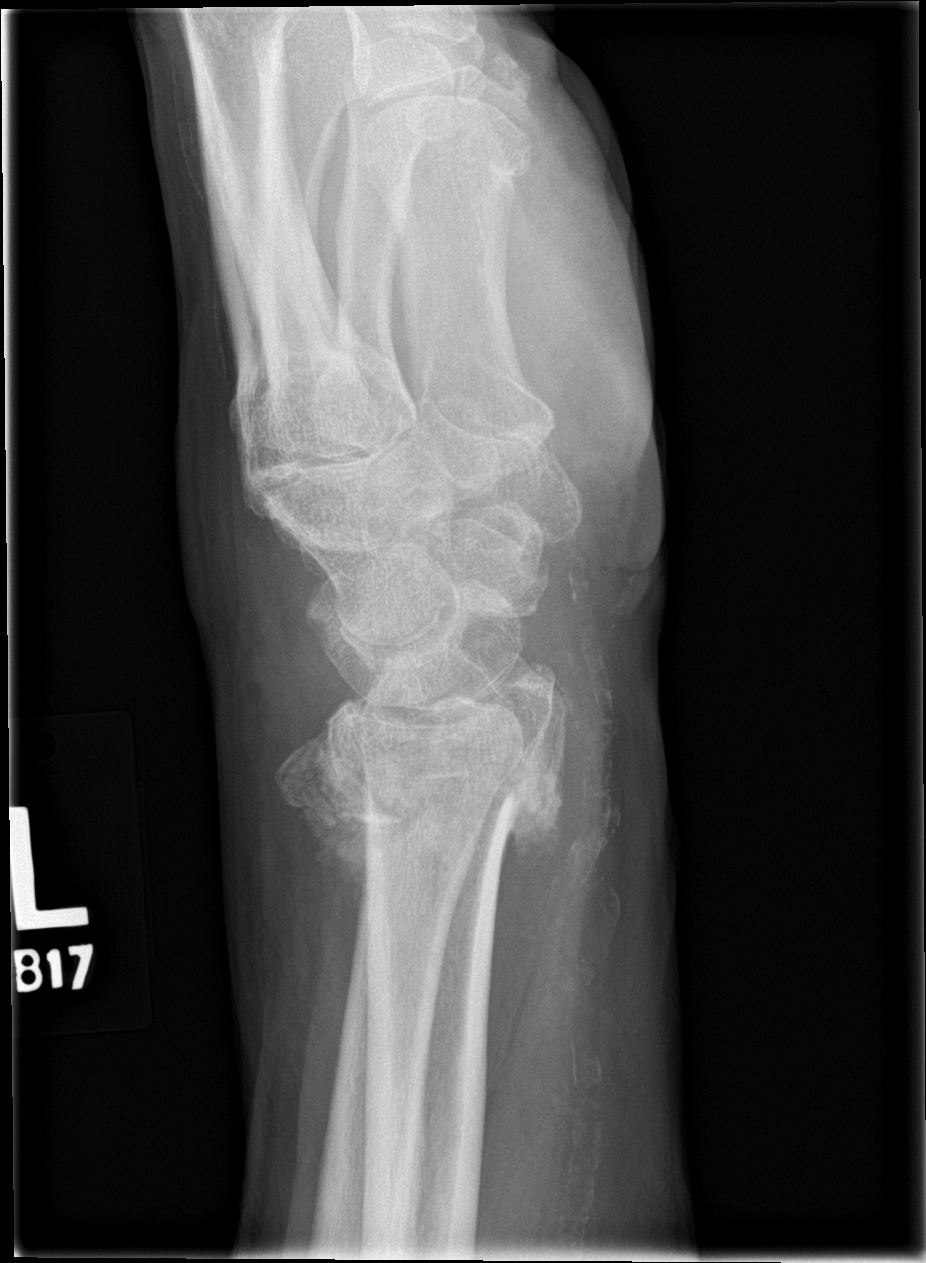

[5 of 5 positions shown; findings below may reference images not displayed]

FINDINGS: There is a suspected sub acute comminuted fracture minimally
displaced involving the distal epiphysis of the radius with
extension to both the radial carpal and distal radioulnar joints.
This finding is associated with a minimal amount of adjacent callus
formation.

Note is made of a minimally displaced fracture of the ulnar styloid
process. No evidence of chondrocalcinosis.

Joint spaces are preserved.  No dislocation.

There is diffuse soft tissue swelling about the wrist.

Vascular calcifications are noted about the wrist. No radiopaque
foreign body.
IMPRESSION: 1. Suspected subacute comminuted, minimally displaced fracture of
the distal radius with intra-articular extension.
2. Minimally displaced ulnar styloid process fracture.

## 2018-11-16 ENCOUNTER — Other Ambulatory Visit: Payer: Self-pay

## 2018-11-16 ENCOUNTER — Ambulatory Visit: Payer: Self-pay | Admitting: Gerontology

## 2018-11-16 ENCOUNTER — Ambulatory Visit: Payer: Self-pay | Admitting: Adult Health

## 2018-11-16 VITALS — BP 174/85 | HR 73 | Wt 286.8 lb

## 2018-11-16 DIAGNOSIS — N182 Chronic kidney disease, stage 2 (mild): Principal | ICD-10-CM

## 2018-11-16 DIAGNOSIS — E1122 Type 2 diabetes mellitus with diabetic chronic kidney disease: Secondary | ICD-10-CM

## 2018-11-16 DIAGNOSIS — I739 Peripheral vascular disease, unspecified: Secondary | ICD-10-CM

## 2018-11-16 DIAGNOSIS — M549 Dorsalgia, unspecified: Secondary | ICD-10-CM

## 2018-11-16 DIAGNOSIS — Z9889 Other specified postprocedural states: Secondary | ICD-10-CM

## 2018-11-16 DIAGNOSIS — I872 Venous insufficiency (chronic) (peripheral): Secondary | ICD-10-CM

## 2018-11-16 DIAGNOSIS — I10 Essential (primary) hypertension: Secondary | ICD-10-CM

## 2018-11-16 DIAGNOSIS — S3992XA Unspecified injury of lower back, initial encounter: Secondary | ICD-10-CM

## 2018-11-16 MED ORDER — ENALAPRIL MALEATE 20 MG PO TABS: 20.0000 mg | ORAL_TABLET | Freq: Every day | ORAL | 0 refills | Status: DC

## 2018-11-16 NOTE — Progress Notes (Signed)
Established Patient Office Visit  Subjective:  Patient ID: Raymond Avery, male    DOB: 1956-03-03  Age: 63 y.o. MRN: [id number]  CC:  Chief Complaint  Patient presents with  . Follow-up    diabetes    HPI Raymond Avery presents for follow up HTN,OA,CKD stage 11 and T2DM. He reports that his blood pressure is up because he's in pain and he has not taking his blood pressure medications today. He states that he slipped down 3 steps on his back during the snow last week.He reports that he continues to have intermittent non radiating 6/10 sharp pain to his lower back with movement.   He states that taking left over meloxicam that was prescribed for him after having hip surgery in 2019 relieves his pain .  HTN: He states that he takes hydralazine 100 mg bid, and enalapril 20 mg bid, metoprolol 25 mg daily, hydrochlorthiazide 25 mg daily and that he stopped taking 100 mg Losartan.  He states that he wants to take only 4 medications daily and he doesn't check his blood pressure at home. He denies chest pain, palpitation and shortness of breath. He states that he drinks 3-4 bottles of beer daily and denies the desire to cut back on his alcohol intake.  T2DM: He reports taking 10 mg glipizide daily and states that he will not check his blood glucose at home. He denies hypo/hyperglycemic symptoms. His blood glucose during office visit was 137 mg/dl. Otherwise he denies further concerns.   Past Medical History:  Diagnosis Date  . A-fib (HCC)    hx of 6-7 yrs , resolved no cardiologist  . Arrhythmia   . Arthritis   . CHF (congestive heart failure) (HCC)    hx of 15 yrs ago  . Diabetes (HCC)    type 2   . Hypertension   . Hyponatremia   . Leukocytosis   . Sleep apnea    dx. 10 years ago never wore mask  . Systemic inflammatory response syndrome (HCC)   . UTI (urinary tract infection)    hx of    Past Surgical History:  Procedure Laterality Date  . AMPUTATION TOE Left 06/10/2015    Procedure: Left great toe amputation ;  Surgeon: Linus Galas, MD;  Location: [facility];  Service: Podiatry;  Laterality: Left;and right  . IRRIGATION AND DEBRIDEMENT FOOT Right 08/18/2016   Procedure: IRRIGATION AND DEBRIDEMENT FOOT ABSCESS AND BONE;  Surgeon: Linus Galas, DPM;  Location: [facility];  Service: Podiatry;  Laterality: Right;  . JOINT REPLACEMENT     Left total hip  Dr. Magnus Ivan    02-10-18   . KNEE ARTHROSCOPY WITH MEDIAL MENISECTOMY Left 05/19/2018   Procedure: LEFT KNEE ARTHROSCOPY WITH CHONDROPLASTY, PARTIAL MENISCECTOMY;  Surgeon: Kathryne Hitch, MD;  Location: [facility];  Service: Orthopedics;  Laterality: Left;  . PENILE PROSTHESIS IMPLANT    . right great toe amputaion  before 2016  . TONSILLECTOMY    . TOTAL HIP ARTHROPLASTY Left 02/10/2018   Procedure: LEFT TOTAL HIP ARTHROPLASTY ANTERIOR APPROACH;  Surgeon: Kathryne Hitch, MD;  Location: [facility];  Service: Orthopedics;  Laterality: Left;    Family History  Problem Relation Age of Onset  . Heart attack Mother   . Cancer Father     Social History   Socioeconomic History  . Marital status: Single    Spouse name: Not on file  . Number of children: Not on file  . Years of education: Not on file  .  Highest education level: Not on file  Occupational History  . Not on file  Social Needs  . Financial resource strain: Not hard at all  . Food insecurity:    Worry: Never true    Inability: Never true  . Transportation needs:    Medical: No    Non-medical: No  Tobacco Use  . Smoking status: Never Smoker  . Smokeless tobacco: Never Used  Substance and Sexual Activity  . Alcohol use: Yes    Alcohol/week: 16.0 standard drinks    Types: 14 Cans of beer, 2 Shots of liquor per week    Comment:  3-4  beers daily  . Drug use: No  . Sexual activity: Yes    Partners: Female    Birth control/protection: None  Lifestyle  . Physical activity:    Days per week: 7 days    Minutes per session: 30 min  . Stress:  Not at all  Relationships  . Social connections:    Talks on phone: More than three times a week    Gets together: More than three times a week    Attends religious service: Never    Active member of club or organization: No    Attends meetings of clubs or organizations: Never    Relationship status: Not on file  . Intimate partner violence:    Fear of current or ex partner: No    Emotionally abused: No    Physically abused: No    Forced sexual activity: No  Other Topics Concern  . Not on file  Social History Narrative   Pt said things are going well and does not need any assistance or referrals. Thus, consent to release form was not signed.    Outpatient Medications Prior to Visit  Medication Sig Dispense Refill  . glipiZIDE (GLUCOTROL) 10 MG tablet TAKE ONE TABLET BY MOUTH EVERY DAY BEFORE BREAKFAST 90 tablet 3  . hydrALAZINE (APRESOLINE) 100 MG tablet Take 1 tablet (100 mg total) by mouth at bedtime. 180 tablet 3  . hydrochlorothiazide (HYDRODIURIL) 25 MG tablet Take 25 mg by mouth daily.    . metoprolol succinate (TOPROL-XL) 25 MG 24 hr tablet Take 1 tablet (25 mg total) by mouth daily. 90 tablet 3  . losartan (COZAAR) 100 MG tablet Take 100 mg by mouth daily.     No facility-administered medications prior to visit.     No Known Allergies  ROS Review of Systems  Constitutional: Negative.   HENT: Negative.   Eyes: Negative.   Respiratory: Negative.   Cardiovascular: Positive for leg swelling.  Endocrine: Negative.   Genitourinary: Negative.   Musculoskeletal: Positive for back pain (from fall) and gait problem (waddling gait).  Skin: Negative.   Psychiatric/Behavioral: Negative.       Objective:    Physical Exam  Constitutional: He is oriented to person, place, and time. He appears well-developed and well-nourished.  HENT:  Head: Normocephalic and atraumatic.  Eyes: Pupils are equal, round, and reactive to light. EOM are normal.  Neck: Normal range of motion.   Cardiovascular: Normal rate and regular rhythm.  Pulmonary/Chest: Effort normal and breath sounds normal.  Abdominal: Soft.  Genitourinary:    Genitourinary Comments: Deferred per patient   Musculoskeletal:        General: Edema (trace edema to left ankle) present.     Right shoulder: He exhibits no tenderness and no swelling.  Neurological: He is alert and oriented to person, place, and time.    BP Marland Kitchen)  174/85 (BP Location: Left Arm, Patient Position: Sitting)   Pulse 73   Wt 286 lb 12.8 oz (130.1 kg)   SpO2 97%   BMI 35.37 kg/m  Wt Readings from Last 3 Encounters:  11/16/18 286 lb 12.8 oz (130.1 kg)  11/02/18 288 lb (130.6 kg)  09/28/18 292 lb 3.2 oz (132.5 kg)   He was advised to start weight loss regimen.  Health Maintenance Due  Topic Date Due  . Hepatitis C Screening  03/14/1956  . HIV Screening  04/27/1969  . TETANUS/TDAP  04/27/1973  . COLONOSCOPY  04/27/2004  . FOOT EXAM  08/18/2017  . INFLUENZA VACCINE  04/20/2018      Lab Results  Component Value Date   TSH 2.360 09/26/2018   Lab Results  Component Value Date   WBC 8.1 06/14/2018   HGB 15.2 06/14/2018   HCT 45.8 06/14/2018   MCV 89 06/14/2018   PLT 260 06/14/2018   Lab Results  Component Value Date   NA 142 09/26/2018   K 4.8 09/26/2018   CO2 24 09/26/2018   GLUCOSE 145 (H) 09/26/2018   BUN 21 09/26/2018   CREATININE 1.46 (H) 09/26/2018   BILITOT 0.5 09/26/2018   ALKPHOS 96 09/26/2018   AST 10 09/26/2018   ALT 9 09/26/2018   PROT 6.0 09/26/2018   ALBUMIN 3.7 09/26/2018   CALCIUM 9.1 09/26/2018   ANIONGAP 8 05/12/2018   Lab Results  Component Value Date   CHOL 169 06/14/2018   Lab Results  Component Value Date   HDL 62 06/14/2018   Lab Results  Component Value Date   LDLCALC 88 06/14/2018   Lab Results  Component Value Date   TRIG 96 06/14/2018   Lab Results  Component Value Date   CHOLHDL 2.7 06/14/2018   Lab Results  Component Value Date   HGBA1C 6.7 (H) 09/26/2018       Assessment & Plan:     Meds ordered this encounter  Medications  . enalapril (VASOTEC) 20 MG tablet    Sig: Take 1 tablet (20 mg total) by mouth daily.    Dispense:  30 tablet    Refill:  0   1. Essential hypertension - Uncontrolled blood pressure due to non compliance with medication. He refused to take Losartan 100 mg. He agreed to take 100 mg hydralazine at bedtime, 25 mg hctz, 25 mg metoprolol and 20 mg enalapril ,until he checks the price for a combination of enalapril and hctz at other retail pharmacy since its expensive at [facility]. - enalapril (VASOTEC) 20 MG tablet; Take 1 tablet (20 mg total) by mouth daily.  Dispense: 30 tablet; Refill: 0  2. Type 2 diabetes mellitus with stage 2 chronic kidney disease, without long-term current use of insulin (HCC) - He will continue on 10 mg glipizide, blood glucose was 137 mg/dl and he was encouraged to continue to lose weight.  3. Morbid obesity (HCC) - He was encouraged to lose weight  4. S/P left knee arthroscopy - He states that he has Orthopedic appointment.   5. Chronic venous insufficiency - Open area to left second toe has healed up. Reviewed foot care with patient, he was encouraged to wear loose fitting shoes and compression stockings and continue to take aspirin daily.   6. Back pain due to injury - He was advised not to take left over Meloxicam and to continue on 1000 mg Tylenol every 8 hours as needed and notify provider for worsening pain.  Follow-up: Return  in about 2 weeks (around 11/30/2018), or if symptoms worsen or fail to improve.    Alexandru Moorer Trellis Paganini, NP

## 2018-11-30 ENCOUNTER — Ambulatory Visit: Payer: Self-pay | Admitting: Adult Health

## 2019-01-17 IMAGING — MR MR KNEE*L* W/O CM
6 series · 38 of 40 positions shown · non-contrast
Comparison: Radiographs from 10/27/2017

CLINICAL DATA: Progressive knee pain anteriorly

EXAM:
MRI OF THE LEFT KNEE WITHOUT CONTRAST
TECHNIQUE: Multiplanar, multisequence MR imaging of the knee was performed. No
intravenous contrast was administered.

[Series 4: T1 · coronal · 3.0mm · 0.62mm/px · 6 of 45 slices shown]
[im 1/45]
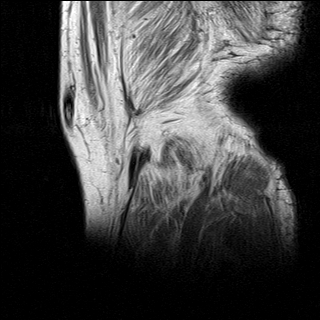
[im 7/45]
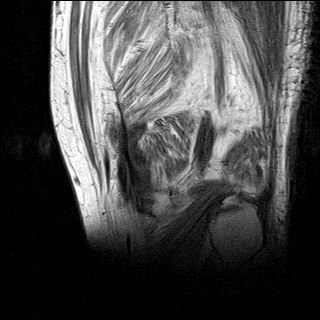
[im 13/45]
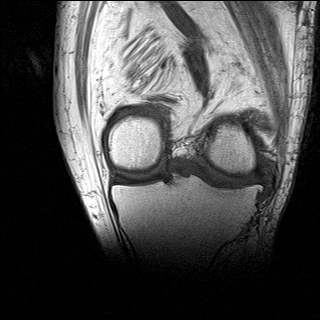
[im 19/45]
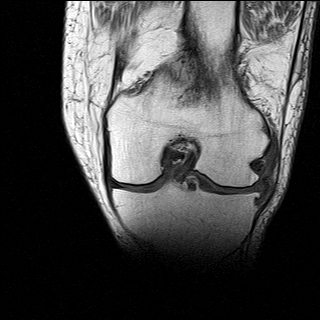
[im 26/45]
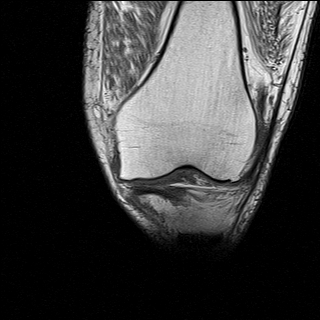
[im 32/45]
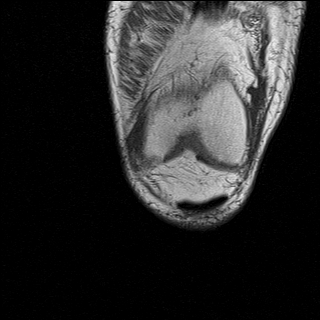

[Series 5: T2 fat-sat · coronal · 3.0mm · 0.62mm/px · 7 of 45 slices shown]
[im 1/45]
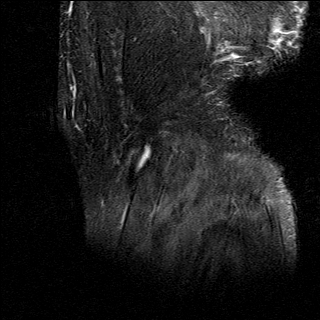
[im 8/45]
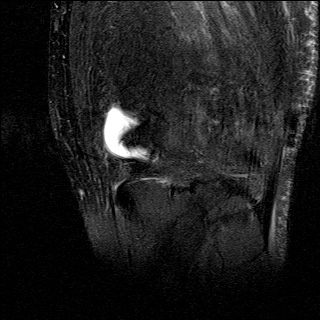
[im 15/45]
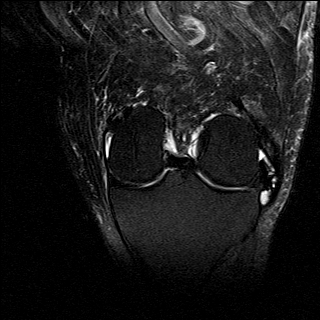
[im 23/45]
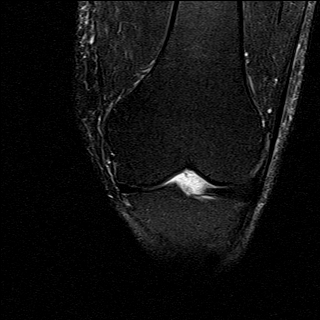
[im 30/45]
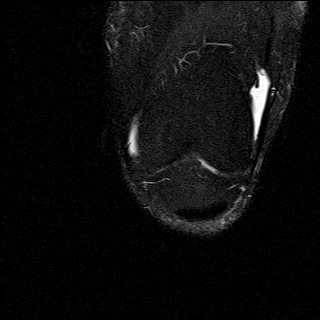
[im 37/45]
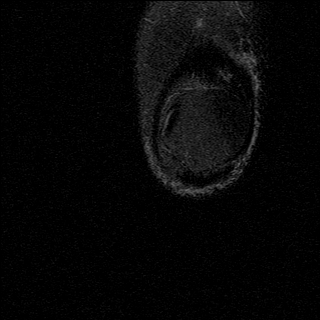
[im 45/45]
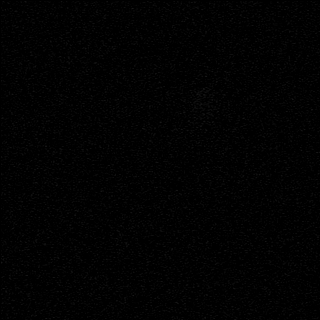

[Series 6: PD fat-sat · sagittal · 3.0mm · 0.78mm/px · 7 of 40 slices shown (1 of 4)]
[im 1/40]
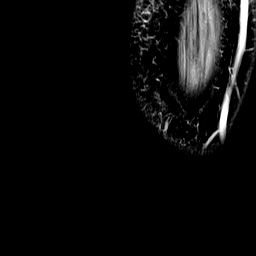
[im 7/40]
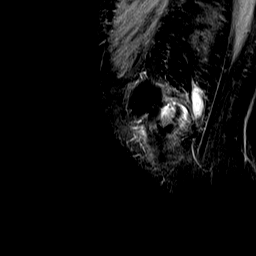
[im 14/40]
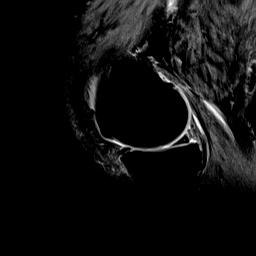
[im 20/40]
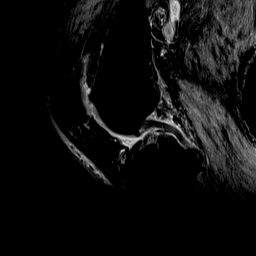
[im 27/40]
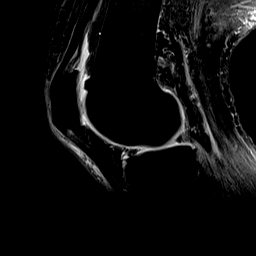
[im 33/40]
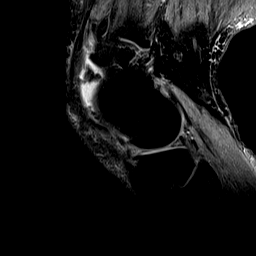
[im 40/40]
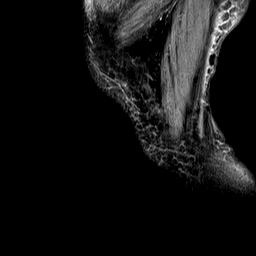

[Series 7: PD fat-sat · coronal · 3.0mm · 0.78mm/px · 7 of 45 slices shown (2 of 4)]
[im 1/45]
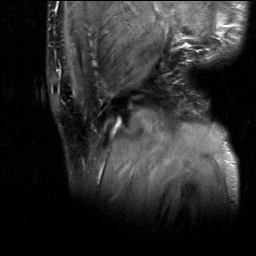
[im 8/45]
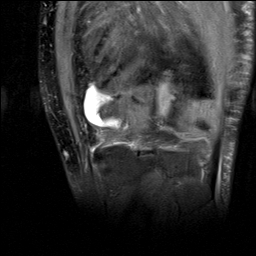
[im 15/45]
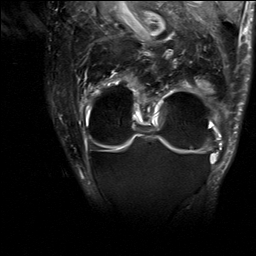
[im 23/45]
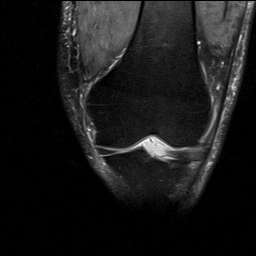
[im 30/45]
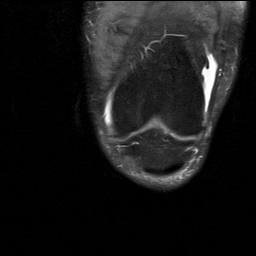
[im 37/45]
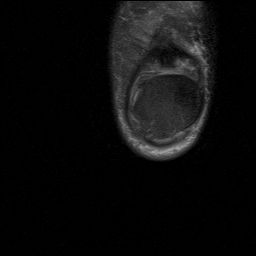
[im 45/45]
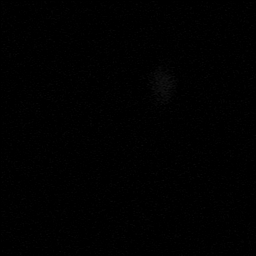

[Series 8: PD fat-sat · coronal · 2.0mm · 0.62mm/px · 3 of 19 slices shown (3 of 4)]
[im 1/19]
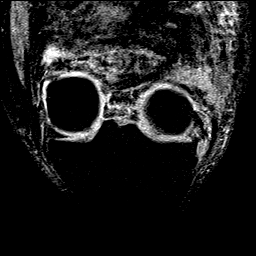
[im 10/19]
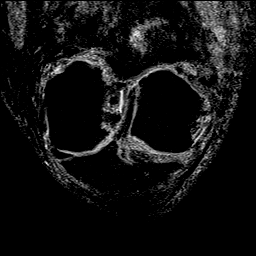
[im 19/19]
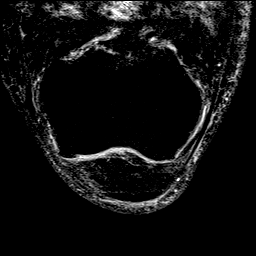

[Series 9: PD fat-sat · axial · 3.0mm · 0.33mm/px · z∈[-110,+51]mm · 8 of 50 slices shown (4 of 4)]
[im 1/50]
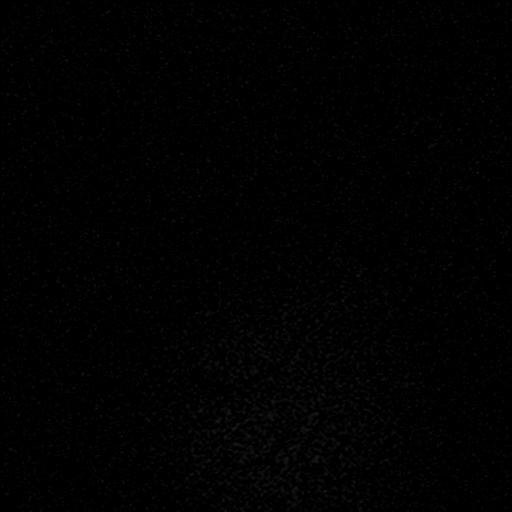
[im 8/50]
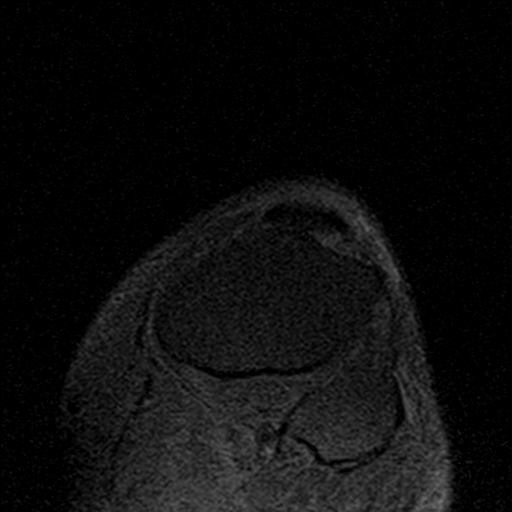
[im 15/50]
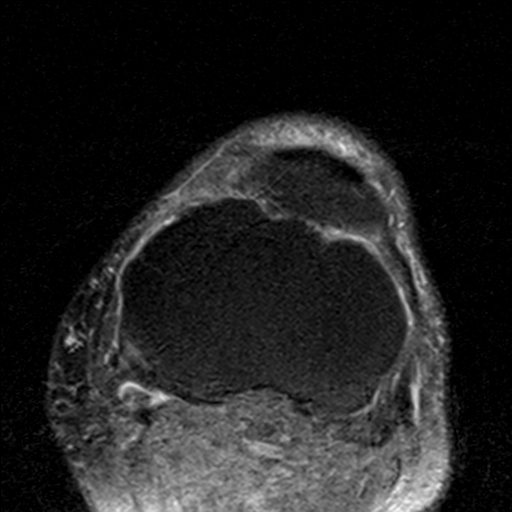
[im 22/50]
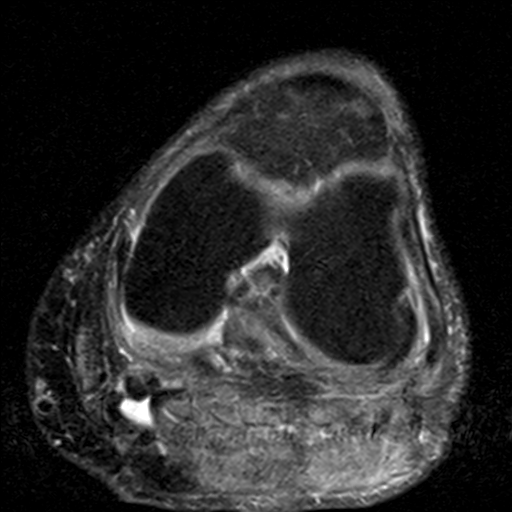
[im 29/50]
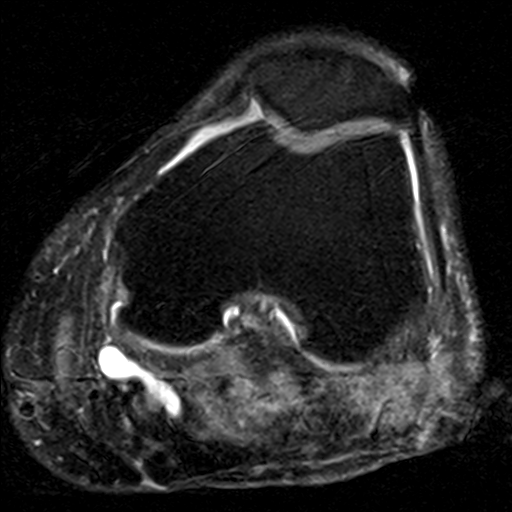
[im 36/50]
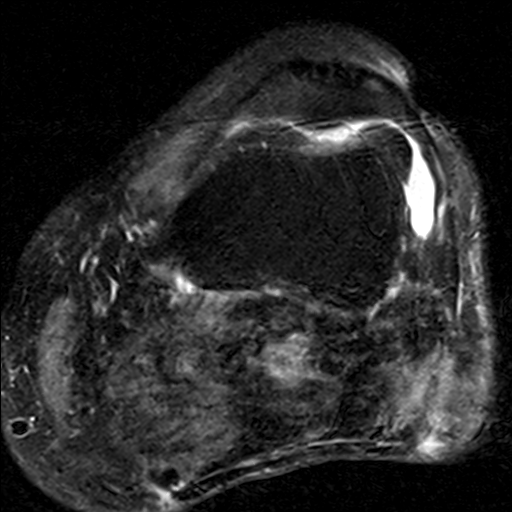
[im 43/50]
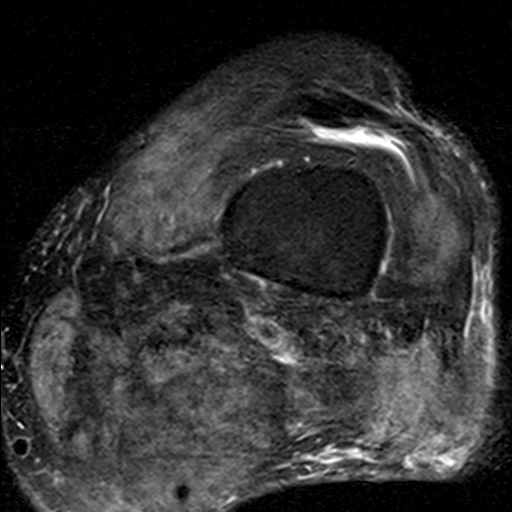
[im 50/50]
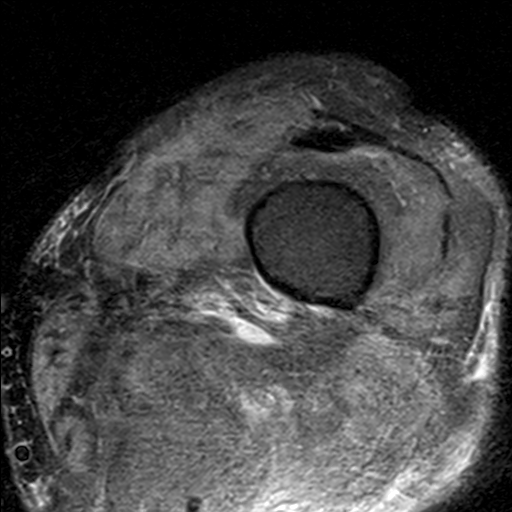

[38 of 40 positions shown; findings below may reference images not displayed]

FINDINGS: Despite efforts by the technologist and patient, motion artifact is
present on today's exam and could not be eliminated. This reduces
exam sensitivity and specificity.

MENISCI

Medial meniscus: Grade 1 degenerative signal in the meniscus without
surface extension.

Lateral meniscus: Indistinct and likely degenerative tearing of the
anterior horn and midbody with a small amount of involvement of the
posterior horn adjacent to the midbody. Grade 1 signal in the rest
of the posterior horn. Poor definition of the posterior root.
Klever ligament noted.

LIGAMENTS

Cruciates:  Unremarkable

Collaterals:  Unremarkable

CARTILAGE

Patellofemoral:  Mild degenerative chondral thinning.

Medial:  Moderate degenerative chondral thinning.

Lateral: Moderate to prominent degenerative chondral thinning with
marginal spurring. Small subcortical focus of edema posteriorly
along the lateral femoral condyle.

Joint:  Small knee effusion.

Popliteal Fossa: Small to moderate size Baker's cyst containing a
1.1 by 1.1 by 0.6 cm low T2 signal filling defect favoring free
osteochondral fragment, and an adjacent 5 mm filling defect.
Synovitis or PVNS is considered possible but a less likely
differential diagnostic consideration despite the lack of obvious
calcification in this vicinity on the recent radiograph.

Extensor Mechanism:  Prepatellar subcutaneous edema.

Bones:  Tibial spine and intercondylar notch spurring.

Other: Subcutaneous edema noted.  Regional muscular atrophy.
IMPRESSION: 1. Degenerative tearing of the anterior horn, midbody, and portion
of the posterior horn lateral meniscus.
2. Moderate osteoarthritis, most striking in the lateral
compartment.
3. A small to moderate size Baker's cyst has several filling defects
resembling free osteochondral fragments, less likely to be synovitis
or PVNS.
4. Small knee effusion.
5. Prepatellar subcutaneous edema.
6. Regional muscular atrophy.

## 2019-01-25 ENCOUNTER — Ambulatory Visit: Payer: Self-pay | Admitting: Orthopaedic Surgery

## 2019-01-29 ENCOUNTER — Other Ambulatory Visit: Payer: Self-pay

## 2019-01-29 DIAGNOSIS — I10 Essential (primary) hypertension: Secondary | ICD-10-CM

## 2019-01-29 MED ORDER — HYDROCHLOROTHIAZIDE 25 MG PO TABS: 25.0000 mg | ORAL_TABLET | Freq: Every day | ORAL | 3 refills | Status: DC

## 2019-01-29 MED ORDER — METOPROLOL SUCCINATE ER 25 MG PO TB24: 25.0000 mg | ORAL_TABLET | Freq: Every day | ORAL | 3 refills | Status: DC

## 2019-01-30 ENCOUNTER — Other Ambulatory Visit: Payer: Self-pay | Admitting: Gerontology

## 2019-01-30 DIAGNOSIS — I10 Essential (primary) hypertension: Secondary | ICD-10-CM

## 2019-03-14 IMAGING — DX DG PORTABLE PELVIS
1 series · 1 of 1 positions shown · non-contrast
Comparison: Radiographs January 30, 2018.

CLINICAL DATA: Status post left total hip replacement.

EXAM:
PORTABLE PELVIS 1-2 VIEWS

[pelvis ap]
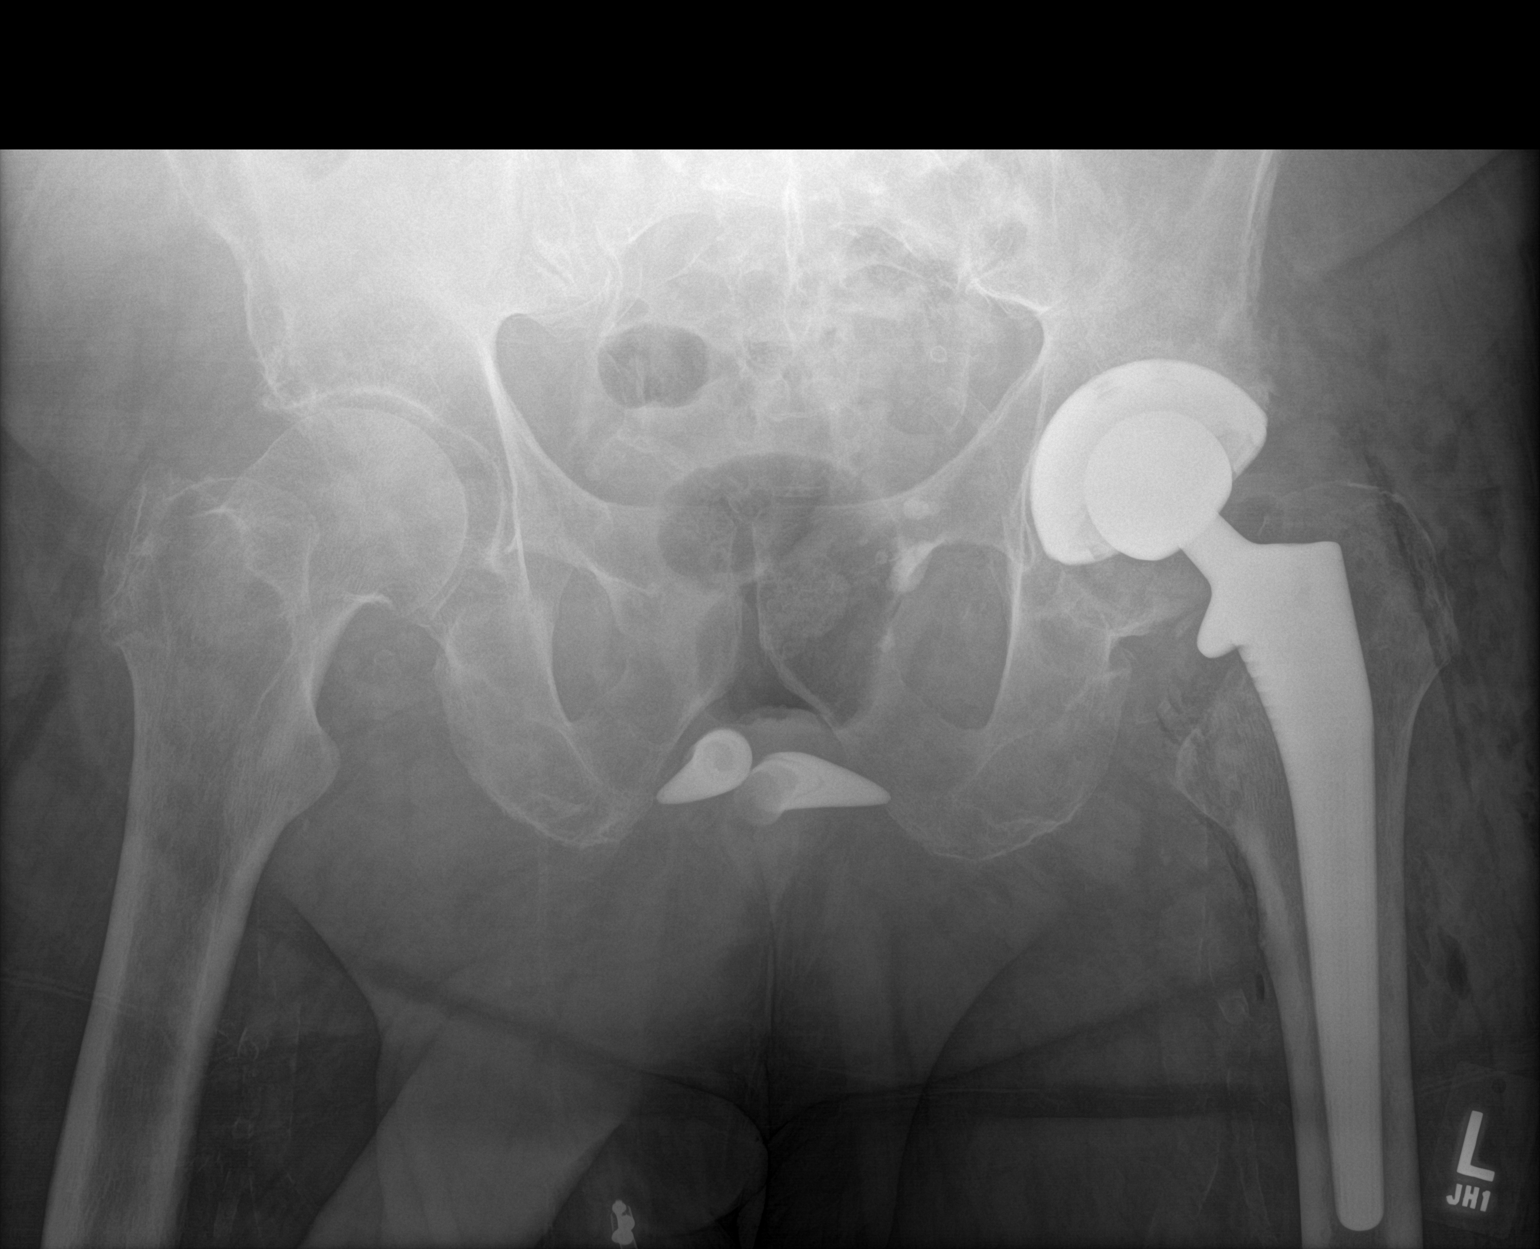

[1 of 1 positions shown; findings below may reference images not displayed]

FINDINGS: The femoral and acetabular components appear to be well situated. No
fracture or dislocation is noted. Expected postoperative changes are
seen in the surrounding soft tissues.
IMPRESSION: Status post left total hip arthroplasty.

## 2019-06-19 ENCOUNTER — Telehealth: Payer: Self-pay | Admitting: Adult Health

## 2019-06-19 NOTE — Telephone Encounter (Signed)
Left vm on 9/29 @2 :17 pm regarding scheduling f/u appointment and updating eligibility.

## 2019-06-20 ENCOUNTER — Telehealth: Payer: Self-pay

## 2019-06-20 NOTE — Telephone Encounter (Signed)
Called pt on 9/30 at 3:04pm and left vm about scheduling a fu appt

## 2019-07-05 ENCOUNTER — Ambulatory Visit: Payer: Self-pay | Admitting: Adult Health

## 2019-07-05 ENCOUNTER — Encounter: Payer: Self-pay | Admitting: Adult Health

## 2019-07-05 ENCOUNTER — Other Ambulatory Visit: Payer: Self-pay

## 2019-07-05 VITALS — Wt 280.0 lb

## 2019-07-05 DIAGNOSIS — R21 Rash and other nonspecific skin eruption: Secondary | ICD-10-CM

## 2019-07-05 DIAGNOSIS — E1159 Type 2 diabetes mellitus with other circulatory complications: Secondary | ICD-10-CM

## 2019-07-05 DIAGNOSIS — I152 Hypertension secondary to endocrine disorders: Secondary | ICD-10-CM

## 2019-07-05 DIAGNOSIS — F1029 Alcohol dependence with unspecified alcohol-induced disorder: Secondary | ICD-10-CM

## 2019-07-05 DIAGNOSIS — E1122 Type 2 diabetes mellitus with diabetic chronic kidney disease: Secondary | ICD-10-CM

## 2019-07-05 DIAGNOSIS — Z9889 Other specified postprocedural states: Secondary | ICD-10-CM

## 2019-07-05 DIAGNOSIS — N182 Chronic kidney disease, stage 2 (mild): Secondary | ICD-10-CM

## 2019-07-05 MED ORDER — HYDROCHLOROTHIAZIDE 25 MG PO TABS: 25.0000 mg | ORAL_TABLET | Freq: Every day | ORAL | 3 refills | Status: AC

## 2019-07-05 MED ORDER — METOPROLOL SUCCINATE ER 25 MG PO TB24: 25.0000 mg | ORAL_TABLET | Freq: Every day | ORAL | 3 refills | Status: AC

## 2019-07-05 MED ORDER — HYDROCORTISONE 1 % EX OINT: 1.0000 "application " | TOPICAL_OINTMENT | Freq: Two times a day (BID) | CUTANEOUS | 0 refills | Status: AC

## 2019-07-05 MED ORDER — ENALAPRIL MALEATE 20 MG PO TABS: 20.0000 mg | ORAL_TABLET | Freq: Every day | ORAL | 3 refills | Status: AC

## 2019-07-05 MED ORDER — HYDRALAZINE HCL 100 MG PO TABS: 100.0000 mg | ORAL_TABLET | Freq: Every day | ORAL | 3 refills | Status: AC

## 2019-07-05 MED ORDER — GLIPIZIDE 10 MG PO TABS: ORAL_TABLET | ORAL | 3 refills | Status: AC

## 2019-07-05 NOTE — Progress Notes (Signed)
Patient: Raymond Avery Male    DOB: 10/13/1955   63 y.o.   MRN: [id number] Visit Date: 07/05/2019  Patient consents to Telephone/or Telehealth visit and two identifiers have been used to establish patient's identity prior to visit  Today's Provider: Deforest Hoyles, NP   Chief Complaint  Patient presents with  . Hypertension  . Diabetes   Subjective:    HPI This is a 63 y/o male seen for a routine f/u visit via telephonic visit. He offers no complaints. States that he has ran out of all his medications. He is now eligible for MEDICARE hence this is his last visit with [place]. He does not check his blood pressure or blood sugars at home. He has not taken any of his medications in over a month. He denies any symptoms suggestive of TIA, hyperglycemia or hypoglycemia. He reports a new rash on his left calf. Rash is erythematous and weeps at times but doesn't itch. He denies chest pain, palpitations, dizziness and headache. He is moving out of the area soon. Last labs were in January 2020. Overall, patient has not ben very compliant with treatment recommendations. His hip pain is better since he had the hip replacement surgery. He reports loosing some weight and exercising daily. He continues to drink daily.  No Known Allergies Previous Medications   ENALAPRIL (VASOTEC) 20 MG TABLET    TAKE ONE TABLET BY MOUTH EVERY DAY   GLIPIZIDE (GLUCOTROL) 10 MG TABLET    TAKE ONE TABLET BY MOUTH EVERY DAY BEFORE BREAKFAST   HYDRALAZINE (APRESOLINE) 100 MG TABLET    Take 1 tablet (100 mg total) by mouth at bedtime.   HYDROCHLOROTHIAZIDE (HYDRODIURIL) 25 MG TABLET    Take 1 tablet (25 mg total) by mouth daily.   METOPROLOL SUCCINATE (TOPROL-XL) 25 MG 24 HR TABLET    Take 1 tablet (25 mg total) by mouth daily.    Review of Systems  Constitutional: Negative.   HENT: Negative.   Eyes: Negative.   Respiratory: Negative.   Cardiovascular: Positive for leg swelling. Negative for chest pain and  palpitations.  Gastrointestinal: Negative.   Endocrine: Negative.   Genitourinary: Negative.   Musculoskeletal: Positive for arthralgias and back pain.  Skin: Positive for rash.  Neurological: Negative for dizziness, weakness and headaches.  Hematological: Negative.   Psychiatric/Behavioral: Negative.     Social History   Tobacco Use  . Smoking status: Never Smoker  . Smokeless tobacco: Never Used  Substance Use Topics  . Alcohol use: Yes    Alcohol/week: 16.0 standard drinks    Types: 14 Cans of beer, 2 Shots of liquor per week    Comment:  3-4  beers daily   Objective:   Wt 280 lb (127 kg)   BMI 34.54 kg/m   Physical Exam  Unable to complete as this is a telephonic visit    Assessment & Plan:  1. Hypertension associated with type 2 diabetes mellitus ([place]) Medication and f/u adherence reinforced and refills provided - metoprolol succinate (TOPROL-XL) 25 MG 24 hr tablet; Take 1 tablet (25 mg total) by mouth daily.  Dispense: 90 tablet; Refill: 3 - hydrochlorothiazide (HYDRODIURIL) 25 MG tablet; Take 1 tablet (25 mg total) by mouth daily.  Dispense: 90 tablet; Refill: 3 - enalapril (VASOTEC) 20 MG tablet; Take 1 tablet (20 mg total) by mouth daily.  Dispense: 90 tablet; Refill: 3  2. Type 2 diabetes mellitus with stage 2 chronic kidney disease, without long-term current use of insulin ([place]) Will repeat  hga1c. Continue current medications - CBC w/Diff; Future - Comp Met (CMET); Future - HgB A1c; Future - Lipid Profile; Future  3. Morbid obesity ([place]) Continue weight loss efforts  4. S/P left knee arthroscopy Pain is well controlled. F/U with Ortho prn  5. Rash and nonspecific skin eruption Hydrocortisone cream to affected areas bid until rash resolves - CBC w/Diff; Future  6. Alcohol dependence with unspecified alcohol-induced disorder [facility]) Patient counseled  Deforest Hoyles, NP   [facility]

## 2019-07-05 NOTE — Progress Notes (Signed)
Patient will be getting Medicare he is 26.  This will be his last visit.  He wants to get all his prescriptions renewed.

## 2019-07-18 ENCOUNTER — Other Ambulatory Visit: Payer: Self-pay

## 2019-07-18 DIAGNOSIS — R21 Rash and other nonspecific skin eruption: Secondary | ICD-10-CM

## 2019-07-18 DIAGNOSIS — N182 Chronic kidney disease, stage 2 (mild): Secondary | ICD-10-CM

## 2019-07-18 DIAGNOSIS — E1122 Type 2 diabetes mellitus with diabetic chronic kidney disease: Secondary | ICD-10-CM

## 2019-07-19 LAB — CBC WITH DIFFERENTIAL/PLATELET
Basophils Absolute: 0.1 10*3/uL (ref 0.0–0.2)
Basos: 1 %
EOS (ABSOLUTE): 0.1 10*3/uL (ref 0.0–0.4)
Eos: 1 %
Hematocrit: 50.6 % (ref 37.5–51.0)
Hemoglobin: 16.7 g/dL (ref 13.0–17.7)
Immature Grans (Abs): 0 10*3/uL (ref 0.0–0.1)
Immature Granulocytes: 1 %
Lymphocytes Absolute: 1.3 10*3/uL (ref 0.7–3.1)
Lymphs: 16 %
MCH: 29.3 pg (ref 26.6–33.0)
MCHC: 33 g/dL (ref 31.5–35.7)
MCV: 89 fL (ref 79–97)
Monocytes Absolute: 0.8 10*3/uL (ref 0.1–0.9)
Monocytes: 9 %
Neutrophils Absolute: 6.2 10*3/uL (ref 1.4–7.0)
Neutrophils: 72 %
Platelets: 229 10*3/uL (ref 150–450)
RBC: 5.69 x10E6/uL (ref 4.14–5.80)
RDW: 13.4 % (ref 11.6–15.4)
WBC: 8.5 10*3/uL (ref 3.4–10.8)

## 2019-07-19 LAB — COMPREHENSIVE METABOLIC PANEL
ALT: 13 IU/L (ref 0–44)
AST: 12 IU/L (ref 0–40)
Albumin/Globulin Ratio: 1.2 (ref 1.2–2.2)
Albumin: 3.3 g/dL — ABNORMAL LOW (ref 3.8–4.8)
Alkaline Phosphatase: 149 IU/L — ABNORMAL HIGH (ref 39–117)
BUN/Creatinine Ratio: 12 (ref 10–24)
BUN: 20 mg/dL (ref 8–27)
Bilirubin Total: 1 mg/dL (ref 0.0–1.2)
CO2: 22 mmol/L (ref 20–29)
Calcium: 9.1 mg/dL (ref 8.6–10.2)
Chloride: 102 mmol/L (ref 96–106)
Creatinine, Ser: 1.61 mg/dL — ABNORMAL HIGH (ref 0.76–1.27)
GFR calc Af Amer: 51 mL/min/{1.73_m2} — ABNORMAL LOW (ref >59)
GFR calc non Af Amer: 44 mL/min/{1.73_m2} — ABNORMAL LOW (ref >59)
Globulin, Total: 2.7 g/dL (ref 1.5–4.5)
Glucose: 200 mg/dL — ABNORMAL HIGH (ref 65–99)
Potassium: 4.2 mmol/L (ref 3.5–5.2)
Sodium: 136 mmol/L (ref 134–144)
Total Protein: 6 g/dL (ref 6.0–8.5)

## 2019-07-19 LAB — LIPID PANEL
Chol/HDL Ratio: 2.4 ratio (ref 0.0–5.0)
Cholesterol, Total: 171 mg/dL (ref 100–199)
HDL: 72 mg/dL (ref >39)
LDL Chol Calc (NIH): 81 mg/dL (ref 0–99)
Triglycerides: 102 mg/dL (ref 0–149)
VLDL Cholesterol Cal: 18 mg/dL (ref 5–40)

## 2019-07-19 LAB — HEMOGLOBIN A1C
Est. average glucose Bld gHb Est-mCnc: 160 mg/dL
Hgb A1c MFr Bld: 7.2 % — ABNORMAL HIGH (ref 4.8–5.6)

## 2019-07-26 ENCOUNTER — Other Ambulatory Visit: Payer: Self-pay

## 2019-07-26 ENCOUNTER — Ambulatory Visit: Payer: Self-pay | Admitting: Adult Health

## 2019-07-26 DIAGNOSIS — N182 Chronic kidney disease, stage 2 (mild): Secondary | ICD-10-CM

## 2019-07-26 DIAGNOSIS — E1122 Type 2 diabetes mellitus with diabetic chronic kidney disease: Secondary | ICD-10-CM

## 2019-07-26 NOTE — Progress Notes (Signed)
  Patient: Raymond Avery Male    DOB: 1956-05-04   63 y.o.   MRN: [id number] Visit Date: 07/26/2019 Patient consents to Telephone/or Telehealth visit and two identifiers have been used to establish patient's identity prior to visit  Today's Provider: Deforest Hoyles, NP   Chief Complaint  Patient presents with  . Hypertension   Subjective:    HPI  Patient is seen for f/u labs. His HgA1c is up to 7.2% from 6.75 previously,  his GFR is down to 51 from 58 and his creatinine is 1.6 up from 1.4.  He states that this is because he went without his medications for 3 months.  However at the clinic continue to see patients virtually and telephonically so there was no reason why he should have gone without his medications for so long.  Lipid panel was otherwise unremarkable. Today he offers no specific complaints.  He states that he had picked up his prescriptions and he is taking them as prescribed.  He is still not monitoring his blood pressure at home and sporadically monitors his blood sugars. No Known Allergies Previous Medications   ENALAPRIL (VASOTEC) 20 MG TABLET    Take 1 tablet (20 mg total) by mouth daily.   GLIPIZIDE (GLUCOTROL) 10 MG TABLET    TAKE ONE TABLET BY MOUTH EVERY DAY BEFORE BREAKFAST   HYDRALAZINE (APRESOLINE) 100 MG TABLET    Take 1 tablet (100 mg total) by mouth at bedtime.   HYDROCHLOROTHIAZIDE (HYDRODIURIL) 25 MG TABLET    Take 1 tablet (25 mg total) by mouth daily.   HYDROCORTISONE 1 % OINTMENT    Apply 1 application topically 2 (two) times daily. Apply to rash on left calf   METOPROLOL SUCCINATE (TOPROL-XL) 25 MG 24 HR TABLET    Take 1 tablet (25 mg total) by mouth daily.    Review of Systems  Constitutional: Negative.   Respiratory: Negative.   Cardiovascular: Negative.   Gastrointestinal: Negative.   Endocrine: Negative.   Skin: Negative.   Neurological: Negative.     Social History   Tobacco Use  . Smoking status: Never Smoker  . Smokeless tobacco:  Never Used  Substance Use Topics  . Alcohol use: Yes    Alcohol/week: 16.0 standard drinks    Types: 14 Cans of beer, 2 Shots of liquor per week    Comment:  3-4  beers daily   Objective:   There were no vitals taken for this visit.  Physical Exam Unable to obtain as this is a telephonic visit    Assessment & Plan:  1. Type 2 diabetes mellitus with stage 2 chronic kidney disease, without long-term current use of insulin (HCC) Interval worsening in kidney function and hemoglobin A1c; likely due to nonadherence to medications.  Need for medication adherence reviewed at length with patient.  He has promised to continue taking his medications as prescribed and to notify his prescriber or pharmacist when he has only 1 month supply left.  2. Morbid obesity ([place]) Continue weight loss efforts.  Lipid panel normal  Patient is now eligible for Medicaid and this will be his last visit.  He already has 3 months worth of refills provided.  Deforest Hoyles, NP   [facility] [place]

## 2019-07-29 ENCOUNTER — Encounter: Payer: Self-pay | Admitting: Adult Health

## 2020-08-02 DIAGNOSIS — S63617A Unspecified sprain of left little finger, initial encounter: Secondary | ICD-10-CM | POA: Diagnosis not present

## 2020-08-02 DIAGNOSIS — S52592A Other fractures of lower end of left radius, initial encounter for closed fracture: Secondary | ICD-10-CM | POA: Diagnosis not present

## 2020-08-02 DIAGNOSIS — W010XXA Fall on same level from slipping, tripping and stumbling without subsequent striking against object, initial encounter: Secondary | ICD-10-CM | POA: Diagnosis not present

## 2020-08-02 DIAGNOSIS — R030 Elevated blood-pressure reading, without diagnosis of hypertension: Secondary | ICD-10-CM | POA: Diagnosis not present

## 2020-08-02 DIAGNOSIS — I10 Essential (primary) hypertension: Secondary | ICD-10-CM | POA: Diagnosis not present

## 2020-08-02 DIAGNOSIS — E119 Type 2 diabetes mellitus without complications: Secondary | ICD-10-CM | POA: Diagnosis not present

## 2020-08-02 DIAGNOSIS — S52572A Other intraarticular fracture of lower end of left radius, initial encounter for closed fracture: Secondary | ICD-10-CM | POA: Diagnosis not present

## 2020-08-02 DIAGNOSIS — S52502A Unspecified fracture of the lower end of left radius, initial encounter for closed fracture: Secondary | ICD-10-CM | POA: Diagnosis not present

## 2020-08-02 DIAGNOSIS — Z7984 Long term (current) use of oral hypoglycemic drugs: Secondary | ICD-10-CM | POA: Diagnosis not present

## 2020-08-02 DIAGNOSIS — Z79899 Other long term (current) drug therapy: Secondary | ICD-10-CM | POA: Diagnosis not present

## 2020-08-05 DIAGNOSIS — S52502A Unspecified fracture of the lower end of left radius, initial encounter for closed fracture: Secondary | ICD-10-CM | POA: Diagnosis not present

## 2020-08-13 DIAGNOSIS — M25532 Pain in left wrist: Secondary | ICD-10-CM | POA: Diagnosis not present

## 2020-08-13 DIAGNOSIS — S52502D Unspecified fracture of the lower end of left radius, subsequent encounter for closed fracture with routine healing: Secondary | ICD-10-CM | POA: Diagnosis not present

## 2020-09-04 DIAGNOSIS — M65312 Trigger thumb, left thumb: Secondary | ICD-10-CM | POA: Diagnosis not present

## 2020-09-04 DIAGNOSIS — M25532 Pain in left wrist: Secondary | ICD-10-CM | POA: Diagnosis not present

## 2020-09-04 DIAGNOSIS — S52502D Unspecified fracture of the lower end of left radius, subsequent encounter for closed fracture with routine healing: Secondary | ICD-10-CM | POA: Diagnosis not present

## 2020-09-08 DIAGNOSIS — E1140 Type 2 diabetes mellitus with diabetic neuropathy, unspecified: Secondary | ICD-10-CM | POA: Diagnosis not present

## 2020-09-08 DIAGNOSIS — L97512 Non-pressure chronic ulcer of other part of right foot with fat layer exposed: Secondary | ICD-10-CM | POA: Diagnosis not present

## 2020-09-08 DIAGNOSIS — B351 Tinea unguium: Secondary | ICD-10-CM | POA: Diagnosis not present

## 2020-09-08 DIAGNOSIS — Z89411 Acquired absence of right great toe: Secondary | ICD-10-CM | POA: Diagnosis not present

## 2020-09-08 DIAGNOSIS — Z89412 Acquired absence of left great toe: Secondary | ICD-10-CM | POA: Diagnosis not present

## 2020-09-24 DIAGNOSIS — L97512 Non-pressure chronic ulcer of other part of right foot with fat layer exposed: Secondary | ICD-10-CM | POA: Diagnosis not present

## 2020-09-24 DIAGNOSIS — E1142 Type 2 diabetes mellitus with diabetic polyneuropathy: Secondary | ICD-10-CM | POA: Diagnosis not present

## 2020-10-15 DIAGNOSIS — L97512 Non-pressure chronic ulcer of other part of right foot with fat layer exposed: Secondary | ICD-10-CM | POA: Diagnosis not present

## 2020-10-15 DIAGNOSIS — E1142 Type 2 diabetes mellitus with diabetic polyneuropathy: Secondary | ICD-10-CM | POA: Diagnosis not present

## 2020-11-05 DIAGNOSIS — E1142 Type 2 diabetes mellitus with diabetic polyneuropathy: Secondary | ICD-10-CM | POA: Diagnosis not present

## 2020-11-05 DIAGNOSIS — L97512 Non-pressure chronic ulcer of other part of right foot with fat layer exposed: Secondary | ICD-10-CM | POA: Diagnosis not present

## 2022-06-01 ENCOUNTER — Encounter: Payer: Self-pay | Admitting: Family Medicine

## 2022-06-10 ENCOUNTER — Ambulatory Visit: Payer: Self-pay | Admitting: Family Medicine
